# Patient Record
Sex: Male | Born: 1965 | Race: White | Hispanic: No | Marital: Married | Smoking: Former smoker
Health system: Southern US, Community
[De-identification: ages and names within clinical notes are randomized; demographics above are authoritative.]

## PROBLEM LIST (undated history)

## (undated) DIAGNOSIS — Z6841 Body Mass Index (BMI) 40.0 and over, adult: Secondary | ICD-10-CM

## (undated) DIAGNOSIS — L932 Other local lupus erythematosus: Secondary | ICD-10-CM

## (undated) DIAGNOSIS — I350 Nonrheumatic aortic (valve) stenosis: Secondary | ICD-10-CM

## (undated) DIAGNOSIS — I209 Angina pectoris, unspecified: Secondary | ICD-10-CM

## (undated) DIAGNOSIS — M25569 Pain in unspecified knee: Secondary | ICD-10-CM

## (undated) DIAGNOSIS — I10 Essential (primary) hypertension: Secondary | ICD-10-CM

## (undated) DIAGNOSIS — M7989 Other specified soft tissue disorders: Secondary | ICD-10-CM

## (undated) DIAGNOSIS — R011 Cardiac murmur, unspecified: Secondary | ICD-10-CM

## (undated) DIAGNOSIS — I2510 Atherosclerotic heart disease of native coronary artery without angina pectoris: Secondary | ICD-10-CM

## (undated) HISTORY — DX: Pain in unspecified knee: M25.569

## (undated) HISTORY — PX: WRIST SURGERY: SHX841

## (undated) HISTORY — DX: Other local lupus erythematosus: L93.2

## (undated) HISTORY — PX: FEMUR SURGERY: SHX943

## (undated) HISTORY — DX: Other specified soft tissue disorders: M79.89

## (undated) HISTORY — DX: Essential (primary) hypertension: I10

---

## 2018-03-15 DIAGNOSIS — M25569 Pain in unspecified knee: Secondary | ICD-10-CM | POA: Insufficient documentation

## 2018-03-15 DIAGNOSIS — I1A0 Resistant hypertension: Secondary | ICD-10-CM

## 2018-03-15 DIAGNOSIS — M7989 Other specified soft tissue disorders: Secondary | ICD-10-CM

## 2018-03-15 DIAGNOSIS — I10 Essential (primary) hypertension: Secondary | ICD-10-CM

## 2018-03-15 HISTORY — DX: Essential (primary) hypertension: I10

## 2018-03-15 HISTORY — DX: Other specified soft tissue disorders: M79.89

## 2018-03-15 HISTORY — DX: Pain in unspecified knee: M25.569

## 2018-03-15 HISTORY — DX: Resistant hypertension: I1A.0

## 2018-03-22 NOTE — Progress Notes (Signed)
Cardiology Office Note:    Date:  03/23/2018   ID:  Antonio Riley, DOB 05/27/1965, MRN [id number]  PCP:  Baldo Ash, FNP  Cardiologist:  Norman Herrlich, MD   Referring MD: Baldo Ash, FNP  ASSESSMENT:    1. Resistant hypertension   2. Nonrheumatic aortic valve stenosis   3. Swelling of lower leg   4. Screening cholesterol level    PLAN:    In order of problems listed above:  1. Poorly controlled switch to a more potent beta-blocker distal diuretic added routine labs checked.  Purchase a cuff to record blood pressure at home. 2. Physical examination clinical context suggest asymptomatic severe aortic stenosis likely congenital bicuspid with a history of murmur 3 years ago.  Echocardiogram ordered also at risk for hypertrophic cardiomyopathy initiate endocarditis prophylaxis  Next   3 weeks   Medication Adjustments/Labs and Tests Ordered: Current medicines are reviewed at length with the patient today.  Concerns regarding medicines are outlined above.  No orders of the defined types were placed in this encounter.  No orders of the defined types were placed in this encounter.    Chief Complaint  Patient presents with  . Heart Murmur    History of Present Illness:    Antonio Riley is a 52 y.o. male who is being seen today for the evaluation of hypertension at the request of Baldo Ash, FNP. 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  During the years he tells me his blood pressure is never been controlled and typically runs in the range of 170 /110.  Is told 3 years ago he had a heart murmur but he is never had evaluation fortunately is asymptomatic no exercise intolerance chest pain dyspnea or syncope he has no edema orthopnea.  He has a family history of valvular heart disease but it is his stepfather and he died after endocarditis and open heart surgery.  The goal examination suggest either  significant valvular aortic stenosis likely bicuspid or dynamic LV outflow outflow tract obstruction from hypertension.  Endocarditis prophylaxis echocardiogram ordered and I will alter his blood pressure medications as he is very poorly controlled he is resistant hypertension he has switch from week beta-blocker atenolol to potent carvedilol and I will add Spironolactone for aldosterone excess is had no lab work in over a year we will check baseline labs including a CBC renal function liver function lipid profile.  Past Medical History:  Diagnosis Date  . Hypertension 03/15/2018  . Knee pain 03/15/2018  . Swelling of lower leg 03/15/2018    Past Surgical History:  Procedure Laterality Date  . FEMUR SURGERY    . WRIST SURGERY      Current Medications: Current Meds  Medication Sig  . Calcium Carbonate-Vitamin D (CALCIUM 600/VITAMIN D PO) Take 1 tablet by mouth daily.  . hydrochlorothiazide (HYDRODIURIL) 25 MG tablet Take 25 mg by mouth every morning.  Marland Kitchen lisinopril (PRINIVIL,ZESTRIL) 20 MG tablet Take 20 mg by mouth daily.  Marland Kitchen omeprazole (PRILOSEC OTC) 20 MG tablet Take 20 mg by mouth daily.  Marland Kitchen OVER THE COUNTER MEDICATION Take 1 tablet by mouth daily. Allergy Medicine  . [DISCONTINUED] atenolol (TENORMIN) 100 MG tablet Take 100 mg by mouth daily.     Allergies:   Coricidin hbp cough-cold [chlorpheniramine-dm]   Social History   Socioeconomic History  . Marital status: Married    Spouse name: Not on file  . Number of  children: Not on file  . Years of education: Not on file  . Highest education level: Not on file  Occupational History  . Not on file  Social Needs  . Financial resource strain: Not on file  . Food insecurity:    Worry: Not on file    Inability: Not on file  . Transportation needs:    Medical: Not on file    Non-medical: Not on file  Tobacco Use  . Smoking status: Current Every Day Smoker    Packs/day: 1.00    Years: 28.00    Pack years: 28.00    Types:  Cigarettes  . Smokeless tobacco: Never Used  Substance and Sexual Activity  . Alcohol use: Yes    Comment: occ beer   . Drug use: Not Currently  . Sexual activity: Not on file  Lifestyle  . Physical activity:    Days per week: Not on file    Minutes per session: Not on file  . Stress: Not on file  Relationships  . Social connections:    Talks on phone: Not on file    Gets together: Not on file    Attends religious service: Not on file    Active member of club or organization: Not on file    Attends meetings of clubs or organizations: Not on file    Relationship status: Not on file  Other Topics Concern  . Not on file  Social History Narrative  . Not on file     Family History: The patient's family history includes CAD in his mother; Deep vein thrombosis in his mother; Diabetes in his cousin and mother; Heart murmur in his mother.  ROS:   Review of Systems  Constitution: Negative.  HENT: Negative.   Eyes: Negative.   Cardiovascular: Negative.   Respiratory: Negative.   Endocrine: Negative.   Hematologic/Lymphatic: Negative.   Skin: Negative.   Musculoskeletal: Negative.   Gastrointestinal: Negative.   Genitourinary: Negative.   Neurological: Negative.   Psychiatric/Behavioral: Negative.   Allergic/Immunologic: Negative.    Please see the history of present illness.     All other systems reviewed and are negative.  EKGs/Labs/Other Studies Reviewed:    The following studies were reviewed today:   EKG:  EKG is  ordered today.  The ekg ordered today demonstrates SRTH LVH and repolarization  Recent Labs: No results found for requested labs within last 8760 hours.  Recent Lipid Panel No results found for: CHOL, TRIG, HDL, CHOLHDL, VLDL, LDLCALC, LDLDIRECT  Physical Exam:    VS:  BP (!) 202/108 (BP Location: Left Arm, Patient Position: Sitting, Cuff Size: Large)   Pulse (!) 53   Ht 6\' 1"  (1.854 m)   Wt (!) 337 lb 6.4 oz (153 kg)   SpO2 98%   BMI 44.51 kg/m      Wt Readings from Last 3 Encounters:  03/23/18 (!) 337 lb 6.4 oz (153 kg)     GEN:  Well nourished, well developed in no acute distress HEENT: Normal NECK: No JVD; No carotid bruits LYMPHATICS: No lymphadenopathy CARDIAC: There is a grade 3/6 harsh grunting Solik murmur it runs from the apex through the aortic area into the right innominate and subclavian and up into the carotids S2 is single peaks very late all the hallmarks of severe aortic stenosis or left ventricular outflow tract obstruction dynamic.  RRR, no rubs, gallops RESPIRATORY:  Clear to auscultation without rales, wheezing or rhonchi  ABDOMEN: Soft, non-tender, non-distended MUSCULOSKELETAL:  No  edema; No deformity  SKIN: Warm and dry NEUROLOGIC:  Alert and oriented x 3 PSYCHIATRIC:  Normal affect     Signed, Norman Herrlich, MD  03/23/2018 10:42 AM    [facility]

## 2018-03-23 ENCOUNTER — Encounter: Payer: Self-pay | Admitting: Cardiology

## 2018-03-23 ENCOUNTER — Ambulatory Visit: Payer: 59 | Admitting: Cardiology

## 2018-03-23 VITALS — BP 202/108 | HR 53 | Ht 73.0 in | Wt 337.4 lb

## 2018-03-23 DIAGNOSIS — Z13220 Encounter for screening for lipoid disorders: Secondary | ICD-10-CM

## 2018-03-23 DIAGNOSIS — I350 Nonrheumatic aortic (valve) stenosis: Secondary | ICD-10-CM

## 2018-03-23 DIAGNOSIS — I10 Essential (primary) hypertension: Secondary | ICD-10-CM | POA: Diagnosis not present

## 2018-03-23 DIAGNOSIS — Z6841 Body Mass Index (BMI) 40.0 and over, adult: Secondary | ICD-10-CM | POA: Insufficient documentation

## 2018-03-23 MED ORDER — SPIRONOLACTONE 25 MG PO TABS: 25.0000 mg | ORAL_TABLET | Freq: Every day | ORAL | 3 refills | Status: DC

## 2018-03-23 MED ORDER — AMOXICILLIN 500 MG PO CAPS: ORAL_CAPSULE | ORAL | 0 refills | Status: DC

## 2018-03-23 MED ORDER — CARVEDILOL 12.5 MG PO TABS: 12.5000 mg | ORAL_TABLET | Freq: Two times a day (BID) | ORAL | 3 refills | Status: DC

## 2018-03-23 NOTE — Patient Instructions (Addendum)
Medication Instructions:  Your physician has recommended you make the following change in your medication:  STOP atenolol  START amoxicillin 500 mg: Take 4 capsules (2,000 mg) 45 minutes before dental procedures START carvedilol (coreg) 12.5 mg: Take 1 tablet twice daily START spironolactone (aldactone) 25 mg: Take 1 tablet daily   If you need a refill on your cardiac medications before your next appointment, please call your pharmacy.   Lab work: Your physician recommends that you return for lab work today: CMP, Lipid panel, CBC.   If you have labs (blood work) drawn today and your tests are completely normal, you will receive your results only by: Marland Kitchen MyChart Message (if you have MyChart) OR . A paper copy in the mail If you have any lab test that is abnormal or we need to change your treatment, we will call you to review the results.  Testing/Procedures: You had an EKG today.   Your physician has requested that you have an echocardiogram. Echocardiography is a painless test that uses sound waves to create images of your heart. It provides your doctor with information about the size and shape of your heart and how well your heart's chambers and valves are working. This procedure takes approximately one hour. There are no restrictions for this procedure.  Follow-Up: At [facility], you and your health needs are our priority.  As part of our continuing mission to provide you with exceptional heart care, we have created designated Provider Care Teams.  These Care Teams include your primary Cardiologist (physician) and Advanced Practice Providers (APPs -  Physician Assistants and Nurse Practitioners) who all work together to provide you with the care you need, when you need it. You will need a follow up appointment in 3 weeks.      DASH diet: Healthy eating to lower your blood pressure The DASH diet emphasizes portion size, eating a variety of foods and getting the right amount of  nutrients. Discover how DASH can improve your health and lower your blood pressure. By [facility] Staff  DASH stands for Dietary Approaches to Stop Hypertension. The DASH diet is a lifelong approach to healthy eating that's designed to help treat or prevent high blood pressure (hypertension). The DASH diet encourages you to reduce the sodium in your diet and eat a variety of foods rich in nutrients that help lower blood pressure, such as potassium, calcium and magnesium. By following the DASH diet, you may be able to reduce your blood pressure by a few points in just two weeks. Over time, your systolic blood pressure could drop by eight to 14 points, which can make a significant difference in your health risks. Because the DASH diet is a healthy way of eating, it offers health benefits besides just lowering blood pressure. The DASH diet is also in line with dietary recommendations to prevent osteoporosis, cancer, heart disease, stroke and diabetes. DASH diet: Sodium levels The DASH diet emphasizes vegetables, fruits and low-fat dairy foods - and moderate amounts of whole grains, fish, poultry and nuts. In addition to the standard DASH diet, there is also a lower sodium version of the diet. You can choose the version of the diet that meets your health needs: Standard DASH diet. You can consume up to 2,300 milligrams (mg) of sodium a day.  Lower sodium DASH diet. You can consume up to 1,500 mg of sodium a day. Both versions of the DASH diet aim to reduce the amount of sodium in your diet compared with  what you might get in a typical American diet, which can amount to a whopping 3,400 mg of sodium a day or more. The standard DASH diet meets the recommendation from the Dietary Guidelines for Americans to keep daily sodium intake to less than 2,300 mg a day. The American Heart Association recommends 1,500 mg a day of sodium as an upper limit for all adults. If you aren't sure what sodium level is right for  you, talk to your doctor. DASH diet: What to eat Both versions of the DASH diet include lots of whole grains, fruits, vegetables and low-fat dairy products. The DASH diet also includes some fish, poultry and legumes, and encourages a small amount of nuts and seeds a few times a week.  You can eat red meat, sweets and fats in small amounts. The DASH diet is low in saturated fat, cholesterol and total fat. Here's a look at the recommended servings from each food group for the 2,000-calorie-a-day DASH diet. Grains: 6 to 8 servings a day Grains include bread, cereal, rice and pasta. Examples of one serving of grains include 1 slice whole-wheat bread, 1 ounce dry cereal, or 1/2 cup cooked cereal, rice or pasta. Focus on whole grains because they have more fiber and nutrients than do refined grains. For instance, use brown rice instead of white rice, whole-wheat pasta instead of regular pasta and whole-grain bread instead of white bread. Look for products labeled "100 percent whole grain" or "100 percent whole wheat."  Grains are naturally low in fat. Keep them this way by avoiding butter, cream and cheese sauces. Vegetables: 4 to 5 servings a day Tomatoes, carrots, broccoli, sweet potatoes, greens and other vegetables are full of fiber, vitamins, and such minerals as potassium and magnesium. Examples of one serving include 1 cup raw leafy green vegetables or 1/2 cup cut-up raw or cooked vegetables. Don't think of vegetables only as side dishes - a hearty blend of vegetables served over brown rice or whole-wheat noodles can serve as the main dish for a meal.  Fresh and frozen vegetables are both good choices. When buying frozen and canned vegetables, choose those labeled as low sodium or without added salt.  To increase the number of servings you fit in daily, be creative. In a stir-fry, for instance, cut the amount of meat in half and double up on the vegetables. Fruits: 4 to 5 servings a day Many fruits  need little preparation to become a healthy part of a meal or snack. Like vegetables, they're packed with fiber, potassium and magnesium and are typically low in fat - coconuts are an exception. Examples of one serving include one medium fruit, 1/2 cup fresh, frozen or canned fruit, or 4 ounces of juice. Have a piece of fruit with meals and one as a snack, then round out your day with a dessert of fresh fruits topped with a dollop of low-fat yogurt.  Leave on edible peels whenever possible. The peels of apples, pears and most fruits with pits add interesting texture to recipes and contain healthy nutrients and fiber.  Remember that citrus fruits and juices, such as grapefruit, can interact with certain medications, so check with your doctor or pharmacist to see if they're OK for you.  If you choose canned fruit or juice, make sure no sugar is added. Dairy: 2 to 3 servings a day Milk, yogurt, cheese and other dairy products are major sources of calcium, vitamin D and protein. But the key is to make sure  that you choose dairy products that are low fat or fat-free because otherwise they can be a major source of fat - and most of it is saturated. Examples of one serving include 1 cup skim or 1 percent milk, 1 cup low fat yogurt, or 1 1/2 ounces part-skim cheese. Low-fat or fat-free frozen yogurt can help you boost the amount of dairy products you eat while offering a sweet treat. Add fruit for a healthy twist.  If you have trouble digesting dairy products, choose lactose-free products or consider taking an over-the-counter product that contains the enzyme lactase, which can reduce or prevent the symptoms of lactose intolerance.  Go easy on regular and even fat-free cheeses because they are typically high in sodium. Lean meat, poultry and fish: 6 servings or fewer a day Meat can be a rich source of protein, B vitamins, iron and zinc. Choose lean varieties and aim for no more than 6 ounces a day. Cutting back  on your meat portion will allow room for more vegetables. Trim away skin and fat from poultry and meat and then bake, broil, grill or roast instead of frying in fat.  Eat heart-healthy fish, such as salmon, herring and tuna. These types of fish are high in omega-3 fatty acids, which can help lower your total cholesterol. Nuts, seeds and legumes: 4 to 5 servings a week Almonds, sunflower seeds, kidney beans, peas, lentils and other foods in this family are good sources of magnesium, potassium and protein. They're also full of fiber and phytochemicals, which are plant compounds that may protect against some cancers and cardiovascular disease. Serving sizes are small and are intended to be consumed only a few times a week because these foods are high in calories. Examples of one serving include 1/3 cup nuts, 2 tablespoons seeds, or 1/2 cup cooked beans or peas.  Nuts sometimes get a bad rap because of their fat content, but they contain healthy types of fat - monounsaturated fat and omega-3 fatty acids. They're high in calories, however, so eat them in moderation. Try adding them to stir-fries, salads or cereals.  Soybean-based products, such as tofu and tempeh, can be a good alternative to meat because they contain all of the amino acids your body needs to make a complete protein, just like meat. Fats and oils: 2 to 3 servings a day Fat helps your body absorb essential vitamins and helps your body's immune system. But too much fat increases your risk of heart disease, diabetes and obesity. The DASH diet strives for a healthy balance by limiting total fat to less than 30 percent of daily calories from fat, with a focus on the healthier monounsaturated fats. Examples of one serving include 1 teaspoon soft margarine, 1 tablespoon mayonnaise or 2 tablespoons salad dressing. Saturated fat and trans fat are the main dietary culprits in increasing your risk of coronary artery disease. DASH helps keep your daily  saturated fat to less than 6 percent of your total calories by limiting use of meat, butter, cheese, whole milk, cream and eggs in your diet, along with foods made from lard, solid shortenings, and palm and coconut oils.  Avoid trans fat, commonly found in such processed foods as crackers, baked goods and fried items.  Read food labels on margarine and salad dressing so that you can choose those that are lowest in saturated fat and free of trans fat. Sweets: 5 servings or fewer a week You don't have to banish sweets entirely while following the DASH  diet - just go easy on them. Examples of one serving include 1 tablespoon sugar, jelly or jam, 1/2 cup sorbet, or 1 cup lemonade. When you eat sweets, choose those that are fat-free or low-fat, such as sorbets, fruit ices, jelly beans, hard candy, graham crackers or low-fat cookies.  Artificial sweeteners such as aspartame (NutraSweet, Equal) and sucralose (Splenda) may help satisfy your sweet tooth while sparing the sugar. But remember that you still must use them sensibly. It's OK to swap a diet cola for a regular cola, but not in place of a more nutritious beverage such as low-fat milk or even plain water.  Cut back on added sugar, which has no nutritional value but can pack on calories. DASH diet: Alcohol and caffeine Drinking too much alcohol can increase blood pressure. The Dietary Guidelines for Americans recommends that men limit alcohol to no more than two drinks a day and women to one or less. The DASH diet doesn't address caffeine consumption. The influence of caffeine on blood pressure remains unclear. But caffeine can cause your blood pressure to rise at least temporarily. If you already have high blood pressure or if you think caffeine is affecting your blood pressure, talk to your doctor about your caffeine consumption. DASH diet and weight loss While the DASH diet is not a weight-loss program, you may indeed lose unwanted pounds because it can  help guide you toward healthier food choices. The DASH diet generally includes about 2,000 calories a day. If you're trying to lose weight, you may need to eat fewer calories. You may also need to adjust your serving goals based on your individual circumstances - something your health care team can help you decide. Tips to cut back on sodium The foods at the core of the DASH diet are naturally low in sodium. So just by following the DASH diet, you're likely to reduce your sodium intake. You also reduce sodium further by: Using sodium-free spices or flavorings with your food instead of salt  Not adding salt when cooking rice, pasta or hot cereal  Rinsing canned foods to remove some of the sodium  Buying foods labeled "no salt added," "sodium-free," "low sodium" or "very low sodium" One teaspoon of table salt has 2,325 mg of sodium. When you read food labels, you may be surprised at just how much sodium some processed foods contain. Even low-fat soups, canned vegetables, ready-to-eat cereals and sliced [place] from the local deli - foods you may have considered healthy - often have lots of sodium. You may notice a difference in taste when you choose low-sodium food and beverages. If things seem too bland, gradually introduce low-sodium foods and cut back on table salt until you reach your sodium goal. That'll give your palate time to adjust. Using salt-free seasoning blends or herbs and spices may also ease the transition. It can take several weeks for your taste buds to get used to less salty foods. Putting the pieces of the DASH diet together Try these strategies to get started on the DASH diet:  Change gradually. If you now eat only one or two servings of fruits or vegetables a day, try to add a serving at lunch and one at dinner. Rather than switching to all whole grains, start by making one or two of your grain servings whole grains. Increasing fruits, vegetables and whole grains gradually can also  help prevent bloating or diarrhea that may occur if you aren't used to eating a diet with lots of fiber.  You can also try over-the-counter products to help reduce gas from beans and vegetables.  Reward successes and forgive slip-ups. Reward yourself with a nonfood treat for your accomplishments - rent a movie, purchase a book or get together with a friend. Everyone slips, especially when learning something new. Remember that changing your lifestyle is a long-term process. Find out what triggered your setback and then just pick up where you left off with the DASH diet.  Add physical activity. To boost your blood pressure lowering efforts even more, consider increasing your physical activity in addition to following the DASH diet. Combining both the DASH diet and physical activity makes it more likely that you'll reduce your blood pressure.  Get support if you need it. If you're having trouble sticking to your diet, talk to your doctor or dietitian about it. You might get some tips that will help you stick to the DASH diet. Remember, healthy eating isn't an all-or-nothing proposition. What's most important is that, on average, you eat healthier foods with plenty of variety - both to keep your diet nutritious and to avoid boredom or extremes. And with the DASH diet, you can have both.     Echocardiogram An echocardiogram, or echocardiography, uses sound waves (ultrasound) to produce an image of your heart. The echocardiogram is simple, painless, obtained within a short period of time, and offers valuable information to your health care provider. The images from an echocardiogram can provide information such as:  Evidence of coronary artery disease (CAD).  Heart size.  Heart muscle function.  Heart valve function.  Aneurysm detection.  Evidence of a past heart attack.  Fluid buildup around the heart.  Heart muscle thickening.  Assess heart valve function.  Tell a health care provider  about:  Any allergies you have.  All medicines you are taking, including vitamins, herbs, eye drops, creams, and over-the-counter medicines.  Any problems you or family members have had with anesthetic medicines.  Any blood disorders you have.  Any surgeries you have had.  Any medical conditions you have.  Whether you are pregnant or may be pregnant. What happens before the procedure? No special preparation is needed. Eat and drink normally. What happens during the procedure?  In order to produce an image of your heart, gel will be applied to your chest and a wand-like tool (transducer) will be moved over your chest. The gel will help transmit the sound waves from the transducer. The sound waves will harmlessly bounce off your heart to allow the heart images to be captured in real-time motion. These images will then be recorded.  You may need an IV to receive a medicine that improves the quality of the pictures. What happens after the procedure? You may return to your normal schedule including diet, activities, and medicines, unless your health care provider tells you otherwise. This information is not intended to replace advice given to you by your health care provider. Make sure you discuss any questions you have with your health care provider. Document Released: 04/30/2000 Document Revised: 12/20/2015 Document Reviewed: 01/08/2013 Elsevier Interactive Patient Education  2017 [employer].

## 2018-03-24 LAB — COMPREHENSIVE METABOLIC PANEL
ALT: 29 IU/L (ref 0–44)
AST: 26 IU/L (ref 0–40)
Albumin/Globulin Ratio: 1.5 (ref 1.2–2.2)
Albumin: 4.3 g/dL (ref 3.5–5.5)
Alkaline Phosphatase: 50 IU/L (ref 39–117)
BILIRUBIN TOTAL: 0.8 mg/dL (ref 0.0–1.2)
BUN/Creatinine Ratio: 13 (ref 9–20)
BUN: 15 mg/dL (ref 6–24)
CALCIUM: 9.4 mg/dL (ref 8.7–10.2)
CHLORIDE: 97 mmol/L (ref 96–106)
CO2: 23 mmol/L (ref 20–29)
Creatinine, Ser: 1.12 mg/dL (ref 0.76–1.27)
GFR, EST AFRICAN AMERICAN: 87 mL/min/{1.73_m2} (ref >59)
GFR, EST NON AFRICAN AMERICAN: 76 mL/min/{1.73_m2} (ref >59)
GLUCOSE: 105 mg/dL — AB (ref 65–99)
Globulin, Total: 2.9 g/dL (ref 1.5–4.5)
Potassium: 5 mmol/L (ref 3.5–5.2)
Sodium: 136 mmol/L (ref 134–144)
TOTAL PROTEIN: 7.2 g/dL (ref 6.0–8.5)

## 2018-03-24 LAB — CBC
Hematocrit: 46.2 % (ref 37.5–51.0)
Hemoglobin: 15.3 g/dL (ref 13.0–17.7)
MCH: 29.2 pg (ref 26.6–33.0)
MCHC: 33.1 g/dL (ref 31.5–35.7)
MCV: 88 fL (ref 79–97)
Platelets: 271 10*3/uL (ref 150–450)
RBC: 5.24 x10E6/uL (ref 4.14–5.80)
RDW: 12.4 % (ref 12.3–15.4)
WBC: 8 10*3/uL (ref 3.4–10.8)

## 2018-03-24 LAB — LIPID PANEL
Chol/HDL Ratio: 4.2 ratio (ref 0.0–5.0)
Cholesterol, Total: 159 mg/dL (ref 100–199)
HDL: 38 mg/dL — AB (ref >39)
LDL Calculated: 103 mg/dL — ABNORMAL HIGH (ref 0–99)
TRIGLYCERIDES: 90 mg/dL (ref 0–149)
VLDL CHOLESTEROL CAL: 18 mg/dL (ref 5–40)

## 2018-03-27 ENCOUNTER — Ambulatory Visit (HOSPITAL_BASED_OUTPATIENT_CLINIC_OR_DEPARTMENT_OTHER): Payer: 59

## 2018-03-30 ENCOUNTER — Other Ambulatory Visit: Payer: Self-pay | Admitting: Cardiology

## 2018-03-30 MED ORDER — HYDROCHLOROTHIAZIDE 25 MG PO TABS: 25.0000 mg | ORAL_TABLET | Freq: Every morning | ORAL | 1 refills | Status: DC

## 2018-03-30 MED ORDER — LISINOPRIL 20 MG PO TABS: 20.0000 mg | ORAL_TABLET | Freq: Every day | ORAL | 1 refills | Status: DC

## 2018-03-30 NOTE — Telephone Encounter (Signed)
° ° °  1. Which medications need to be refilled? (please list name of each medication and dose if known) lisinopril 20mg ,  Hydrochlorothiazide 25mg   2. Which pharmacy/location (including street and city if local pharmacy) is medication to be sent to? [facility] in [place]  3. Do they need a 30 day or 90 day supply? 90

## 2018-03-30 NOTE — Telephone Encounter (Signed)
Hydrochlorothiazide 25 mg daily and lisinopril 20 mg daily refilled

## 2018-04-24 NOTE — Progress Notes (Deleted)
Cardiology Office Note:    Date:  04/24/2018   ID:  Antonio Riley, DOB [birth year], MRN [id number]  PCP:  Baldo Ash, FNP  Cardiologist:  Norman Herrlich, MD    Referring MD: Baldo Ash, FNP    ASSESSMENT:    No diagnosis found. PLAN:    In order of problems listed above:  1. ***   Next appointment: ***   Medication Adjustments/Labs and Tests Ordered: Current medicines are reviewed at length with the patient today.  Concerns regarding medicines are outlined above.  No orders of the defined types were placed in this encounter.  No orders of the defined types were placed in this encounter.   No chief complaint on file.   History of Present Illness:   1 Antonio Riley is a 52 y.o. male with a hx of uncontrolled hypertension and aortic murmur last seen one month ago. Compliance with diet, lifestyle and medications: *** Past Medical History:  Diagnosis Date  . Hypertension 03/15/2018  . Knee pain 03/15/2018  . Swelling of lower leg 03/15/2018    Past Surgical History:  Procedure Laterality Date  . FEMUR SURGERY    . WRIST SURGERY      Current Medications: No outpatient medications have been marked as taking for the 04/25/18 encounter (Appointment) with Baldo Daub, MD.     Allergies:   Coricidin hbp cough-cold [chlorpheniramine-dm]   Social History   Socioeconomic History  . Marital status: Married    Spouse name: Not on file  . Number of children: Not on file  . Years of education: Not on file  . Highest education level: Not on file  Occupational History  . Not on file  Social Needs  . Financial resource strain: Not on file  . Food insecurity:    Worry: Not on file    Inability: Not on file  . Transportation needs:    Medical: Not on file    Non-medical: Not on file  Tobacco Use  . Smoking status: Current Every Day Smoker    Packs/day: 1.00    Years: 28.00    Pack years: 28.00    Types: Cigarettes  . Smokeless tobacco:  Never Used  Substance and Sexual Activity  . Alcohol use: Yes    Comment: occ beer   . Drug use: Not Currently  . Sexual activity: Not on file  Lifestyle  . Physical activity:    Days per week: Not on file    Minutes per session: Not on file  . Stress: Not on file  Relationships  . Social connections:    Talks on phone: Not on file    Gets together: Not on file    Attends religious service: Not on file    Active member of club or organization: Not on file    Attends meetings of clubs or organizations: Not on file    Relationship status: Not on file  Other Topics Concern  . Not on file  Social History Narrative  . Not on file     Family History: The patient's ***family history includes CAD in his mother; Deep vein thrombosis in his mother; Diabetes in his cousin and mother; Heart murmur in his mother. ROS:   Please see the history of present illness.    All other systems reviewed and are negative.  EKGs/Labs/Other Studies Reviewed:    The following studies were reviewed today:  EKG:  EKG ordered today.  The ekg ordered today demonstrates ***  Recent  Labs: 03/23/2018: ALT 29; BUN 15; Creatinine, Ser 1.12; Hemoglobin 15.3; Platelets 271; Potassium 5.0; Sodium 136  Recent Lipid Panel    Component Value Date/Time   CHOL 159 03/23/2018 1104   TRIG 90 03/23/2018 1104   HDL 38 (L) 03/23/2018 1104   CHOLHDL 4.2 03/23/2018 1104   LDLCALC 103 (H) 03/23/2018 1104    Physical Exam:    VS:  There were no vitals taken for this visit.    Wt Readings from Last 3 Encounters:  03/23/18 (!) 337 lb 6.4 oz (153 kg)     GEN: *** Well nourished, well developed in no acute distress HEENT: Normal NECK: No JVD; No carotid bruits LYMPHATICS: No lymphadenopathy CARDIAC: ***RRR, no murmurs, rubs, gallops RESPIRATORY:  Clear to auscultation without rales, wheezing or rhonchi  ABDOMEN: Soft, non-tender, non-distended MUSCULOSKELETAL:  No edema; No deformity  SKIN: Warm and  dry NEUROLOGIC:  Alert and oriented x 3 PSYCHIATRIC:  Normal affect    Signed, Norman HerrlichBrian Sharonann Malbrough, MD  04/24/2018 12:34 PM    [facility]

## 2018-04-25 ENCOUNTER — Ambulatory Visit: Payer: 59 | Admitting: Cardiology

## 2018-04-25 DIAGNOSIS — R0989 Other specified symptoms and signs involving the circulatory and respiratory systems: Secondary | ICD-10-CM

## 2018-05-25 ENCOUNTER — Ambulatory Visit: Payer: 59 | Admitting: Cardiology

## 2018-06-16 ENCOUNTER — Other Ambulatory Visit: Payer: Self-pay | Admitting: Cardiology

## 2018-06-17 ENCOUNTER — Other Ambulatory Visit: Payer: Self-pay | Admitting: Cardiology

## 2018-09-17 ENCOUNTER — Other Ambulatory Visit: Payer: Self-pay | Admitting: Cardiology

## 2018-09-18 ENCOUNTER — Other Ambulatory Visit: Payer: Self-pay | Admitting: Cardiology

## 2018-09-19 NOTE — Telephone Encounter (Signed)
Rx refill sent to pharmacy. Note to pharmacy: pt needs office visit before further refills.

## 2018-09-20 ENCOUNTER — Other Ambulatory Visit: Payer: Self-pay | Admitting: Cardiology

## 2018-10-11 ENCOUNTER — Telehealth: Payer: Self-pay

## 2018-10-11 ENCOUNTER — Other Ambulatory Visit: Payer: Self-pay | Admitting: Cardiology

## 2018-10-11 DIAGNOSIS — I10 Essential (primary) hypertension: Secondary | ICD-10-CM

## 2018-10-11 NOTE — Telephone Encounter (Signed)
Patient informed he must be seen in office for additional medication refills and that Dr. Dulce Sellar has ordered labs to be drawn.    Virtual visit scheduled 10/16/18 at 1050 am. Pt would like to get labs drawn at [facility] location and wonders if he could have an office visit instead of virtual visit on Monday.  Pt told will check with Dr. Dulce Sellar if office visit would be OK.  pls advise, tx

## 2018-10-13 NOTE — Progress Notes (Deleted)
{Choose 1 Note Type (Telehealth Visit or Telephone Visit):[phone]}   Date:  10/13/2018   ID:  Antonio Riley, DOB Sep 17, 1965, MRN [id number]  {Patient Location:[phone]::"Home"} {Provider Location:[phone]::"Home"}  PCP:  Baldo Ash, FNP  Cardiologist:  No primary care provider on file. *** Electrophysiologist:  None   Evaluation Performed:  {Choose Visit [phone]::"Follow-Up Visit"}  Chief Complaint:  ***  History of Present Illness:    Antonio Riley is a 53 y.o. male with resistant hypertension and a heart murmur last seen 03/24/19.  At the time of his last visit he was scheduled for an echocardiogram and his EF to be performed.  The patient {does/does not:200015} have symptoms concerning for COVID-19 infection (fever, chills, cough, or new shortness of breath).    Past Medical History:  Diagnosis Date  . Hypertension 03/15/2018  . Knee pain 03/15/2018  . Swelling of lower leg 03/15/2018   Past Surgical History:  Procedure Laterality Date  . FEMUR SURGERY    . WRIST SURGERY       No outpatient medications have been marked as taking for the 10/16/18 encounter (Appointment) with Baldo Daub, MD.     Allergies:   Coricidin hbp cough-cold [chlorpheniramine-dm]   Social History   Tobacco Use  . Smoking status: Current Every Day Smoker    Packs/day: 1.00    Years: 28.00    Pack years: 28.00    Types: Cigarettes  . Smokeless tobacco: Never Used  Substance Use Topics  . Alcohol use: Yes    Comment: occ beer   . Drug use: Not Currently     Family Hx: The patient's family history includes CAD in his mother; Deep vein thrombosis in his mother; Diabetes in his cousin and mother; Heart murmur in his mother.  ROS:   Please see the history of present illness.    *** All other systems reviewed and are negative.   Prior CV studies:   The following studies were reviewed today:  ***  Labs/Other Tests and Data Reviewed:    EKG:  An  ECG dated 04/02/2018 was personally reviewed today and demonstrated:  Sinus rhythm inferior T wave abnormality  Recent Labs: 03/23/2018: ALT 29; BUN 15; Creatinine, Ser 1.12; Hemoglobin 15.3; Platelets 271; Potassium 5.0; Sodium 136   Recent Lipid Panel Lab Results  Component Value Date/Time   CHOL 159 03/23/2018 11:04 AM   TRIG 90 03/23/2018 11:04 AM   HDL 38 (L) 03/23/2018 11:04 AM   CHOLHDL 4.2 03/23/2018 11:04 AM   LDLCALC 103 (H) 03/23/2018 11:04 AM    Wt Readings from Last 3 Encounters:  03/23/18 (!) 337 lb 6.4 oz (153 kg)     Objective:    Vital Signs:  There were no vitals taken for this visit.   {HeartCare Virtual Exam (Optional):[phone]::"VITAL SIGNS:  reviewed"}  ASSESSMENT & PLAN:    1. ***  COVID-19 Education: The signs and symptoms of COVID-19 were discussed with the patient and how to seek care for testing (follow up with PCP or arrange E-visit).  ***The importance of social distancing was discussed today.  Time:   Today, I have spent *** minutes with the patient with telehealth technology discussing the above problems.     Medication Adjustments/Labs and Tests Ordered: Current medicines are reviewed at length with the patient today.  Concerns regarding medicines are outlined above.   Tests Ordered: No orders of the defined types were placed in this encounter.   Medication Changes: No orders of the defined types  were placed in this encounter.   Disposition:  Follow up {follow up:15908}  Signed, Norman HerrlichBrian , MD  10/13/2018 4:23 PM    [facility]

## 2018-10-16 ENCOUNTER — Telehealth: Payer: 59 | Admitting: Cardiology

## 2018-10-16 ENCOUNTER — Other Ambulatory Visit: Payer: Self-pay

## 2018-10-18 ENCOUNTER — Other Ambulatory Visit: Payer: Self-pay

## 2018-10-18 ENCOUNTER — Telehealth: Payer: Self-pay

## 2018-10-18 ENCOUNTER — Other Ambulatory Visit: Payer: Self-pay | Admitting: Cardiology

## 2018-10-18 MED ORDER — LISINOPRIL 20 MG PO TABS: 20.0000 mg | ORAL_TABLET | Freq: Every day | ORAL | 0 refills | Status: DC

## 2018-11-10 ENCOUNTER — Other Ambulatory Visit: Payer: Self-pay | Admitting: Cardiology

## 2018-11-10 NOTE — Telephone Encounter (Signed)
Spironolactone refill denied due to non-compliance with follow-up visits.

## 2018-12-11 ENCOUNTER — Other Ambulatory Visit: Payer: Self-pay | Admitting: Cardiology

## 2018-12-11 NOTE — Telephone Encounter (Signed)
Rx refill sent to pharmacy. 

## 2018-12-20 ENCOUNTER — Other Ambulatory Visit: Payer: Self-pay | Admitting: Cardiology

## 2019-01-18 ENCOUNTER — Other Ambulatory Visit: Payer: Self-pay | Admitting: Cardiology

## 2019-01-27 ENCOUNTER — Other Ambulatory Visit: Payer: Self-pay | Admitting: Cardiology

## 2019-02-06 ENCOUNTER — Other Ambulatory Visit: Payer: Self-pay | Admitting: Cardiology

## 2019-03-09 ENCOUNTER — Other Ambulatory Visit: Payer: Self-pay | Admitting: Cardiology

## 2019-03-13 ENCOUNTER — Other Ambulatory Visit: Payer: Self-pay | Admitting: Cardiology

## 2019-04-12 ENCOUNTER — Other Ambulatory Visit: Payer: Self-pay | Admitting: Cardiology

## 2019-04-16 NOTE — Telephone Encounter (Signed)
Hctz and Spironolactone 2 week refills sent. Overdue for follow-up visit.

## 2019-04-17 ENCOUNTER — Other Ambulatory Visit: Payer: Self-pay | Admitting: Cardiology

## 2019-04-29 NOTE — Progress Notes (Signed)
Cardiology Office Note:    Date:  04/30/2019   ID:  Antonio Riley, DOB 12-Apr-1966, MRN [id number]  PCP:  Sarajane Jews, FNP  Cardiologist:  Shirlee More, MD    Referring MD: Sarajane Jews, FNP    ASSESSMENT:    1. Resistant hypertension   2. Heart murmur, systolic    PLAN:    In order of problems listed above:  1. Improved we will resume his beta-blocker should be at target then check renal function lipid profile. 2. He needs an echocardiogram exam is consistent with moderate aortic stenosis   Next appointment: 6 months   Medication Adjustments/Labs and Tests Ordered: Current medicines are reviewed at length with the patient today.  Concerns regarding medicines are outlined above.  No orders of the defined types were placed in this encounter.  No orders of the defined types were placed in this encounter.   Chief Complaint  Patient presents with  . Follow-up  . Hypertension    And medication refills    History of Present Illness:    Antonio Riley is a 53 y.o. male with a hx of resistant hypertension and murmur of aortic stenosis last seen 03/23/2018.  Seen [facility] ED 07/17/2018 with rectal bleeding.  He was discharged for outpatient care Compliance with diet, lifestyle and medications: Yes  Last year he did not do the echocardiogram due to cost.  [facility] and will try to schedule in the freestanding [place] office hopefully at a different price structure.  No chest pain edema shortness of breath palpitation or syncope.  He has been off his beta-blocker for a month blood pressure running in the range of 160/80.  Resume his beta-blocker check labs and echocardiogram has been ordered he has a murmur suggesting aortic stenosis but is asymptomatic. Past Medical History:  Diagnosis Date  . Hypertension 03/15/2018  . Knee pain 03/15/2018  . Swelling of lower leg 03/15/2018    Past Surgical History:  Procedure Laterality Date    . FEMUR SURGERY    . WRIST SURGERY      Current Medications: Current Meds  Medication Sig  . Calcium Carbonate-Vitamin D (CALCIUM 600/VITAMIN D PO) Take 1 tablet by mouth daily.  . hydrochlorothiazide (HYDRODIURIL) 25 MG tablet Take 1 tablet (25 mg total) by mouth daily. NO ADDITIONAL REFILLS UNTIL SEEN BY PROVIDER  . lisinopril (ZESTRIL) 20 MG tablet Take 1 tablet (20 mg total) by mouth daily.  Marland Kitchen omeprazole (PRILOSEC OTC) 20 MG tablet Take 20 mg by mouth daily.  Marland Kitchen OVER THE COUNTER MEDICATION Take 1 tablet by mouth daily. Allergy Medicine  . spironolactone (ALDACTONE) 25 MG tablet Take 1 tablet (25 mg total) by mouth daily. NO MORE REFILLS UNTIL SEEN BY PROVIDER     Allergies:   Coricidin hbp cough-cold [chlorpheniramine-dm]   Social History   Socioeconomic History  . Marital status: Married    Spouse name: Not on file  . Number of children: Not on file  . Years of education: Not on file  . Highest education level: Not on file  Occupational History  . Not on file  Tobacco Use  . Smoking status: Current Every Day Smoker    Packs/day: 1.00    Years: 28.00    Pack years: 28.00    Types: Cigarettes  . Smokeless tobacco: Never Used  Substance and Sexual Activity  . Alcohol use: Yes    Comment: occ beer   . Drug use: Not Currently  .  Sexual activity: Not on file  Other Topics Concern  . Not on file  Social History Narrative  . Not on file   Social Determinants of Health   Financial Resource Strain:   . Difficulty of Paying Living Expenses: Not on file  Food Insecurity:   . Worried About Charity fundraiser in the Last Year: Not on file  . Ran Out of Food in the Last Year: Not on file  Transportation Needs:   . Lack of Transportation (Medical): Not on file  . Lack of Transportation (Non-Medical): Not on file  Physical Activity:   . Days of Exercise per Week: Not on file  . Minutes of Exercise per Session: Not on file  Stress:   . Feeling of Stress : Not on file   Social Connections:   . Frequency of Communication with Friends and Family: Not on file  . Frequency of Social Gatherings with Friends and Family: Not on file  . Attends Religious Services: Not on file  . Active Member of Clubs or Organizations: Not on file  . Attends Archivist Meetings: Not on file  . Marital Status: Not on file     Family History: The patient's family history includes CAD in his mother; Deep vein thrombosis in his mother; Diabetes in his cousin and mother; Heart murmur in his mother. ROS:   Please see the history of present illness.    All other systems reviewed and are negative.  EKGs/Labs/Other Studies Reviewed:    The following studies were reviewed today:  EKG:  EKG ordered today and personally reviewed.  The ekg ordered today demonstrates sinus rhythm 2 APC otherwise normal  Recent Labs: 07/17/2018: Hemoglobin 16.1 count 242,000 potassium 4.0 creatinine 1.11 ESR 76 cc/min Recent Lipid Panel    Component Value Date/Time   CHOL 159 03/23/2018 1104   TRIG 90 03/23/2018 1104   HDL 38 (L) 03/23/2018 1104   CHOLHDL 4.2 03/23/2018 1104   LDLCALC 103 (H) 03/23/2018 1104    Physical Exam:    VS:  BP (!) 162/86   Pulse 85   Ht _0  (1.854 m)   Wt (!) 319 lb (144.7 kg)   BMI 42.09 kg/m     Wt Readings from Last 3 Encounters:  04/30/19 (!) 319 lb (144.7 kg)  03/23/18 (!) 337 lb 6.4 oz (153 kg)     GEN:  Well nourished, well developed in no acute distress HEENT: Normal NECK: No JVD; No carotid bruits LYMPHATICS: No lymphadenopathy CARDIAC: RRR, grade 2/6 to 3/6 systolic ejection murmur harsh seems to encompass S2 radiates to the base of the carotids no AR, rubs, gallops RESPIRATORY:  Clear to auscultation without rales, wheezing or rhonchi  ABDOMEN: Soft, non-tender, non-distended MUSCULOSKELETAL:  No edema; No deformity  SKIN: Warm and dry NEUROLOGIC:  Alert and oriented x 3 PSYCHIATRIC:  Normal affect    Signed, Shirlee More, MD   04/30/2019 11:51 AM    [place]

## 2019-04-30 ENCOUNTER — Encounter: Payer: Self-pay | Admitting: Cardiology

## 2019-04-30 ENCOUNTER — Ambulatory Visit (INDEPENDENT_AMBULATORY_CARE_PROVIDER_SITE_OTHER): Payer: 59 | Admitting: Cardiology

## 2019-04-30 ENCOUNTER — Other Ambulatory Visit: Payer: Self-pay

## 2019-04-30 VITALS — BP 162/86 | HR 85 | Ht 73.0 in | Wt 319.0 lb

## 2019-04-30 DIAGNOSIS — I10 Essential (primary) hypertension: Secondary | ICD-10-CM | POA: Diagnosis not present

## 2019-04-30 DIAGNOSIS — R011 Cardiac murmur, unspecified: Secondary | ICD-10-CM

## 2019-04-30 DIAGNOSIS — Z13220 Encounter for screening for lipoid disorders: Secondary | ICD-10-CM

## 2019-04-30 DIAGNOSIS — I350 Nonrheumatic aortic (valve) stenosis: Secondary | ICD-10-CM | POA: Diagnosis not present

## 2019-04-30 DIAGNOSIS — M7989 Other specified soft tissue disorders: Secondary | ICD-10-CM

## 2019-04-30 MED ORDER — LISINOPRIL 20 MG PO TABS: 20.0000 mg | ORAL_TABLET | Freq: Every day | ORAL | 1 refills | Status: DC

## 2019-04-30 MED ORDER — CARVEDILOL 25 MG PO TABS: 12.5000 mg | ORAL_TABLET | Freq: Two times a day (BID) | ORAL | 1 refills | Status: DC

## 2019-04-30 MED ORDER — SPIRONOLACTONE 25 MG PO TABS: 25.0000 mg | ORAL_TABLET | Freq: Every day | ORAL | 1 refills | Status: DC

## 2019-04-30 MED ORDER — HYDROCHLOROTHIAZIDE 25 MG PO TABS: 25.0000 mg | ORAL_TABLET | Freq: Every day | ORAL | 1 refills | Status: DC

## 2019-04-30 NOTE — Patient Instructions (Signed)
Medication Instructions:  Your physician has recommended you make the following change in your medication:   INCREASE carvedilol (coreg) 25 mg: Take 1 tablet twice daily  *If you need a refill on your cardiac medications before your next appointment, please call your pharmacy*  Lab Work: Your physician recommends that you return for lab work today: lipid panel, CMP, ProBNP.   If you have labs (blood work) drawn today and your tests are completely normal, you will receive your results only by: Marland Kitchen MyChart Message (if you have MyChart) OR . A paper copy in the mail If you have any lab test that is abnormal or we need to change your treatment, we will call you to review the results.  Testing/Procedures: You had an EKG today.   Your physician has requested that you have an echocardiogram. Echocardiography is a painless test that uses sound waves to create images of your heart. It provides your doctor with information about the size and shape of your heart and how well your heart's chambers and valves are working. This procedure takes approximately one hour. There are no restrictions for this procedure.  Follow-Up: At [facility], you and your health needs are our priority.  As part of our continuing mission to provide you with exceptional heart care, we have created designated Provider Care Teams.  These Care Teams include your primary Cardiologist (physician) and Advanced Practice Providers (APPs -  Physician Assistants and Nurse Practitioners) who all work together to provide you with the care you need, when you need it.  Your next appointment:   6 month(s)  The format for your next appointment:   In Person  Provider:   Shirlee More, MD    Echocardiogram An echocardiogram is a procedure that uses painless sound waves (ultrasound) to produce an image of the heart. Images from an echocardiogram can provide important information about:  Signs of coronary artery disease  (CAD).  Aneurysm detection. An aneurysm is a weak or damaged part of an artery wall that bulges out from the normal force of blood pumping through the body.  Heart size and shape. Changes in the size or shape of the heart can be associated with certain conditions, including heart failure, aneurysm, and CAD.  Heart muscle function.  Heart valve function.  Signs of a past heart attack.  Fluid buildup around the heart.  Thickening of the heart muscle.  A tumor or infectious growth around the heart valves. Tell a health care provider about:  Any allergies you have.  All medicines you are taking, including vitamins, herbs, eye drops, creams, and over-the-counter medicines.  Any blood disorders you have.  Any surgeries you have had.  Any medical conditions you have.  Whether you are pregnant or may be pregnant. What are the risks? Generally, this is a safe procedure. However, problems may occur, including:  Allergic reaction to dye (contrast) that may be used during the procedure. What happens before the procedure? No specific preparation is needed. You may eat and drink normally. What happens during the procedure?   An IV tube may be inserted into one of your veins.  You may receive contrast through this tube. A contrast is an injection that improves the quality of the pictures from your heart.  A gel will be applied to your chest.  A wand-like tool (transducer) will be moved over your chest. The gel will help to transmit the sound waves from the transducer.  The sound waves will harmlessly bounce off of  your heart to allow the heart images to be captured in real-time motion. The images will be recorded on a computer. The procedure may vary among health care providers and hospitals. What happens after the procedure?  You may return to your normal, everyday life, including diet, activities, and medicines, unless your health care provider tells you not to do  that. Summary  An echocardiogram is a procedure that uses painless sound waves (ultrasound) to produce an image of the heart.  Images from an echocardiogram can provide important information about the size and shape of your heart, heart muscle function, heart valve function, and fluid buildup around your heart.  You do not need to do anything to prepare before this procedure. You may eat and drink normally.  After the echocardiogram is completed, you may return to your normal, everyday life, unless your health care provider tells you not to do that. This information is not intended to replace advice given to you by your health care provider. Make sure you discuss any questions you have with your health care provider. Document Released: 04/30/2000 Document Revised: 08/24/2018 Document Reviewed: 06/05/2016 Elsevier Patient Education  2020 [employer].

## 2019-05-01 LAB — COMPREHENSIVE METABOLIC PANEL
ALT: 20 IU/L (ref 0–44)
AST: 21 IU/L (ref 0–40)
Albumin/Globulin Ratio: 1.5 (ref 1.2–2.2)
Albumin: 4.5 g/dL (ref 3.8–4.9)
Alkaline Phosphatase: 62 IU/L (ref 39–117)
BUN/Creatinine Ratio: 13 (ref 9–20)
BUN: 12 mg/dL (ref 6–24)
Bilirubin Total: 0.8 mg/dL (ref 0.0–1.2)
CO2: 18 mmol/L — ABNORMAL LOW (ref 20–29)
Calcium: 9.8 mg/dL (ref 8.7–10.2)
Chloride: 94 mmol/L — ABNORMAL LOW (ref 96–106)
Creatinine, Ser: 0.96 mg/dL (ref 0.76–1.27)
GFR calc Af Amer: 105 mL/min/{1.73_m2} (ref >59)
GFR calc non Af Amer: 91 mL/min/{1.73_m2} (ref >59)
Globulin, Total: 3.1 g/dL (ref 1.5–4.5)
Glucose: 97 mg/dL (ref 65–99)
Potassium: 4.2 mmol/L (ref 3.5–5.2)
Sodium: 131 mmol/L — ABNORMAL LOW (ref 134–144)
Total Protein: 7.6 g/dL (ref 6.0–8.5)

## 2019-05-01 LAB — LIPID PANEL
Chol/HDL Ratio: 3.8 ratio (ref 0.0–5.0)
Cholesterol, Total: 173 mg/dL (ref 100–199)
HDL: 45 mg/dL (ref >39)
LDL Chol Calc (NIH): 107 mg/dL — ABNORMAL HIGH (ref 0–99)
Triglycerides: 117 mg/dL (ref 0–149)
VLDL Cholesterol Cal: 21 mg/dL (ref 5–40)

## 2019-05-01 LAB — PRO B NATRIURETIC PEPTIDE: NT-Pro BNP: 86 pg/mL (ref 0–121)

## 2019-06-22 ENCOUNTER — Other Ambulatory Visit: Payer: Self-pay | Admitting: Cardiology

## 2019-06-22 MED ORDER — CARVEDILOL 25 MG PO TABS: 25.0000 mg | ORAL_TABLET | Freq: Two times a day (BID) | ORAL | 1 refills | Status: DC

## 2019-06-22 NOTE — Telephone Encounter (Signed)
Medication refilled

## 2019-06-22 NOTE — Telephone Encounter (Signed)
*  STAT* If patient is at the pharmacy, call can be transferred to refill team.   1. Which medications need to be refilled? (please list name of each medication and dose if known)  carvedilol (COREG) 25 MG tablet  Pt takes 25 mg 2x daily   2. Which pharmacy/location (including street and city if local pharmacy) is medication to be sent to?  [id number] - [place], [place] - [address]  3. Do they need a 30 day or 90 day supply? 90  Patient will [facility] dosage and instructions. He was instructed at his last office visit to double the dosage of his medication , but the rx stayed the same, so he has run out faster than anticipated.

## 2019-06-26 ENCOUNTER — Other Ambulatory Visit: Payer: Self-pay

## 2019-06-26 ENCOUNTER — Ambulatory Visit (INDEPENDENT_AMBULATORY_CARE_PROVIDER_SITE_OTHER): Payer: 59

## 2019-06-26 DIAGNOSIS — M7989 Other specified soft tissue disorders: Secondary | ICD-10-CM

## 2019-06-26 DIAGNOSIS — R011 Cardiac murmur, unspecified: Secondary | ICD-10-CM

## 2019-06-26 DIAGNOSIS — I10 Essential (primary) hypertension: Secondary | ICD-10-CM

## 2019-06-26 DIAGNOSIS — I350 Nonrheumatic aortic (valve) stenosis: Secondary | ICD-10-CM

## 2019-06-26 NOTE — Progress Notes (Signed)
Complete echocardiogram has been performed.  Avelardo Aldrich Lloyd RDCS, RVT 

## 2019-06-28 ENCOUNTER — Telehealth: Payer: Self-pay | Admitting: Cardiology

## 2019-06-28 NOTE — Telephone Encounter (Signed)
Pt verbalized understanding of his Echo results... will froward to a [facility] to schedule hm with Dr. Dulce Sellar in [place] in the next week or so.

## 2019-06-28 NOTE — Telephone Encounter (Signed)
New message   Patient is returning call for Echo results. Please call. 

## 2019-07-10 NOTE — Progress Notes (Signed)
Cardiology Office Note:    Date:  07/11/2019   ID:  Antonio Riley, DOB 04-25-1966, MRN [id number]  PCP:  Baldo Ash, FNP  Cardiologist:  Norman Herrlich, MD    Referring MD: Baldo Ash, FNP    ASSESSMENT:    No diagnosis found. PLAN:    In order of problems listed above:  1. Aortic stenosis severe clinically bicuspid valve I cannot identify an echocardiogram he is symptomatic stage D and with his young age I think he is best served with surgical AVR he is interested in exploring whether TAVR is an option and I think it is best for him to have an office appointment to meet Dr. Cornelius Moras and discuss the disease natural course and treatment choices.  The patient is agreeable and will schedule our office today.  Also check a LP(a) level with its association with aortic stenosis although his lipid profile is favorable 2. Resistant hypertension much improved at target and continue current treatment with addition of MRA and ACE inhibitor check renal function potassium today   Next appointment:  6 weeks   Medication Adjustments/Labs and Tests Ordered: Current medicines are reviewed at length with the patient today.  Concerns regarding medicines are outlined above.  No orders of the defined types were placed in this encounter.  No orders of the defined types were placed in this encounter.   Chief Complaint  Patient presents with  . Follow-up    History of Present Illness:    Antonio Riley is a 54 y.o. male with a hx of  resistant hypertension and murmur of aortic stenosis last seen 04/30/2019.  Had a [facility] ED visit 07/17/2018 with rectal bleeding discharged to outpatient care  Compliance with diet, lifestyle and medications: Yes  After his last visit he had an echocardiogram performed 06/26/2019-moderate concentric LVH normal ejection fraction 65 to 70% a thickened calcified and restricted aortic valve with mild to moderate regurgitation and severe  stenosis peak and mean gradients 80 and 43 mm mercury VTI ratio 0.27 and valve area 0.85 cm.  The visualized thoracic aorta was normal in size.  I reviewed the echocardiogram independently,  the valve morphology cannot be defined, but clinically he has bicuspid aortic stenosis with early in life heart murmur.  This was a good visit we have a chance to sit down and go over his echocardiogram ordered.  He understands he has severe aortic stenosis and we discussed the natural history and symptoms he has had some exertional chest tightness and what he is really notices a decrease in his functional ability he fatigues easily.  He has no family history of a bicuspid aortic valve and has a lifelong history of heart murmur.  He asked me about options for correction and with his age I think he is likely served best with surgical AVR but I told him the best thing for him to do is to make an appointment with cardiothoracic surgery and review his choices.  We will do lab work today for renal function also check a LP(a) level his lipid profile is favorable.  His hypertension is now well controlled. Past Medical History:  Diagnosis Date  . Hypertension 03/15/2018  . Knee pain 03/15/2018  . Swelling of lower leg 03/15/2018    Past Surgical History:  Procedure Laterality Date  . FEMUR SURGERY    . WRIST SURGERY      Current Medications: Current Meds  Medication Sig  . amoxicillin (AMOXIL) 500 MG capsule Take  4 capsules (2,000 mg) 45 minutes before dental procedures.  . [place]-Glucosamine-Vit D (OSTEO BI-FLEX ONE PER DAY PO) Take by mouth.  . Calcium Carbonate-Vitamin D (CALCIUM 600/VITAMIN D PO) Take 1 tablet by mouth daily.  . carvedilol (COREG) 25 MG tablet Take 1 tablet (25 mg total) by mouth 2 (two) times daily.  . Cholecalciferol 25 MCG (1000 UT) tablet Take by mouth.  . hydrochlorothiazide (HYDRODIURIL) 25 MG tablet Take 1 tablet (25 mg total) by mouth daily.  Marland Kitchen lisinopril (ZESTRIL) 20 MG tablet  Take 1 tablet (20 mg total) by mouth daily.  Marland Kitchen omeprazole (PRILOSEC OTC) 20 MG tablet Take 20 mg by mouth daily.  Marland Kitchen OVER THE COUNTER MEDICATION Take 1 tablet by mouth daily. Allergy Medicine  . spironolactone (ALDACTONE) 25 MG tablet Take 1 tablet (25 mg total) by mouth daily.     Allergies:   Coricidin hbp cough-cold [chlorpheniramine-dm]   Social History   Socioeconomic History  . Marital status: Married    Spouse name: Not on file  . Number of children: Not on file  . Years of education: Not on file  . Highest education level: Not on file  Occupational History  . Not on file  Tobacco Use  . Smoking status: Current Every Day Smoker    Packs/day: 1.00    Years: 28.00    Pack years: 28.00    Types: Cigarettes  . Smokeless tobacco: Never Used  Substance and Sexual Activity  . Alcohol use: Yes    Comment: occ beer   . Drug use: Not Currently  . Sexual activity: Not on file  Other Topics Concern  . Not on file  Social History Narrative  . Not on file   Social Determinants of Health   Financial Resource Strain:   . Difficulty of Paying Living Expenses: Not on file  Food Insecurity:   . Worried About Programme researcher, broadcasting/film/video in the Last Year: Not on file  . Ran Out of Food in the Last Year: Not on file  Transportation Needs:   . Lack of Transportation (Medical): Not on file  . Lack of Transportation (Non-Medical): Not on file  Physical Activity:   . Days of Exercise per Week: Not on file  . Minutes of Exercise per Session: Not on file  Stress:   . Feeling of Stress : Not on file  Social Connections:   . Frequency of Communication with Friends and Family: Not on file  . Frequency of Social Gatherings with Friends and Family: Not on file  . Attends Religious Services: Not on file  . Active Member of Clubs or Organizations: Not on file  . Attends Banker Meetings: Not on file  . Marital Status: Not on file     Family History: The patient's family history  includes CAD in his mother; Deep vein thrombosis in his mother; Diabetes in his cousin and mother; Heart murmur in his mother. ROS:   Please see the history of present illness.    All other systems reviewed and are negative.  EKGs/Labs/Other Studies Reviewed:    The following studies were reviewed today:  Recent Labs: 04/30/2019: ALT 20; BUN 12; Creatinine, Ser 0.96; NT-Pro BNP 86; Potassium 4.2; Sodium 131  Recent Lipid Panel    Component Value Date/Time   CHOL 173 04/30/2019 1215   TRIG 117 04/30/2019 1215   HDL 45 04/30/2019 1215   CHOLHDL 3.8 04/30/2019 1215   LDLCALC 107 (H) 04/30/2019 1215    Physical  Exam:    VS:  BP 128/88   Pulse 71   Ht 6\' 1"  (1.854 m)   Wt (!) 329 lb (149.2 kg)   SpO2 97%   BMI 43.41 kg/m     Wt Readings from Last 3 Encounters:  07/11/19 (!) 329 lb (149.2 kg)  04/30/19 (!) 319 lb (144.7 kg)  03/23/18 (!) 337 lb 6.4 oz (153 kg)     GEN:  Well nourished, well developed in no acute distress HEENT: Normal NECK: No JVD; No carotid bruits LYMPHATICS: No lymphadenopathy CARDIAC: He has a quite harsh holosystolic murmur encompasses S2 radiates to the carotids I cannot auscultate aortic regurgitation RRR, no murmurs, rubs, gallops RESPIRATORY:  Clear to auscultation without rales, wheezing or rhonchi  ABDOMEN: Soft, non-tender, non-distended MUSCULOSKELETAL:  No edema; No deformity  SKIN: Warm and dry NEUROLOGIC:  Alert and oriented x 3 PSYCHIATRIC:  Normal affect    Signed, Shirlee More, MD  07/11/2019 10:08 AM    [place]

## 2019-07-11 ENCOUNTER — Encounter: Payer: Self-pay | Admitting: *Deleted

## 2019-07-11 ENCOUNTER — Ambulatory Visit (INDEPENDENT_AMBULATORY_CARE_PROVIDER_SITE_OTHER): Payer: 59 | Admitting: Cardiology

## 2019-07-11 ENCOUNTER — Other Ambulatory Visit: Payer: Self-pay

## 2019-07-11 ENCOUNTER — Encounter: Payer: Self-pay | Admitting: Cardiology

## 2019-07-11 VITALS — BP 128/88 | HR 71 | Ht 73.0 in | Wt 329.0 lb

## 2019-07-11 DIAGNOSIS — I350 Nonrheumatic aortic (valve) stenosis: Secondary | ICD-10-CM | POA: Diagnosis not present

## 2019-07-11 NOTE — Patient Instructions (Addendum)
Medication Instructions:  Your physician recommends that you continue on your current medications as directed. Please refer to the Current Medication list given to you today.  *If you need a refill on your cardiac medications before your next appointment, please call your pharmacy*  Lab Work: Your physician recommends that you return for lab work in: TODAY BMP,Lipoprotein A  If you have labs (blood work) drawn today and your tests are completely normal, you will receive your results only by: Marland Kitchen MyChart Message (if you have MyChart) OR . A paper copy in the mail If you have any lab test that is abnormal or we need to change your treatment, we will call you to review the results.  Testing/Procedures: None  Follow-Up: At [facility], you and your health needs are our priority.  As part of our continuing mission to provide you with exceptional heart care, we have created designated Provider Care Teams.  These Care Teams include your primary Cardiologist (physician) and Advanced Practice Providers (APPs -  Physician Assistants and Nurse Practitioners) who all work together to provide you with the care you need, when you need it.  Your next appointment:   6 week(s)  The format for your next appointment:   In Person  Provider:   Norman Herrlich, MD  Other Instructions You are being referred to Dr Cornelius Moras for your aortic stenosis. They will contact you with an appointment date and time.

## 2019-07-12 ENCOUNTER — Telehealth: Payer: Self-pay | Admitting: *Deleted

## 2019-07-12 DIAGNOSIS — I10 Essential (primary) hypertension: Secondary | ICD-10-CM

## 2019-07-12 DIAGNOSIS — E7841 Elevated Lipoprotein(a): Secondary | ICD-10-CM

## 2019-07-12 LAB — BASIC METABOLIC PANEL
BUN/Creatinine Ratio: 12 (ref 9–20)
BUN: 13 mg/dL (ref 6–24)
CO2: 26 mmol/L (ref 20–29)
Calcium: 10 mg/dL (ref 8.7–10.2)
Chloride: 97 mmol/L (ref 96–106)
Creatinine, Ser: 1.1 mg/dL (ref 0.76–1.27)
GFR calc Af Amer: 89 mL/min/{1.73_m2} (ref >59)
GFR calc non Af Amer: 77 mL/min/{1.73_m2} (ref >59)
Glucose: 131 mg/dL — ABNORMAL HIGH (ref 65–99)
Potassium: 5.9 mmol/L — ABNORMAL HIGH (ref 3.5–5.2)
Sodium: 135 mmol/L (ref 134–144)

## 2019-07-12 LAB — LIPOPROTEIN A (LPA): Lipoprotein (a): 103.6 nmol/L — ABNORMAL HIGH (ref <75.0)

## 2019-07-12 MED ORDER — ROSUVASTATIN CALCIUM 5 MG PO TABS: 5.0000 mg | ORAL_TABLET | Freq: Every day | ORAL | 1 refills | Status: DC

## 2019-07-12 NOTE — Telephone Encounter (Signed)
Patient informed and verbalized understanding of plan. Copy sent to PCP 

## 2019-07-12 NOTE — Telephone Encounter (Signed)
-----   Message from Baldo Daub, MD sent at 07/12/2019  8:01 AM EST ----- Normal or stable result  His potassium is mildly elevated, unfortunately he needs to have a repeat BMP done and sometimes this can be seen from handling the specimen disruption of breath sounds but also can be seen at times with the medications he is taking for his blood pressure.  The lipoprotein a level is elevated this is associated with more rapid progression of a bicuspid aortic valve and despite his lipid profile being good he should start on a low-dose of statin rosuvastatin 5 mg daily dispense 3 refills and have a follow-up lipid profile in about 6 to 8 weeks

## 2019-07-19 ENCOUNTER — Encounter: Payer: 59 | Admitting: Thoracic Surgery (Cardiothoracic Vascular Surgery)

## 2019-07-20 NOTE — Progress Notes (Signed)
This encounter was created in error - please disregard.

## 2019-07-23 ENCOUNTER — Other Ambulatory Visit: Payer: Self-pay | Admitting: Thoracic Surgery (Cardiothoracic Vascular Surgery)

## 2019-07-23 ENCOUNTER — Other Ambulatory Visit: Payer: Self-pay

## 2019-07-23 ENCOUNTER — Encounter: Payer: Self-pay | Admitting: Thoracic Surgery (Cardiothoracic Vascular Surgery)

## 2019-07-23 ENCOUNTER — Institutional Professional Consult (permissible substitution): Payer: 59 | Admitting: Thoracic Surgery (Cardiothoracic Vascular Surgery)

## 2019-07-23 ENCOUNTER — Encounter: Payer: 59 | Admitting: Thoracic Surgery (Cardiothoracic Vascular Surgery)

## 2019-07-23 VITALS — BP 128/82 | HR 73 | Temp 98.1°F | Resp 20 | Ht 73.0 in | Wt 325.0 lb

## 2019-07-23 DIAGNOSIS — I350 Nonrheumatic aortic (valve) stenosis: Secondary | ICD-10-CM

## 2019-07-23 NOTE — Progress Notes (Signed)
[facility]  MULTIDISCIPLINARY HEART VALVE CLINIC  CARDIOTHORACIC SURGERY CONSULTATION REPORT  Referring Provider is Munley, Brian J, MD Primary Cardiologist is Brian Munley, MD PCP is Martin, Michael, FNP  Chief Complaint  Patient presents with  . Aortic Stenosis    Surgical eval, ECHO 07/06/19    HPI:  Patient is 54-year-old morbidly obese white male with history of hypertension who has been referred for surgical consultation to discuss treatment options for management of severe symptomatic aortic stenosis.  Patient states that he was first noted to have a heart murmur several years ago by his primary care physician.  He was referred for cardiology consultation and evaluated by Dr. Munley November 2019.  Transthoracic echocardiogram was ordered but never performed.  Patient states that he did not go through with echocardiogram because of the high associated out-of-pocket expense.  The patient was recently seen in follow-up by Dr. Munley and transthoracic echocardiogram eventually performed June 26, 2019.  Echocardiogram revealed normal left ventricular systolic function with likely bicuspid aortic valve and severe aortic stenosis.  Cardiothoracic surgical consultation was requested.  Patient is married and lives locally in [place] with his wife and one of his adult children.  He works full-time as a machinist.  He lives a somewhat sedentary lifestyle but reports no significant physical limitation other than that of exertional shortness of breath.  He states that he has had symptoms of exertional shortness of breath and chest discomfort for at least 3 or 4 years.  Symptoms have continued to gradually progress.  He now gets short of breath with moderate level activity.  Symptoms are sometimes associated with tightness across the chest.  Symptoms are always relieved by rest.  He denies nocturnal chest pain or shortness of breath.  He reports occasional mild dizzy spells with no  history of syncope.  He has had some mild lower extremity edema.  He denies history of PND, orthopnea, or lower extremity edema.  Past Medical History:  Diagnosis Date  . Hypertension 03/15/2018  . Knee pain 03/15/2018  . Swelling of lower leg 03/15/2018    Past Surgical History:  Procedure Laterality Date  . FEMUR SURGERY    . WRIST SURGERY      Family History  Problem Relation Age of Onset  . CAD Mother   . Deep vein thrombosis Mother   . Heart murmur Mother   . Diabetes Mother   . Diabetes Cousin     Social History   Socioeconomic History  . Marital status: Married    Spouse name: Not on file  . Number of children: Not on file  . Years of education: Not on file  . Highest education level: Not on file  Occupational History  . Not on file  Tobacco Use  . Smoking status: Current Every Day Smoker    Packs/day: 1.00    Years: 28.00    Pack years: 28.00    Types: Cigarettes  . Smokeless tobacco: Never Used  Substance and Sexual Activity  . Alcohol use: Yes    Comment: occ beer   . Drug use: Not Currently  . Sexual activity: Not on file  Other Topics Concern  . Not on file  Social History Narrative  . Not on file   Social Determinants of Health   Financial Resource Strain:   . Difficulty of Paying Living Expenses: Not on file  Food Insecurity:   . Worried About Running Out of Food in the Last Year: Not on file  .   Ran Out of Food in the Last Year: Not on file  Transportation Needs:   . Lack of Transportation (Medical): Not on file  . Lack of Transportation (Non-Medical): Not on file  Physical Activity:   . Days of Exercise per Week: Not on file  . Minutes of Exercise per Session: Not on file  Stress:   . Feeling of Stress : Not on file  Social Connections:   . Frequency of Communication with Friends and Family: Not on file  . Frequency of Social Gatherings with Friends and Family: Not on file  . Attends Religious Services: Not on file  . Active Member  of Clubs or Organizations: Not on file  . Attends Banker Meetings: Not on file  . Marital Status: Not on file  Intimate Partner Violence:   . Fear of Current or Ex-Partner: Not on file  . Emotionally Abused: Not on file  . Physically Abused: Not on file  . Sexually Abused: Not on file    Current Outpatient Medications  Medication Sig Dispense Refill  . amoxicillin (AMOXIL) 500 MG capsule Take 4 capsules (2,000 mg) 45 minutes before dental procedures. 20 capsule 0  . [place]wellia-Glucosamine-Vit D (OSTEO BI-FLEX ONE PER DAY PO) Take by mouth.    . Calcium Carbonate-Vitamin D (CALCIUM 600/VITAMIN D PO) Take 1 tablet by mouth daily.    . carvedilol (COREG) 25 MG tablet Take 1 tablet (25 mg total) by mouth 2 (two) times daily. 180 tablet 1  . Cholecalciferol 25 MCG (1000 UT) tablet Take by mouth.    . hydrochlorothiazide (HYDRODIURIL) 25 MG tablet Take 1 tablet (25 mg total) by mouth daily. 90 tablet 1  . lisinopril (ZESTRIL) 20 MG tablet Take 1 tablet (20 mg total) by mouth daily. 90 tablet 1  . omeprazole (PRILOSEC OTC) 20 MG tablet Take 20 mg by mouth daily.    Marland Kitchen OVER THE COUNTER MEDICATION Take 1 tablet by mouth daily. Allergy Medicine    . spironolactone (ALDACTONE) 25 MG tablet Take 1 tablet (25 mg total) by mouth daily. 90 tablet 1  . rosuvastatin (CRESTOR) 5 MG tablet Take 1 tablet (5 mg total) by mouth daily. (Patient not taking: Reported on 07/23/2019) 90 tablet 1   No current facility-administered medications for this visit.    Allergies  Allergen Reactions  . Coricidin Hbp Cough-Cold [Chlorpheniramine-Dm] Hives      Review of Systems:   General:  normal appetite, decreased energy, no weight gain, no weight loss, no fever  Cardiac:  + chest pain with exertion, no chest pain at rest, + SOB with exertion, no resting SOB, no PND, no orthopnea, no palpitations, no arrhythmia, no atrial fibrillation, + LE edema, mild dizzy spells, no syncope  Respiratory:  + shortness  of breath, no home oxygen, no productive cough, + dry cough, no bronchitis, no wheezing, no hemoptysis, no asthma, no pain with inspiration or cough, no sleep apnea, no CPAP at night  GI:   no difficulty swallowing, + reflux, no frequent heartburn, no hiatal hernia, no abdominal pain, occasional constipation, no diarrhea, no hematochezia, no hematemesis, no melena  GU:   no dysuria,  no frequency, no urinary tract infection, no hematuria, no enlarged prostate, no kidney stones, no kidney disease  Vascular:  no pain suggestive of claudication, no pain in feet, + leg cramps, no varicose veins, no DVT, no non-healing foot ulcer  Neuro:   no stroke, no TIA's, no seizures, + headaches, no temporary blindness one eye,  no slurred speech, no peripheral neuropathy, no chronic pain, no instability of gait, no memory/cognitive dysfunction  Musculoskeletal: no arthritis, no joint swelling, no myalgias, no difficulty walking, normal mobility   Skin:   no rash, no itching, no skin infections, no pressure sores or ulcerations  Psych:   no anxiety, no depression, no nervousness, no unusual recent stress  Eyes:   no blurry vision, + floaters, no recent vision changes, + wears glasses for reading only  ENT:   no hearing loss, no loose or painful teeth, no dentures, last saw dentist within 6 months  Hematologic:  no easy bruising, no abnormal bleeding, no clotting disorder, no frequent epistaxis  Endocrine:  no diabetes, does not check CBG's at home           Physical Exam:   BP 128/82   Pulse 73   Temp 98.1 F (36.7 C) (Skin)   Resp 20   Ht 6\' 1"  (1.854 m)   Wt (!) 325 lb (147.4 kg)   SpO2 96% Comment: RA  BMI 42.88 kg/m   General:  Obese,  well-appearing  HEENT:  Unremarkable   Neck:   no JVD, no bruits, no adenopathy   Chest:   clear to auscultation, symmetrical breath sounds, no wheezes, no rhonchi   CV:   RRR, grade III/VI crescendo/decrescendo murmur heard best at RSB,  no diastolic  murmur  Abdomen:  soft, non-tender, no masses   Extremities:  warm, well-perfused, pulses diminished but, trace LE edema  Rectal/GU  Deferred  Neuro:   Grossly non-focal and symmetrical throughout  Skin:   Clean and dry, no rashes, no breakdown   Diagnostic Tests:   ECHOCARDIOGRAM REPORT       Patient Name:  Antonio Riley Full Date of Exam: 06/26/2019  Medical Rec #: 08/24/2019      Height:    73.0 in  Accession #:  [id number]     Weight:    319.0 lb  Date of Birth: [birth year]      BSA:     2.62 m  Patient Age:  53 years      BP:      162/86 mmHg  Patient Gender: M          HR:      65 bpm.  Exam Location:    Procedure: 2D Echo   Indications:  Nonrheumatic aortic valve stenosis [I35.0 (ICD-10-CM)];  Swelling         of lower leg [M79.89 (ICD-10-CM)]    History:    Patient has no prior history of Echocardiogram  examinations.         Signs/Symptoms:Murmur; Risk Factors:Hypertension.    Sonographer:  08-01-1986  Referring Phys: [phone] BRIAN J MUNLEY   IMPRESSIONS    1. Left ventricular ejection fraction, by estimation, is 65 to 70%. The  left ventricle has normal function. The left ventrical has no regional  wall motion abnormalities. There is moderately increased left ventricular  hypertrophy. Left ventricular  diastolic parameters are consistent with Grade II diastolic dysfunction  (pseudonormalization).  2. Left atrial size was mildly dilated.  3. Trivial mitral valve regurgitation.  4. The aortic valve is hickened, moderate capcification noted, restricted  mobility. Can not r/o bicuspid valve. Aortic valve regurgitation is mild  to moderate. Severe aortic valve stenosis. Peak/mean gradient : 80/43  mmHg, AVA 0.85 cm2, DI 0.27.  5. The inferior vena cava is normal in size with greater than 50%  respiratory variability, suggesting right atrial  pressure of 3 mmHg.    FINDINGS  Left Ventricle: Left ventricular ejection fraction, by estimation, is 65  to 70%. The left ventricle has normal function. The left ventricle has no  regional wall motion abnormalities. The left ventricular internal cavity  size was normal in size. There is  moderately increased left ventricular hypertrophy. Left ventricular  diastolic parameters are consistent with Grade II diastolic dysfunction  (pseudonormalization).   Right Ventricle: The right ventricular size is normal. No increase in  right ventricular wall thickness. Right ventricular systolic function is  normal. There is normal pulmonary artery systolic pressure. The tricuspid  regurgitant velocity is 2.42 m/s, and  with an assumed right atrial pressure of 3 mmHg, the estimated right  ventricular systolic pressure is 94.7 mmHg.   Left Atrium: Left atrial size was mildly dilated.   Right Atrium: Right atrial size was normal in size.   Pericardium: There is no evidence of pericardial effusion.   Mitral Valve: The mitral valve is normal in structure and function. Normal  mobility of the mitral valve leaflets. Trivial mitral valve regurgitation.  No evidence of mitral valve stenosis. MV peak gradient, 5.3 mmHg. The mean  mitral valve gradient is 3.0  mmHg.   Tricuspid Valve: The tricuspid valve is normal in structure. Tricuspid  valve regurgitation is not demonstrated. No evidence of tricuspid  stenosis.   Aortic Valve: The aortic valve is normal in structure and function.. There  is moderate thickening and moderate calcification of the aortic valve.  Aortic valve regurgitation is mild to moderate. Aortic regurgitation PHT  measures 671 msec. Severe aortic  stenosis is present. There is moderate thickening of the aortic valve.  There is moderate calcification of the aortic valve. Aortic valve mean  gradient measures 43.0 mmHg. Aortic valve peak gradient measures 80.3  mmHg. Aortic valve area, by VTI  measures  0.85 cm.   Pulmonic Valve: The pulmonic valve was normal in structure. Pulmonic valve  regurgitation is not visualized. No evidence of pulmonic stenosis.   Aorta: The aortic root is normal in size and structure.   Venous: The inferior vena cava is normal in size with greater than 50%  respiratory variability, suggesting right atrial pressure of 3 mmHg. The  inferior vena cava and the hepatic vein show a normal flow pattern.   IAS/Shunts: No atrial level shunt detected by color flow Doppler.     LEFT VENTRICLE  PLAX 2D  LVIDd:     5.10 cm Diastology  LVIDs:     3.30 cm LV e' lateral:  6.42 cm/s  LV PW:     1.80 cm LV E/e' lateral: 20.1  LV IVS:    1.70 cm LV e' medial:  5.22 cm/s  LVOT diam:   2.00 cm LV E/e' medial: 24.7  LV SV:     98.02 ml  LV SV Index:  28.53  LVOT Area:   3.14 cm     RIGHT VENTRICLE     IVC  TAPSE (M-mode): 3.1 cm IVC diam: 1.70 cm   LEFT ATRIUM       Index    RIGHT ATRIUM      Index  LA diam:    4.90 cm 1.87 cm/m RA Area:   15.30 cm  LA Vol (A2C):  107.0 ml 40.78 ml/m RA Volume:  38.80 ml 14.79 ml/m  LA Vol (A4C):  90.8 ml 34.61 ml/m  LA Biplane Vol: 100.0 ml 38.11 ml/m  AORTIC VALVE  AV Area (Vmax):  0.83 cm  AV Area (Vmean):  0.80 cm  AV Area (VTI):   0.85 cm  AV Vmax:      448.00 cm/s  AV Vmean:     308.000 cm/s  AV VTI:      1.150 m  AV Peak Grad:   80.3 mmHg  AV Mean Grad:   43.0 mmHg  LVOT Vmax:     119.00 cm/s  LVOT Vmean:    78.100 cm/s  LVOT VTI:     0.312 m  LVOT/AV VTI ratio: 0.27  AI PHT:      671 msec    AORTA  Ao Root diam: 3.50 cm   MITRAL VALVE             TRICUSPID VALVE  MV Area (PHT): 2.51 cm       TR Peak grad:  23.4 mmHg  MV Peak grad: 5.3 mmHg       TR Vmax:    242.00 cm/s  MV Mean grad: 3.0 mmHg  MV Vmax:    1.15 m/s       SHUNTS  MV  Vmean:   75.3 cm/s       Systemic VTI: 0.31 m  MV Decel Time: 303 msec       Systemic Diam: 2.00 cm  MV E velocity: 129.00 cm/s 103 cm/s  MV A velocity: 80.80 cm/s 70.3 cm/s  MV E/A ratio: 1.60    1.5   Gypsy Balsamobert Krasowski MD  Electronically signed by Gypsy Balsamobert Krasowski MD  Signature Date/Time: 06/26/2019/12:26:31 PM         Impression:  Patient has likely bicuspid aortic valve disease with stage D severe symptomatic aortic stenosis.  He describes a several year history of progressive symptoms of exertional shortness of breath and chest tightness consistent with chronic diastolic congestive heart failure, [place] Heart Association functional class II.  He may also be having stable angina pectoris.  I have personally reviewed the patient's recent transthoracic echocardiogram which demonstrates the presence of severe aortic stenosis and normal left ventricular systolic function.  Images are suboptimal but the patient appears to have a type I bicuspid aortic valve.  There is severe calcification with restricted leaflet mobility and severe aortic stenosis.  Peak velocity across aortic valve measured close to 4.5 m/s corresponding to mean transvalvular gradient well above 40 mmHg.  The patient also has mild to moderate aortic insufficiency.  Left ventricular systolic function appears normal.  There is left ventricular hypertrophy with some degree of diastolic dysfunction.  I agree the patient needs aortic valve replacement.   Plan:  The patient was counseled at length regarding treatment alternatives for management of severe aortic stenosis including continued medical therapy versus proceeding with aortic valve replacement in the near future.  The natural history of aortic stenosis was reviewed, as was long term prognosis with medical therapy alone.  Surgical options were discussed at length including conventional surgical aortic valve replacement through either a full median  sternotomy or using minimally invasive techniques.  Other alternatives including rapid-deployment bioprosthetic tissue valve replacement, transcatheter aortic valve replacement, patch enlargement of the aortic root, stentless porcine aortic root replacement, valve repair, the Ross autograft procedure, and homograft aortic root replacement were discussed.  Discussion was held comparing the relative risks of mechanical valve replacement with need for lifelong anticoagulation versus use of a bioprosthetic tissue valve and the associated potential for late structural valve deterioration and failure.    Patient is interested in proceeding with further diagnostic  testing and surgical intervention.  As a neck step the patient will undergo diagnostic cardiac catheterization to rule out the presence of significant coronary artery disease and evaluate right heart pressures.  Patient will also undergo cardiac gated CT angiogram of the heart and CT angiography of the aorta and iliac vessels to further evaluate treatment options including transcatheter aortic valve replacement and/or conventional surgery via minimally invasive techniques.  Finally, the patient will undergo pulmonary function testing.  The patient has been strongly advised to quit smoking.    I spent in excess of 90 minutes during the conduct of this office consultation and >50% of this time involved direct face-to-face encounter with the patient for counseling and/or coordination of their care.    Salvatore Decent. Cornelius Moras, MD 07/23/2019 2:53 PM

## 2019-07-23 NOTE — H&P (View-Only) (Signed)
[facility]  MULTIDISCIPLINARY HEART VALVE CLINIC  CARDIOTHORACIC SURGERY CONSULTATION REPORT  Referring Provider is Baldo Daub, MD Primary Cardiologist is Norman Herrlich, MD PCP is Baldo Ash, FNP  Chief Complaint  Patient presents with  . Aortic Stenosis    Surgical eval, ECHO 07/06/19    HPI:  Patient is 54 year old morbidly obese white male with history of hypertension who has been referred for surgical consultation to discuss treatment options for management of severe symptomatic aortic stenosis.  Patient states that he was first noted to have a heart murmur several years ago by his primary care physician.  He was referred for cardiology consultation and evaluated by Dr. Dulce Sellar November 2019.  Transthoracic echocardiogram was ordered but never performed.  Patient states that he did not go through with echocardiogram because of the high associated out-of-pocket expense.  The patient was recently seen in follow-up by Dr. Dulce Sellar and transthoracic echocardiogram eventually performed June 26, 2019.  Echocardiogram revealed normal left ventricular systolic function with likely bicuspid aortic valve and severe aortic stenosis.  Cardiothoracic surgical consultation was requested.  Patient is married and lives locally in [place] with his wife and one of his adult children.  He works full-time as a Chartered certified accountant.  He lives a somewhat sedentary lifestyle but reports no significant physical limitation other than that of exertional shortness of breath.  He states that he has had symptoms of exertional shortness of breath and chest discomfort for at least 3 or 4 years.  Symptoms have continued to gradually progress.  He now gets short of breath with moderate level activity.  Symptoms are sometimes associated with tightness across the chest.  Symptoms are always relieved by rest.  He denies nocturnal chest pain or shortness of breath.  He reports occasional mild dizzy spells with no  history of syncope.  He has had some mild lower extremity edema.  He denies history of PND, orthopnea, or lower extremity edema.  Past Medical History:  Diagnosis Date  . Hypertension 03/15/2018  . Knee pain 03/15/2018  . Swelling of lower leg 03/15/2018    Past Surgical History:  Procedure Laterality Date  . FEMUR SURGERY    . WRIST SURGERY      Family History  Problem Relation Age of Onset  . CAD Mother   . Deep vein thrombosis Mother   . Heart murmur Mother   . Diabetes Mother   . Diabetes Cousin     Social History   Socioeconomic History  . Marital status: Married    Spouse name: Not on file  . Number of children: Not on file  . Years of education: Not on file  . Highest education level: Not on file  Occupational History  . Not on file  Tobacco Use  . Smoking status: Current Every Day Smoker    Packs/day: 1.00    Years: 28.00    Pack years: 28.00    Types: Cigarettes  . Smokeless tobacco: Never Used  Substance and Sexual Activity  . Alcohol use: Yes    Comment: occ beer   . Drug use: Not Currently  . Sexual activity: Not on file  Other Topics Concern  . Not on file  Social History Narrative  . Not on file   Social Determinants of Health   Financial Resource Strain:   . Difficulty of Paying Living Expenses: Not on file  Food Insecurity:   . Worried About Programme researcher, broadcasting/film/video in the Last Year: Not on file  .  Ran Out of Food in the Last Year: Not on file  Transportation Needs:   . Lack of Transportation (Medical): Not on file  . Lack of Transportation (Non-Medical): Not on file  Physical Activity:   . Days of Exercise per Week: Not on file  . Minutes of Exercise per Session: Not on file  Stress:   . Feeling of Stress : Not on file  Social Connections:   . Frequency of Communication with Friends and Family: Not on file  . Frequency of Social Gatherings with Friends and Family: Not on file  . Attends Religious Services: Not on file  . Active Member  of Clubs or Organizations: Not on file  . Attends Banker Meetings: Not on file  . Marital Status: Not on file  Intimate Partner Violence:   . Fear of Current or Ex-Partner: Not on file  . Emotionally Abused: Not on file  . Physically Abused: Not on file  . Sexually Abused: Not on file    Current Outpatient Medications  Medication Sig Dispense Refill  . amoxicillin (AMOXIL) 500 MG capsule Take 4 capsules (2,000 mg) 45 minutes before dental procedures. 20 capsule 0  . [place]well[place]-Glucosamine-Vit D (OSTEO BI-FLEX ONE PER DAY PO) Take by mouth.    . Calcium Carbonate-Vitamin D (CALCIUM 600/VITAMIN D PO) Take 1 tablet by mouth daily.    . carvedilol (COREG) 25 MG tablet Take 1 tablet (25 mg total) by mouth 2 (two) times daily. 180 tablet 1  . Cholecalciferol 25 MCG (1000 UT) tablet Take by mouth.    . hydrochlorothiazide (HYDRODIURIL) 25 MG tablet Take 1 tablet (25 mg total) by mouth daily. 90 tablet 1  . lisinopril (ZESTRIL) 20 MG tablet Take 1 tablet (20 mg total) by mouth daily. 90 tablet 1  . omeprazole (PRILOSEC OTC) 20 MG tablet Take 20 mg by mouth daily.    Marland Kitchen OVER THE COUNTER MEDICATION Take 1 tablet by mouth daily. Allergy Medicine    . spironolactone (ALDACTONE) 25 MG tablet Take 1 tablet (25 mg total) by mouth daily. 90 tablet 1  . rosuvastatin (CRESTOR) 5 MG tablet Take 1 tablet (5 mg total) by mouth daily. (Patient not taking: Reported on 07/23/2019) 90 tablet 1   No current facility-administered medications for this visit.    Allergies  Allergen Reactions  . Coricidin Hbp Cough-Cold [Chlorpheniramine-Dm] Hives      Review of Systems:   General:  normal appetite, decreased energy, no weight gain, no weight loss, no fever  Cardiac:  + chest pain with exertion, no chest pain at rest, + SOB with exertion, no resting SOB, no PND, no orthopnea, no palpitations, no arrhythmia, no atrial fibrillation, + LE edema, mild dizzy spells, no syncope  Respiratory:  + shortness  of breath, no home oxygen, no productive cough, + dry cough, no bronchitis, no wheezing, no hemoptysis, no asthma, no pain with inspiration or cough, no sleep apnea, no CPAP at night  GI:   no difficulty swallowing, + reflux, no frequent heartburn, no hiatal hernia, no abdominal pain, occasional constipation, no diarrhea, no hematochezia, no hematemesis, no melena  GU:   no dysuria,  no frequency, no urinary tract infection, no hematuria, no enlarged prostate, no kidney stones, no kidney disease  Vascular:  no pain suggestive of claudication, no pain in feet, + leg cramps, no varicose veins, no DVT, no non-healing foot ulcer  Neuro:   no stroke, no TIA's, no seizures, + headaches, no temporary blindness one eye,  no slurred speech, no peripheral neuropathy, no chronic pain, no instability of gait, no memory/cognitive dysfunction  Musculoskeletal: no arthritis, no joint swelling, no myalgias, no difficulty walking, normal mobility   Skin:   no rash, no itching, no skin infections, no pressure sores or ulcerations  Psych:   no anxiety, no depression, no nervousness, no unusual recent stress  Eyes:   no blurry vision, + floaters, no recent vision changes, + wears glasses for reading only  ENT:   no hearing loss, no loose or painful teeth, no dentures, last saw dentist within 6 months  Hematologic:  no easy bruising, no abnormal bleeding, no clotting disorder, no frequent epistaxis  Endocrine:  no diabetes, does not check CBG's at home           Physical Exam:   BP 128/82   Pulse 73   Temp 98.1 F (36.7 C) (Skin)   Resp 20   Ht 6\' 1"  (1.854 m)   Wt (!) 325 lb (147.4 kg)   SpO2 96% Comment: RA  BMI 42.88 kg/m   General:  Obese,  well-appearing  HEENT:  Unremarkable   Neck:   no JVD, no bruits, no adenopathy   Chest:   clear to auscultation, symmetrical breath sounds, no wheezes, no rhonchi   CV:   RRR, grade III/VI crescendo/decrescendo murmur heard best at RSB,  no diastolic  murmur  Abdomen:  soft, non-tender, no masses   Extremities:  warm, well-perfused, pulses diminished but, trace LE edema  Rectal/GU  Deferred  Neuro:   Grossly non-focal and symmetrical throughout  Skin:   Clean and dry, no rashes, no breakdown   Diagnostic Tests:   ECHOCARDIOGRAM REPORT       Patient Name:  ONESIMO LINGARD Full Date of Exam: 06/26/2019  Medical Rec #: 08/24/2019      Height:    73.0 in  Accession #:  [id number]     Weight:    319.0 lb  Date of Birth: [birth year]      BSA:     2.62 m  Patient Age:  53 years      BP:      162/86 mmHg  Patient Gender: M          HR:      65 bpm.  Exam Location:    Procedure: 2D Echo   Indications:  Nonrheumatic aortic valve stenosis [I35.0 (ICD-10-CM)];  Swelling         of lower leg [M79.89 (ICD-10-CM)]    History:    Patient has no prior history of Echocardiogram  examinations.         Signs/Symptoms:Murmur; Risk Factors:Hypertension.    Sonographer:  08-01-1986  Referring Phys: [phone] BRIAN J MUNLEY   IMPRESSIONS    1. Left ventricular ejection fraction, by estimation, is 65 to 70%. The  left ventricle has normal function. The left ventrical has no regional  wall motion abnormalities. There is moderately increased left ventricular  hypertrophy. Left ventricular  diastolic parameters are consistent with Grade II diastolic dysfunction  (pseudonormalization).  2. Left atrial size was mildly dilated.  3. Trivial mitral valve regurgitation.  4. The aortic valve is hickened, moderate capcification noted, restricted  mobility. Can not r/o bicuspid valve. Aortic valve regurgitation is mild  to moderate. Severe aortic valve stenosis. Peak/mean gradient : 80/43  mmHg, AVA 0.85 cm2, DI 0.27.  5. The inferior vena cava is normal in size with greater than 50%  respiratory variability, suggesting right atrial  pressure of 3 mmHg.    FINDINGS  Left Ventricle: Left ventricular ejection fraction, by estimation, is 65  to 70%. The left ventricle has normal function. The left ventricle has no  regional wall motion abnormalities. The left ventricular internal cavity  size was normal in size. There is  moderately increased left ventricular hypertrophy. Left ventricular  diastolic parameters are consistent with Grade II diastolic dysfunction  (pseudonormalization).   Right Ventricle: The right ventricular size is normal. No increase in  right ventricular wall thickness. Right ventricular systolic function is  normal. There is normal pulmonary artery systolic pressure. The tricuspid  regurgitant velocity is 2.42 m/s, and  with an assumed right atrial pressure of 3 mmHg, the estimated right  ventricular systolic pressure is 94.7 mmHg.   Left Atrium: Left atrial size was mildly dilated.   Right Atrium: Right atrial size was normal in size.   Pericardium: There is no evidence of pericardial effusion.   Mitral Valve: The mitral valve is normal in structure and function. Normal  mobility of the mitral valve leaflets. Trivial mitral valve regurgitation.  No evidence of mitral valve stenosis. MV peak gradient, 5.3 mmHg. The mean  mitral valve gradient is 3.0  mmHg.   Tricuspid Valve: The tricuspid valve is normal in structure. Tricuspid  valve regurgitation is not demonstrated. No evidence of tricuspid  stenosis.   Aortic Valve: The aortic valve is normal in structure and function.. There  is moderate thickening and moderate calcification of the aortic valve.  Aortic valve regurgitation is mild to moderate. Aortic regurgitation PHT  measures 671 msec. Severe aortic  stenosis is present. There is moderate thickening of the aortic valve.  There is moderate calcification of the aortic valve. Aortic valve mean  gradient measures 43.0 mmHg. Aortic valve peak gradient measures 80.3  mmHg. Aortic valve area, by VTI  measures  0.85 cm.   Pulmonic Valve: The pulmonic valve was normal in structure. Pulmonic valve  regurgitation is not visualized. No evidence of pulmonic stenosis.   Aorta: The aortic root is normal in size and structure.   Venous: The inferior vena cava is normal in size with greater than 50%  respiratory variability, suggesting right atrial pressure of 3 mmHg. The  inferior vena cava and the hepatic vein show a normal flow pattern.   IAS/Shunts: No atrial level shunt detected by color flow Doppler.     LEFT VENTRICLE  PLAX 2D  LVIDd:     5.10 cm Diastology  LVIDs:     3.30 cm LV e' lateral:  6.42 cm/s  LV PW:     1.80 cm LV E/e' lateral: 20.1  LV IVS:    1.70 cm LV e' medial:  5.22 cm/s  LVOT diam:   2.00 cm LV E/e' medial: 24.7  LV SV:     98.02 ml  LV SV Index:  28.53  LVOT Area:   3.14 cm     RIGHT VENTRICLE     IVC  TAPSE (M-mode): 3.1 cm IVC diam: 1.70 cm   LEFT ATRIUM       Index    RIGHT ATRIUM      Index  LA diam:    4.90 cm 1.87 cm/m RA Area:   15.30 cm  LA Vol (A2C):  107.0 ml 40.78 ml/m RA Volume:  38.80 ml 14.79 ml/m  LA Vol (A4C):  90.8 ml 34.61 ml/m  LA Biplane Vol: 100.0 ml 38.11 ml/m  AORTIC VALVE  AV Area (Vmax):  0.83 cm  AV Area (Vmean):  0.80 cm  AV Area (VTI):   0.85 cm  AV Vmax:      448.00 cm/s  AV Vmean:     308.000 cm/s  AV VTI:      1.150 m  AV Peak Grad:   80.3 mmHg  AV Mean Grad:   43.0 mmHg  LVOT Vmax:     119.00 cm/s  LVOT Vmean:    78.100 cm/s  LVOT VTI:     0.312 m  LVOT/AV VTI ratio: 0.27  AI PHT:      671 msec    AORTA  Ao Root diam: 3.50 cm   MITRAL VALVE             TRICUSPID VALVE  MV Area (PHT): 2.51 cm       TR Peak grad:  23.4 mmHg  MV Peak grad: 5.3 mmHg       TR Vmax:    242.00 cm/s  MV Mean grad: 3.0 mmHg  MV Vmax:    1.15 m/s       SHUNTS  MV  Vmean:   75.3 cm/s       Systemic VTI: 0.31 m  MV Decel Time: 303 msec       Systemic Diam: 2.00 cm  MV E velocity: 129.00 cm/s 103 cm/s  MV A velocity: 80.80 cm/s 70.3 cm/s  MV E/A ratio: 1.60    1.5   Gypsy Balsamobert Krasowski MD  Electronically signed by Gypsy Balsamobert Krasowski MD  Signature Date/Time: 06/26/2019/12:26:31 PM         Impression:  Patient has likely bicuspid aortic valve disease with stage D severe symptomatic aortic stenosis.  He describes a several year history of progressive symptoms of exertional shortness of breath and chest tightness consistent with chronic diastolic congestive heart failure, New York Heart Association functional class II.  He may also be having stable angina pectoris.  I have personally reviewed the patient's recent transthoracic echocardiogram which demonstrates the presence of severe aortic stenosis and normal left ventricular systolic function.  Images are suboptimal but the patient appears to have a type I bicuspid aortic valve.  There is severe calcification with restricted leaflet mobility and severe aortic stenosis.  Peak velocity across aortic valve measured close to 4.5 m/s corresponding to mean transvalvular gradient well above 40 mmHg.  The patient also has mild to moderate aortic insufficiency.  Left ventricular systolic function appears normal.  There is left ventricular hypertrophy with some degree of diastolic dysfunction.  I agree the patient needs aortic valve replacement.   Plan:  The patient was counseled at length regarding treatment alternatives for management of severe aortic stenosis including continued medical therapy versus proceeding with aortic valve replacement in the near future.  The natural history of aortic stenosis was reviewed, as was long term prognosis with medical therapy alone.  Surgical options were discussed at length including conventional surgical aortic valve replacement through either a full median  sternotomy or using minimally invasive techniques.  Other alternatives including rapid-deployment bioprosthetic tissue valve replacement, transcatheter aortic valve replacement, patch enlargement of the aortic root, stentless porcine aortic root replacement, valve repair, the Ross autograft procedure, and homograft aortic root replacement were discussed.  Discussion was held comparing the relative risks of mechanical valve replacement with need for lifelong anticoagulation versus use of a bioprosthetic tissue valve and the associated potential for late structural valve deterioration and failure.    Patient is interested in proceeding with further diagnostic  testing and surgical intervention.  As a neck step the patient will undergo diagnostic cardiac catheterization to rule out the presence of significant coronary artery disease and evaluate right heart pressures.  Patient will also undergo cardiac gated CT angiogram of the heart and CT angiography of the aorta and iliac vessels to further evaluate treatment options including transcatheter aortic valve replacement and/or conventional surgery via minimally invasive techniques.  Finally, the patient will undergo pulmonary function testing.  The patient has been strongly advised to quit smoking.    I spent in excess of 90 minutes during the conduct of this office consultation and >50% of this time involved direct face-to-face encounter with the patient for counseling and/or coordination of their care.    Salvatore Decent. Cornelius Moras, MD 07/23/2019 2:53 PM

## 2019-07-23 NOTE — Patient Instructions (Addendum)
Continue all previous medications without any changes at this time  Stop smoking immediately and permanently.  

## 2019-07-25 ENCOUNTER — Telehealth: Payer: Self-pay

## 2019-07-25 DIAGNOSIS — I350 Nonrheumatic aortic (valve) stenosis: Secondary | ICD-10-CM

## 2019-07-25 NOTE — Telephone Encounter (Signed)
Called patient to arrange [facility] for AS/CAD risk factors.   Left message to call back.

## 2019-07-25 NOTE — Telephone Encounter (Signed)
-----   Message from Tonny Bollman, MD sent at 07/24/2019  9:51 AM EST ----- Sure. Orpha Bur can you set him up with me? thx ----- Message ----- From: Purcell Nails, MD Sent: 07/23/2019   3:57 PM EST To: Iona Coach, RN, Tonny Bollman, MD  Kathlene November - can you get this guy in for L+R cath - he's got bad AS and risk factors for CAD - thx

## 2019-07-26 NOTE — Telephone Encounter (Signed)
Scheduled patient for [facility] 3/16 with Dr. Excell Seltzer. Labs and COVID screen will be completed 3/15. Instructions reviewed in detail. He will sign up for MyChart so his instructions are viewable. He was grateful for assistance and agrees with treatment plan.

## 2019-07-26 NOTE — Telephone Encounter (Signed)
Left message to call back  

## 2019-07-30 ENCOUNTER — Other Ambulatory Visit: Payer: Self-pay

## 2019-07-30 ENCOUNTER — Telehealth: Payer: Self-pay | Admitting: *Deleted

## 2019-07-30 ENCOUNTER — Other Ambulatory Visit: Payer: 59 | Admitting: *Deleted

## 2019-07-30 ENCOUNTER — Other Ambulatory Visit (HOSPITAL_COMMUNITY)
Admission: RE | Admit: 2019-07-30 | Discharge: 2019-07-30 | Disposition: A | Discharge status: Home / Self Care | Payer: 59 | Source: Ambulatory Visit | Attending: Cardiovascular Disease | Admitting: Cardiovascular Disease

## 2019-07-30 DIAGNOSIS — Z01812 Encounter for preprocedural laboratory examination: Secondary | ICD-10-CM | POA: Diagnosis not present

## 2019-07-30 DIAGNOSIS — I350 Nonrheumatic aortic (valve) stenosis: Secondary | ICD-10-CM

## 2019-07-30 DIAGNOSIS — Z20822 Contact with and (suspected) exposure to covid-19: Secondary | ICD-10-CM | POA: Diagnosis not present

## 2019-07-30 LAB — BASIC METABOLIC PANEL
BUN/Creatinine Ratio: 14 (ref 9–20)
BUN: 15 mg/dL (ref 6–24)
CO2: 28 mmol/L (ref 20–29)
Calcium: 9.1 mg/dL (ref 8.7–10.2)
Chloride: 99 mmol/L (ref 96–106)
Creatinine, Ser: 1.08 mg/dL (ref 0.76–1.27)
GFR calc Af Amer: 91 mL/min/{1.73_m2} (ref >59)
GFR calc non Af Amer: 78 mL/min/{1.73_m2} (ref >59)
Glucose: 155 mg/dL — ABNORMAL HIGH (ref 65–99)
Potassium: 4.2 mmol/L (ref 3.5–5.2)
Sodium: 136 mmol/L (ref 134–144)

## 2019-07-30 LAB — CBC WITH DIFFERENTIAL/PLATELET
Basophils Absolute: 0 10*3/uL (ref 0.0–0.2)
Basos: 1 %
EOS (ABSOLUTE): 0.2 10*3/uL (ref 0.0–0.4)
Eos: 2 %
Hematocrit: 45.8 % (ref 37.5–51.0)
Hemoglobin: 15.5 g/dL (ref 13.0–17.7)
Lymphocytes Absolute: 1.7 10*3/uL (ref 0.7–3.1)
Lymphs: 20 %
MCH: 30.3 pg (ref 26.6–33.0)
MCHC: 33.8 g/dL (ref 31.5–35.7)
MCV: 90 fL (ref 79–97)
Monocytes Absolute: 0.9 10*3/uL (ref 0.1–0.9)
Monocytes: 10 %
Neutrophils Absolute: 6 10*3/uL (ref 1.4–7.0)
Neutrophils: 67 %
Platelets: 225 10*3/uL (ref 150–450)
RBC: 5.11 x10E6/uL (ref 4.14–5.80)
RDW: 13.4 % (ref 11.6–15.4)
WBC: 8.8 10*3/uL (ref 3.4–10.8)

## 2019-07-30 LAB — SARS CORONAVIRUS 2 (TAT 6-24 HRS): SARS Coronavirus 2: NEGATIVE

## 2019-07-30 NOTE — Telephone Encounter (Signed)
Pt contacted pre-catheterization scheduled at [facility] for: Tuesday July 31, 2019 7:30 AM Verified arrival time and place: [facility] Main Entrance A [facility]) at: 5:30 AM   No solid food after midnight prior to cath, clear liquids until 5 AM day of procedure. Contrast allergy: no  Hold: HCTZ-AM of procedure Spironolactone-AM of procedure  Except hold medications AM meds can be  taken pre-cath with sip of water including: ASA 81 mg   Confirmed patient has responsible adult to drive home post procedure and observe 24 hours after arriving home: yes  Currently, due to Covid-19 pandemic, only one person will be allowed with patient. Must be the same person for patient's entire stay and will be required to wear a mask. They will be asked to wait in the waiting room for the duration of the patient's stay.  Patients are required to wear a mask when they enter the hospital.      COVID-19 Pre-Screening Questions:  . In the past 7 to 10 days have you had a cough,  shortness of breath, headache, congestion, fever (100 or greater) body aches, chills, sore throat, or sudden loss of taste or sense of smell? no . Have you been around anyone with known Covid 19 in the past 7-10 days? no . Have you been around anyone who is awaiting Covid 19 test results in the past 7 to 10 days? no . Have you been around anyone who has been exposed to Covid 19, or has mentioned symptoms of Covid 19 within the past 7 to 10 days? no  I reviewed procedure/mask/visitor instructions, COVID-19 screening questions with patient, he verbalized understanding, thanked me for call.

## 2019-07-31 ENCOUNTER — Other Ambulatory Visit: Payer: Self-pay

## 2019-07-31 ENCOUNTER — Ambulatory Visit (HOSPITAL_COMMUNITY)
Admission: RE | Admit: 2019-07-31 | Discharge: 2019-07-31 | Disposition: A | Discharge status: Home / Self Care | Payer: 59 | Attending: Cardiovascular Disease | Admitting: Cardiovascular Disease

## 2019-07-31 ENCOUNTER — Encounter (HOSPITAL_COMMUNITY)
Admission: RE | Disposition: A | Discharge status: Home / Self Care | Payer: Self-pay | Source: Home / Self Care | Attending: Cardiovascular Disease

## 2019-07-31 DIAGNOSIS — Z6841 Body Mass Index (BMI) 40.0 and over, adult: Secondary | ICD-10-CM | POA: Insufficient documentation

## 2019-07-31 DIAGNOSIS — Z79899 Other long term (current) drug therapy: Secondary | ICD-10-CM | POA: Diagnosis not present

## 2019-07-31 DIAGNOSIS — I350 Nonrheumatic aortic (valve) stenosis: Secondary | ICD-10-CM | POA: Diagnosis not present

## 2019-07-31 DIAGNOSIS — I10 Essential (primary) hypertension: Secondary | ICD-10-CM | POA: Insufficient documentation

## 2019-07-31 DIAGNOSIS — I2582 Chronic total occlusion of coronary artery: Secondary | ICD-10-CM | POA: Diagnosis not present

## 2019-07-31 DIAGNOSIS — F17210 Nicotine dependence, cigarettes, uncomplicated: Secondary | ICD-10-CM | POA: Diagnosis not present

## 2019-07-31 DIAGNOSIS — I352 Nonrheumatic aortic (valve) stenosis with insufficiency: Secondary | ICD-10-CM | POA: Diagnosis not present

## 2019-07-31 DIAGNOSIS — I2510 Atherosclerotic heart disease of native coronary artery without angina pectoris: Secondary | ICD-10-CM | POA: Insufficient documentation

## 2019-07-31 HISTORY — PX: RIGHT/LEFT HEART CATH AND CORONARY ANGIOGRAPHY: CATH118266

## 2019-07-31 SURGERY — RIGHT/LEFT HEART CATH AND CORONARY ANGIOGRAPHY
Anesthesia: LOCAL

## 2019-07-31 MED ORDER — SODIUM CHLORIDE 0.9 % IV SOLN: 250.0000 mL | INTRAVENOUS | Status: DC | PRN

## 2019-07-31 MED ORDER — VERAPAMIL HCL 2.5 MG/ML IV SOLN
INTRAVENOUS | Status: AC
  Filled 2019-07-31: qty 2

## 2019-07-31 MED ORDER — ASPIRIN 81 MG PO CHEW: 81.0000 mg | CHEWABLE_TABLET | ORAL | Status: DC

## 2019-07-31 MED ORDER — SODIUM CHLORIDE 0.9 % WEIGHT BASED INFUSION
3.0000 mL/kg/h | INTRAVENOUS | Status: AC
  Administered 2019-07-31: 3 mL/kg/h via INTRAVENOUS

## 2019-07-31 MED ORDER — MIDAZOLAM HCL 2 MG/2ML IJ SOLN
INTRAMUSCULAR | Status: AC
  Filled 2019-07-31: qty 2

## 2019-07-31 MED ORDER — SODIUM CHLORIDE 0.9% FLUSH: 3.0000 mL | Freq: Two times a day (BID) | INTRAVENOUS | Status: DC

## 2019-07-31 MED ORDER — FENTANYL CITRATE (PF) 100 MCG/2ML IJ SOLN
INTRAMUSCULAR | Status: AC
  Filled 2019-07-31: qty 2

## 2019-07-31 MED ORDER — HEPARIN (PORCINE) IN NACL 1000-0.9 UT/500ML-% IV SOLN
INTRAVENOUS | Status: AC
  Filled 2019-07-31: qty 1000

## 2019-07-31 MED ORDER — SODIUM CHLORIDE 0.9 % IV SOLN: INTRAVENOUS | Status: DC

## 2019-07-31 MED ORDER — SODIUM CHLORIDE 0.9% FLUSH: 3.0000 mL | INTRAVENOUS | Status: DC | PRN

## 2019-07-31 MED ORDER — FENTANYL CITRATE (PF) 100 MCG/2ML IJ SOLN
INTRAMUSCULAR | Status: DC | PRN
  Administered 2019-07-31: 25 ug via INTRAVENOUS

## 2019-07-31 MED ORDER — LIDOCAINE HCL (PF) 1 % IJ SOLN
INTRAMUSCULAR | Status: AC
  Filled 2019-07-31: qty 30

## 2019-07-31 MED ORDER — SODIUM CHLORIDE 0.9 % WEIGHT BASED INFUSION: 1.0000 mL/kg/h | INTRAVENOUS | Status: DC

## 2019-07-31 MED ORDER — HEPARIN (PORCINE) IN NACL 1000-0.9 UT/500ML-% IV SOLN
INTRAVENOUS | Status: DC | PRN
  Administered 2019-07-31 (×2): 500 mL

## 2019-07-31 MED ORDER — MIDAZOLAM HCL 2 MG/2ML IJ SOLN
INTRAMUSCULAR | Status: DC | PRN
  Administered 2019-07-31 (×2): 1 mg via INTRAVENOUS

## 2019-07-31 MED ORDER — HEPARIN SODIUM (PORCINE) 1000 UNIT/ML IJ SOLN
INTRAMUSCULAR | Status: AC
  Filled 2019-07-31: qty 1

## 2019-07-31 MED ORDER — IOHEXOL 350 MG/ML SOLN
INTRAVENOUS | Status: DC | PRN
  Administered 2019-07-31: 65 mL

## 2019-07-31 MED ORDER — VERAPAMIL HCL 2.5 MG/ML IV SOLN
INTRAVENOUS | Status: DC | PRN
  Administered 2019-07-31: 10 mL via INTRA_ARTERIAL

## 2019-07-31 MED ORDER — LABETALOL HCL 5 MG/ML IV SOLN: 10.0000 mg | INTRAVENOUS | Status: DC | PRN

## 2019-07-31 MED ORDER — HEPARIN SODIUM (PORCINE) 1000 UNIT/ML IJ SOLN
INTRAMUSCULAR | Status: DC | PRN
  Administered 2019-07-31: 6000 [IU] via INTRAVENOUS

## 2019-07-31 MED ORDER — ONDANSETRON HCL 4 MG/2ML IJ SOLN: 4.0000 mg | Freq: Four times a day (QID) | INTRAMUSCULAR | Status: DC | PRN

## 2019-07-31 MED ORDER — HYDRALAZINE HCL 20 MG/ML IJ SOLN: 10.0000 mg | INTRAMUSCULAR | Status: DC | PRN

## 2019-07-31 MED ORDER — ACETAMINOPHEN 325 MG PO TABS: 650.0000 mg | ORAL_TABLET | ORAL | Status: DC | PRN

## 2019-07-31 MED ORDER — LIDOCAINE HCL (PF) 1 % IJ SOLN
INTRAMUSCULAR | Status: DC | PRN
  Administered 2019-07-31: 2 mL via INTRADERMAL
  Administered 2019-07-31: 3 mL via INTRADERMAL

## 2019-07-31 SURGICAL SUPPLY — 13 items
CATH 5FR JL3.5 JR4 ANG PIG MP (CATHETERS) ×1 IMPLANT
CATH BALLN WEDGE 5F 110CM (CATHETERS) ×1 IMPLANT
CATH INFINITI 5FR AL1 (CATHETERS) ×1 IMPLANT
DEVICE RAD COMP TR BAND LRG (VASCULAR PRODUCTS) ×1 IMPLANT
GLIDESHEATH SLEND SS 6F .021 (SHEATH) ×1 IMPLANT
GUIDEWIRE INQWIRE 1.5J.035X260 (WIRE) IMPLANT
INQWIRE 1.5J .035X260CM (WIRE) ×2
KIT HEART LEFT (KITS) ×2 IMPLANT
PACK CARDIAC CATHETERIZATION (CUSTOM PROCEDURE TRAY) ×2 IMPLANT
SHEATH GLIDE SLENDER 4/5FR (SHEATH) ×1 IMPLANT
TRANSDUCER W/STOPCOCK (MISCELLANEOUS) ×2 IMPLANT
TUBING CIL FLEX 10 FLL-RA (TUBING) ×2 IMPLANT
WIRE EMERALD ST .035X150CM (WIRE) ×1 IMPLANT

## 2019-07-31 NOTE — Interval H&P Note (Signed)
History and Physical Interval Note:  07/31/2019 5:49 AM  Antonio Riley  has presented today for surgery, with the diagnosis of arortic stenosis.  The various methods of treatment have been discussed with the patient and family. After consideration of risks, benefits and other options for treatment, the patient has consented to  Procedure(s): RIGHT/LEFT HEART CATH AND CORONARY ANGIOGRAPHY (N/A) as a surgical intervention.  The patient's history has been reviewed, patient examined, no change in status, stable for surgery.  I have reviewed the patient's chart and labs.  Questions were answered to the patient's satisfaction.     Tonny Bollman

## 2019-07-31 NOTE — Discharge Instructions (Signed)
Radial Site Care  This sheet gives you information about how to care for yourself after your procedure. Your health care provider may also give you more specific instructions. If you have problems or questions, contact your health care provider. What can I expect after the procedure? After the procedure, it is common to have:  Bruising and tenderness at the catheter insertion area. Follow these instructions at home: Medicines  Take over-the-counter and prescription medicines only as told by your health care provider. Insertion site care  Follow instructions from your health care provider about how to take care of your insertion site. Make sure you: ? Wash your hands with soap and water before you change your bandage (dressing). If soap and water are not available, use hand sanitizer. ? Change your dressing as told by your health care provider.  Check your insertion site every day for signs of infection. Check for: ? Redness, swelling, or pain. ? Fluid or blood. ? Pus or a bad smell. ? Warmth.  Do not take baths, swim, or use a hot tub until your health care provider approves.  You may shower 24-48 hours after the procedure, or as directed by your health care provider. ? Remove the dressing and gently wash the site with plain soap and water. ? Pat the area dry with a clean towel. ? Do not rub the site. That could cause bleeding.  Do not apply powder or lotion to the site. Activity   For 24 hours after the procedure, or as directed by your health care provider: ? Do not flex or bend the affected arm. ? Do not push or pull heavy objects with the affected arm. ? Do not drive yourself home from the hospital or clinic. You may drive 24 hours after the procedure unless your health care provider tells you not to. ? Do not operate machinery or power tools.  Do not lift anything that is heavier than 10 lb (4.5 kg), or the limit that you are told, until your health care provider says  that it is safe.  Ask your health care provider when it is okay to: ? Return to work or school. ? Resume usual physical activities or sports. ? Resume sexual activity. General instructions  If the catheter site starts to bleed, raise your arm and put firm pressure on the site. If the bleeding does not stop, get help right away. This is a medical emergency.  If you went home on the same day as your procedure, a responsible adult should be with you for the first 24 hours after you arrive home.  Keep all follow-up visits as told by your health care provider. This is important. Contact a health care provider if:  You have a fever.  You have redness, swelling, or yellow drainage around your insertion site. Get help right away if:  You have unusual pain at the radial site.  The catheter insertion area swells very fast.  The insertion area is bleeding, and the bleeding does not stop when you hold steady pressure on the area.  Your arm or hand becomes pale, cool, tingly, or numb. These symptoms may represent a serious problem that is an emergency. Do not wait to see if the symptoms will go away. Get medical help right away. Call your local emergency services (911 in the [place] Do not drive yourself to the hospital. Summary  After the procedure, it is common to have bruising and tenderness at the site.  Follow instructions from your  health care provider about how to take care of your radial site wound. Check the wound every day for signs of infection.  Do not lift anything that is heavier than 10 lb (4.5 kg), or the limit that you are told, until your health care provider says that it is safe. This information is not intended to replace advice given to you by your health care provider. Make sure you discuss any questions you have with your health care provider. Document Revised: 06/08/2017 Document Reviewed: 06/08/2017 Elsevier Patient Education  2020 [employer].

## 2019-08-01 LAB — POCT I-STAT EG7
Acid-Base Excess: 1 mmol/L (ref 0.0–2.0)
Acid-Base Excess: 2 mmol/L (ref 0.0–2.0)
Bicarbonate: 26.8 mmol/L (ref 20.0–28.0)
Bicarbonate: 27.7 mmol/L (ref 20.0–28.0)
Calcium, Ion: 1.13 mmol/L — ABNORMAL LOW (ref 1.15–1.40)
Calcium, Ion: 1.23 mmol/L (ref 1.15–1.40)
HCT: 41 % (ref 39.0–52.0)
HCT: 42 % (ref 39.0–52.0)
Hemoglobin: 13.9 g/dL (ref 13.0–17.0)
Hemoglobin: 14.3 g/dL (ref 13.0–17.0)
O2 Saturation: 73 %
O2 Saturation: 75 %
Potassium: 4.2 mmol/L (ref 3.5–5.1)
Potassium: 4.5 mmol/L (ref 3.5–5.1)
Sodium: 136 mmol/L (ref 135–145)
Sodium: 138 mmol/L (ref 135–145)
TCO2: 28 mmol/L (ref 22–32)
TCO2: 29 mmol/L (ref 22–32)
pCO2, Ven: 47 mmHg (ref 44.0–60.0)
pCO2, Ven: 47.3 mmHg (ref 44.0–60.0)
pH, Ven: 7.364 (ref 7.250–7.430)
pH, Ven: 7.376 (ref 7.250–7.430)
pO2, Ven: 40 mmHg (ref 32.0–45.0)
pO2, Ven: 42 mmHg (ref 32.0–45.0)

## 2019-08-06 ENCOUNTER — Other Ambulatory Visit (HOSPITAL_COMMUNITY)
Admission: RE | Admit: 2019-08-06 | Discharge: 2019-08-06 | Disposition: A | Discharge status: Home / Self Care | Payer: 59 | Source: Ambulatory Visit | Attending: Thoracic Surgery (Cardiothoracic Vascular Surgery) | Admitting: Thoracic Surgery (Cardiothoracic Vascular Surgery)

## 2019-08-06 DIAGNOSIS — Z20822 Contact with and (suspected) exposure to covid-19: Secondary | ICD-10-CM | POA: Insufficient documentation

## 2019-08-06 DIAGNOSIS — Z01812 Encounter for preprocedural laboratory examination: Secondary | ICD-10-CM | POA: Diagnosis present

## 2019-08-07 ENCOUNTER — Telehealth (HOSPITAL_COMMUNITY): Payer: Self-pay | Admitting: Emergency Medicine

## 2019-08-07 ENCOUNTER — Encounter (HOSPITAL_COMMUNITY): Payer: Self-pay

## 2019-08-07 LAB — SARS CORONAVIRUS 2 (TAT 6-24 HRS): SARS Coronavirus 2: NEGATIVE

## 2019-08-07 NOTE — Telephone Encounter (Signed)
Attempted to call patient regarding upcoming cardiac CT appointment. °Left message on voicemail with name and callback number °Maliaka Brasington RN Navigator Cardiac Imaging °[facility] °[phone] Office °[phone] Cell ° °

## 2019-08-08 ENCOUNTER — Ambulatory Visit (HOSPITAL_COMMUNITY)
Admission: RE | Admit: 2019-08-08 | Discharge: 2019-08-08 | Disposition: A | Discharge status: Home / Self Care | Payer: 59 | Source: Ambulatory Visit | Attending: Thoracic Surgery (Cardiothoracic Vascular Surgery) | Admitting: Thoracic Surgery (Cardiothoracic Vascular Surgery)

## 2019-08-08 ENCOUNTER — Other Ambulatory Visit: Payer: Self-pay

## 2019-08-08 DIAGNOSIS — I350 Nonrheumatic aortic (valve) stenosis: Secondary | ICD-10-CM | POA: Insufficient documentation

## 2019-08-08 LAB — PULMONARY FUNCTION TEST
DL/VA % pred: 99 %
DL/VA: 4.34 ml/min/mmHg/L
DLCO cor % pred: 94 %
DLCO cor: 30.05 ml/min/mmHg
DLCO unc % pred: 96 %
DLCO unc: 30.79 ml/min/mmHg
FEF 25-75 Post: 2.69 L/sec
FEF 25-75 Pre: 2.21 L/sec
FEF2575-%Change-Post: 21 %
FEF2575-%Pred-Post: 73 %
FEF2575-%Pred-Pre: 60 %
FEV1-%Change-Post: 6 %
FEV1-%Pred-Post: 82 %
FEV1-%Pred-Pre: 77 %
FEV1-Post: 3.52 L
FEV1-Pre: 3.31 L
FEV1FVC-%Change-Post: 2 %
FEV1FVC-%Pred-Pre: 90 %
FEV6-%Change-Post: 3 %
FEV6-%Pred-Post: 90 %
FEV6-%Pred-Pre: 87 %
FEV6-Post: 4.83 L
FEV6-Pre: 4.66 L
FEV6FVC-%Change-Post: 0 %
FEV6FVC-%Pred-Post: 101 %
FEV6FVC-%Pred-Pre: 102 %
FVC-%Change-Post: 4 %
FVC-%Pred-Post: 89 %
FVC-%Pred-Pre: 85 %
FVC-Post: 4.95 L
FVC-Pre: 4.74 L
Post FEV1/FVC ratio: 71 %
Post FEV6/FVC ratio: 98 %
Pre FEV1/FVC ratio: 70 %
Pre FEV6/FVC Ratio: 98 %
RV % pred: 136 %
RV: 3.05 L
TLC % pred: 108 %
TLC: 8.23 L

## 2019-08-08 MED ORDER — ALBUTEROL SULFATE (2.5 MG/3ML) 0.083% IN NEBU
2.5000 mg | INHALATION_SOLUTION | Freq: Once | RESPIRATORY_TRACT | Status: AC
  Administered 2019-08-08: 12:00:00 2.5 mg via RESPIRATORY_TRACT

## 2019-08-08 MED ORDER — IOHEXOL 350 MG/ML SOLN
100.0000 mL | Freq: Once | INTRAVENOUS | Status: AC | PRN
  Administered 2019-08-08: 15:00:00 100 mL via INTRAVENOUS

## 2019-08-13 ENCOUNTER — Encounter: Payer: Self-pay | Admitting: *Deleted

## 2019-08-13 ENCOUNTER — Encounter: Payer: Self-pay | Admitting: Thoracic Surgery (Cardiothoracic Vascular Surgery)

## 2019-08-13 ENCOUNTER — Ambulatory Visit: Payer: 59 | Admitting: Thoracic Surgery (Cardiothoracic Vascular Surgery)

## 2019-08-13 ENCOUNTER — Other Ambulatory Visit: Payer: Self-pay

## 2019-08-13 ENCOUNTER — Other Ambulatory Visit: Payer: Self-pay | Admitting: *Deleted

## 2019-08-13 VITALS — BP 130/86 | HR 60 | Temp 97.7°F | Resp 20 | Ht 73.0 in | Wt 325.0 lb

## 2019-08-13 DIAGNOSIS — I25709 Atherosclerosis of coronary artery bypass graft(s), unspecified, with unspecified angina pectoris: Secondary | ICD-10-CM

## 2019-08-13 DIAGNOSIS — I2510 Atherosclerotic heart disease of native coronary artery without angina pectoris: Secondary | ICD-10-CM | POA: Insufficient documentation

## 2019-08-13 DIAGNOSIS — I350 Nonrheumatic aortic (valve) stenosis: Secondary | ICD-10-CM

## 2019-08-13 NOTE — Progress Notes (Signed)
[address]       [name] [address]             [phone]     CARDIOTHORACIC SURGERY OFFICE NOTE  Primary Cardiologist is Norman Herrlich, MD PCP is Baldo Ash, FNP   HPI:  Patient is a 54 year old male who returns the office today for follow-up of severe symptomatic aortic stenosis.  He was originally seen in consultation on July 23, 2019.  Since then he underwent diagnostic cardiac catheterization by Dr. Dulce Sellar on July 31, 2019.  Catheterization confirmed the presence of at least moderate aortic stenosis and also revealed severe two-vessel coronary artery disease with 100% chronic occlusion of the right coronary artery and a high-grade stenosis of proximal left circumflex coronary artery.  Right heart pressures were normal.  CT angiography was performed on the patient returns to our office for follow-up today.  He reports no new problems or complaints although he continues to experience symptoms of exertional chest pain and shortness of breath consistent with both chronic diastolic congestive heart failure and likely stable angina pectoris.   Current Outpatient Medications  Medication Sig Dispense Refill  . Boswellia-Glucosamine-Vit D (OSTEO BI-FLEX ONE PER DAY PO) Take 1 tablet by mouth daily.     . Calcium Carbonate-Vitamin D (CALCIUM 600/VITAMIN D PO) Take 1 tablet by mouth daily.    . carvedilol (COREG) 25 MG tablet Take 1 tablet (25 mg total) by mouth 2 (two) times daily. 180 tablet 1  . hydrochlorothiazide (HYDRODIURIL) 25 MG tablet Take 1 tablet (25 mg total) by mouth daily. 90 tablet 1  . lisinopril (ZESTRIL) 20 MG tablet Take 1 tablet (20 mg total) by mouth daily. 90 tablet 1  . omeprazole (PRILOSEC OTC) 20 MG tablet Take 20 mg by mouth daily.    Marland Kitchen OVER THE COUNTER MEDICATION Take 1 tablet by mouth daily. Allergy Medicine    . rosuvastatin (CRESTOR) 5 MG tablet Take 1 tablet (5 mg total) by mouth daily. 90 tablet 1  . spironolactone (ALDACTONE) 25  MG tablet Take 1 tablet (25 mg total) by mouth daily. 90 tablet 1  . amoxicillin (AMOXIL) 500 MG capsule Take 4 capsules (2,000 mg) 45 minutes before dental procedures. 20 capsule 0   No current facility-administered medications for this visit.      Physical Exam:   BP 130/86 (BP Location: Right Arm)   Pulse 60   Temp 97.7 F (36.5 C) (Temporal)   Resp 20   Ht 6\' 1"  (1.854 m)   Wt (!) 325 lb (147.4 kg)   SpO2 97% Comment: RA  BMI 42.88 kg/m   General:  Obese but well-appearing  Chest:   Clear to auscultation  CV:   Regular rate and rhythm with systolic murmur  Incisions:  n/a  Abdomen:  Soft nontender  Extremities:  Warm and well-perfused  Diagnostic Tests:  RIGHT/LEFT HEART CATH AND CORONARY ANGIOGRAPHY  Conclusion  1.  Two-vessel coronary artery disease with total occlusion of the right coronary artery, the PDA and PLA branches fill late from right to right collaterals and faint left-to-right collaterals.  Moderate stenosis of the proximal circumflex beyond the intermediate branch origin 2.  Widely patent left main, LAD, and ramus intermedius with only mild nonobstructive disease 3.  Moderate aortic stenosis with mean gradient 32 mmHg and calculated aortic valve area 1.49 sq cm 4.  Essentially normal right heart hemodynamics with preserved cardiac output  Recommendations: Continued surgical evaluation for AVR/CABG in  this symptomatic patient with severe aortic stenosis by echo criteria  Indications  Severe aortic stenosis [I35.0 (ICD-10-CM)]  Procedural Details  Technical Details INDICATION: Severe symptomatic aortic stenosis (preoperative cath study)  PROCEDURAL DETAILS: There was an indwelling IV in a right antecubital vein. Using normal sterile technique, the IV was changed out for a 5 Fr brachial sheath over a 0.018 inch wire. The right wrist was then prepped, draped, and anesthetized with 1% lidocaine. Using the modified Seldinger technique a 5/6 French Slender  sheath was placed in the right radial artery. Intra-arterial verapamil was administered through the radial artery sheath. IV heparin was administered after a JR4 catheter was advanced into the central aorta. A Swan-Ganz catheter was used for the right heart catheterization. Standard protocol was followed for recording of right heart pressures and sampling of oxygen saturations. Fick cardiac output was calculated. Standard Judkins catheters were used for selective coronary angiography. LV pressure is recorded and an aortic valve pullback is performed.  The aortic valve is crossed with an AL-1 catheter and straight tip wire.  There were no immediate procedural complications. The patient was transferred to the post catheterization recovery area for further monitoring.    Estimated blood loss <50 mL.   During this procedure medications were administered to achieve and maintain moderate conscious sedation while the patient's heart rate, blood pressure, and oxygen saturation were continuously monitored and I was present face-to-face 100% of this time.   Radiation/Fluoro  Fluoro time: 9.6 (min) DAP: 61207 (mGycm2) Cumulative Air Kerma: 969 (mGy)  Coronary Findings  Diagnostic Dominance: Right Left Anterior Descending  There is mild diffuse disease throughout the vessel. Large vessel, wraps around the LV apex, mild diffuse irregularity with no significant stenosis throughout. The 1st diagonal branch is relatively small in caliber with no significant stenosis present.  Ramus Intermedius  Ramus lesion 30% stenosed  Ramus lesion is 30% stenosed.  Left Circumflex  Ost Cx to Prox Cx lesion 60% stenosed  Ost Cx to Prox Cx lesion is 60% stenosed. There is moderate stenosis at the ostium of the circumflex just beyond the intermediate origin. This vessel supplies a single posterolateral branch of significance and a 2nd very small PL branch.  Right Coronary Artery  Prox RCA to Mid RCA lesion 100% stenosed    Prox RCA to Mid RCA lesion is 100% stenosed. Dominant vessel, total occlusion in the midportion at the origin of the 1st RV marginal branch. The distal RCA and branch vessels are filled late by right to right and left to right collaterals. The PDA and PL branches are not well visualized via either collateral system.  Right Posterior Descending Artery  Collaterals  RPDA filled by collaterals from RV Branch.    Second Right Posterolateral Branch  Collaterals  2nd RPL filled by collaterals from RV Branch.    Third Right Posterolateral Branch  Collaterals  3rd RPL filled by collaterals from 2nd Sept.    Intervention  No interventions have been documented. Left Heart  Aortic Valve There is moderate aortic valve stenosis. There is restricted aortic valve motion. Mean transvalvular gradient 32 mmHg, calculated aortic valve area 1.49 sq cm  Coronary Diagrams  Diagnostic Dominance: Right  Intervention  Implants   No implant documentation for this case.  Syngo Images  Show images for CARDIAC CATHETERIZATION  Images on Long Term Storage  Show images for Bollard, Penny Darrell "Darrell"   Link to Procedure Log  Procedure Log    Hemo Data   Most Recent  Value  Fick Cardiac Output 6.67 L/min  Fick Cardiac Output Index 2.52 (L/min)/BSA  Aortic Mean Gradient 31.7 mmHg  Aortic Peak Gradient 33 mmHg  Aortic Valve Area 1.49  Aortic Value Area Index 0.57 cm2/BSA  RA A Wave 7 mmHg  RA V Wave 6 mmHg  RA Mean 6 mmHg  RV Systolic Pressure 34 mmHg  RV Diastolic Pressure 5 mmHg  RV EDP 9 mmHg  PA Systolic Pressure 34 mmHg  PA Diastolic Pressure 15 mmHg  PA Mean 22 mmHg  PW A Wave 23 mmHg  PW V Wave 24 mmHg  PW Mean 18 mmHg  AO Systolic Pressure 130 mmHg  AO Diastolic Pressure 71 mmHg  AO Mean 95 mmHg  LV Systolic Pressure 162 mmHg  LV Diastolic Pressure 6 mmHg  LV EDP 18 mmHg  AOp Systolic Pressure 133 mmHg  AOp Diastolic Pressure 64 mmHg  AOp Mean Pressure 91 mmHg  LVp  Systolic Pressure 166 mmHg  LVp Diastolic Pressure 7 mmHg  LVp EDP Pressure 18 mmHg  QP/QS 0.93  TPVR Index 8.71 HRUI  TSVR Index 37.63 HRUI  PVR SVR Ratio 0.04  TPVR/TSVR Ratio 0.23     Cardiac TAVR CT  TECHNIQUE: The patient was scanned on a Siemens Force 192 slice scanner. A 120 kV retrospective scan was triggered in the descending thoracic aorta at 111 HU's. Gantry rotation speed was 270 msecs and collimation was .9 mm. No beta blockade or nitro were given. The 3D data set was reconstructed in 5% intervals of the R-R cycle. Systolic and diastolic phases were analyzed on a dedicated work station using MPR, MIP and VRT modes. The patient received OMNIPAQUE IOHEXOL 350 MG/ML SOLN of contrast.  FINDINGS: Aortic Valve: Trileaflet aortic valve. Moderately reduced cusp separation. Severely thickened, severely calcified aortic valve cusps.  AV calcium score: 2305  Virtual Basal Annulus Measurements:  Maximum/Minimum Diameter: 30.7 x 25.8 mm  Perimeter: 86.9 mm  Area: 577 mm  LVOT calcification posteriorly.  Based on these measurements, the annulus would be suitable for a 29 mm Sapien 3 valve.  Sinus of Valsalva Measurements:  Non-coronary:  34 mm  Right - coronary:  33 mm  Left - coronary:  36 mm  Sinus of Valsalva Height:  Left: 21.7 mm  Right: 19.5 mm  Aorta: Conventional 3 vessel branch pattern of the aortic arch. No significant aorta calcifications.  Sinotubular Junction:  30 mm  Ascending Thoracic Aorta:  36 mm  Aortic Arch:  26 mm  Descending Thoracic Aorta:  23 mm  Coronary Artery Height above Annulus:  Left Main: 15.2 mm  Right Coronary: 16 mm  Coronary Arteries: 3 vessel coronary artery calcifications.  Optimum Fluoroscopic Angle for Delivery: RAO 1, CRA 1  No left atrial appendage thrombus.  IMPRESSION: 1. Trileaflet aortic valve. Moderately reduced cusp separation. Severely thickened, severely  calcified aortic valve cusps.  2.  AV calcium score: 2305  3. Annulus area 577 mm, suitable for a 29 mm Sapien 3 valve. Posterior LVOT calcification.  4.  Normal caliber thoracic aorta, no calcifications.  5.  Adequate coronary heights from the annulus.  6. Optimum Fluoroscopic Angle for Delivery: RAO 1, CRA 1   Electronically Signed   By: Weston Brass   On: 08/12/2019 20:05       CT ANGIOGRAPHY CHEST, ABDOMEN AND PELVIS  TECHNIQUE: Multidetector CT imaging through the chest, abdomen and pelvis was performed using the standard protocol during bolus administration of intravenous contrast. Multiplanar reconstructed images and  MIPs were obtained and reviewed to evaluate the vascular anatomy.  CONTRAST:  OMNIPAQUE IOHEXOL 350 MG/ML SOLN  COMPARISON:  None.  FINDINGS: CTA CHEST FINDINGS  Cardiovascular: Top-normal heart size. Diffuse thickening and coarse calcification of the aortic valve. No significant pericardial effusion/thickening. Left anterior descending and left circumflex coronary atherosclerosis. Atherosclerotic nonaneurysmal thoracic aorta. Top-normal caliber main pulmonary artery (3.1 cm diameter). No central pulmonary emboli.  Mediastinum/Nodes: Mild multinodular goiter with dominant partially calcified 2.7 cm right thyroid nodule. Unremarkable esophagus. No pathologically enlarged axillary, mediastinal or hilar lymph nodes.  Lungs/Pleura: No pneumothorax. No pleural effusion. No acute consolidative airspace disease, lung masses or significant pulmonary nodules.  Musculoskeletal: No aggressive appearing focal osseous lesions. Moderate thoracic spondylosis.  CTA ABDOMEN AND PELVIS FINDINGS  Hepatobiliary: Normal liver size. Subcapsular hypervascular 1.1 cm left liver dome focus (series 1/image 221). No additional liver lesions. Normal gallbladder with no radiopaque cholelithiasis. No biliary ductal dilatation.  Pancreas:  Normal, with no mass or duct dilation. There is a 2.6 cm splenule along the posterior pancreatic tail with heterogeneous arterial phase enhancement equivalent to the spleen.  Spleen: Normal size. No mass.  Adrenals/Urinary Tract: Normal right adrenal. Left adrenal 1.5 cm nodule with density 50 HU. Indeterminate partially calcified hypodense 1.7 cm renal cortical lesion in the medial lower left kidney (series 12/image 371). Similar indeterminate partially calcified hypodense 1.2 cm renal cortical lesion in posterior interpolar left kidney (series 1/image 340). Subcentimeter hypodense renal cortical lesions scattered in the left kidney are too small to characterize and require no follow-up. Normal bladder.  Stomach/Bowel: Normal non-distended stomach. Normal caliber small bowel with no small bowel wall thickening. Normal appendix. Mild left colonic diverticulosis, no large bowel wall thickening or significant pericolonic fat stranding.  Vascular/Lymphatic: Atherosclerotic nonaneurysmal abdominal aorta. Patent renal and splenic veins. No pathologically enlarged lymph nodes in the abdomen or pelvis.  Reproductive: Normal size prostate.  Other: No pneumoperitoneum, ascites or focal fluid collection. Superficial cystic 1.9 cm lesion in the right back compatible with sebaceous cyst (series 1/image 410).  Musculoskeletal: No aggressive appearing focal osseous lesions. Fixation hardware partially visualized in the proximal left femur. Marked lumbar spondylosis.  VASCULAR MEASUREMENTS PERTINENT TO TAVR:  AORTA:  Minimal Aortic Diameter-15.5 x 14.9 mm  Severity of Aortic Calcification-mild  RIGHT PELVIS:  Right Common Iliac Artery -  Minimal Diameter-9.8 x 8.6 mm  Tortuosity-mild  Calcification-moderate  Right External Iliac Artery -  Minimal Diameter-9.7 x 8.9 mm  Tortuosity-mild  Calcification-mild  Right Common Femoral Artery -  Minimal  Diameter-9.3 x 6.9 mm  Tortuosity-mild  Calcification-mild  LEFT PELVIS:  Left Common Iliac Artery -  Minimal Diameter-9.0 x 8.9 mm  Tortuosity-moderate  Calcification-mild  Left External Iliac Artery -  Minimal Diameter-8.5 x 8.2 mm  Tortuosity-mild  Calcification-none  Left Common Femoral Artery -  Minimal Diameter-8.4 x 7.6 mm  Tortuosity-mild  Calcification-mild to moderate  Review of the MIP images confirms the above findings.  IMPRESSION: 1. Vascular findings and measurements pertinent to potential TAVR procedure, as detailed. 2. Diffuse thickening and coarse calcification of the aortic valve, compatible with the reported history of severe aortic stenosis. 3. Two vessel coronary atherosclerosis. 4. Mild multinodular goiter with dominant 2.7 cm partially calcified right thyroid nodule. Recommend thyroid US (ref: J Am Coll Radiol. 2015 Feb;12(2): 143-50). 5. Hypervascular 1.1 cm left liver dome focus. Indeterminate left adrenal nodule. Indeterminate calcified small left renal cortical lesions. Each of these findings can be further evaluated with MRI abdomen without and with  IV contrast. 6.  Aortic Atherosclerosis (ICD10-I70.0).   Electronically Signed   By: Delbert PhenixJason A Poff M.D.   On: 08/08/2019 15:34   Impression:  Patient has stage D severe symptomatic aortic stenosis and two-vessel coronary artery disease.  He describes a several year history of progressive symptoms of exertional shortness of breath and chest tightness consistent with chronic diastolic congestive heart failure, [place] Heart Association functional class II.  He may also be having stable angina pectoris.  I have personally reviewed the patient's recent transthoracic echocardiogram, diagnostic cardiac catheterization and CT angiograms.  Echocardiogram demonstrates the presence of severe aortic stenosis and normal left ventricular systolic function.  Images are suboptimal  but the patient appears to have a type I bicuspid aortic valve.  There is severe calcification with restricted leaflet mobility and severe aortic stenosis.  Peak velocity across aortic valve measured close to 4.5 m/s corresponding to mean transvalvular gradient well above 40 mmHg.  The patient also has mild to moderate aortic insufficiency.  Left ventricular systolic function appears normal.  There is left ventricular hypertrophy with some degree of diastolic dysfunction.    Diagnostic cardiac catheterization revealed severe two-vessel coronary artery disease with 100% chronic occlusion of the proximal right coronary artery and high-grade proximal stenosis of the left circumflex coronary artery.  There is nonobstructive disease in the left anterior descending coronary artery territory.  There are left to right collaterals filling the terminal branches of the right coronary artery.  Right heart pressures were normal.  Patient would best be treated with elective aortic valve replacement and coronary artery bypass grafting.    Plan:  The patient was counseled at length regarding treatment alternatives for management of severe aortic stenosis and coronary artery disease including continued medical therapy versus proceeding with aortic valve replacement and coronary artery bypass grafting in the near future.  Discussion was held comparing the relative risks of mechanical valve replacement with need for lifelong anticoagulation versus use of a bioprosthetic tissue valve and the associated potential for late structural valve deterioration and failure.  This discussion was placed in the context of the patient's particular circumstances, and as a result the patient specifically requests that their valve be replaced using a bioprosthetic tissue valve.  The patient understands and accepts all potential associated risks of surgery including but not limited to risk of death, stroke, myocardial infarction, congestive heart  failure, respiratory failure, renal failure, pneumonia, bleeding requiring blood transfusion and or reexploration, arrhythmia, heart block or bradycardia requiring permanent pacemaker, aortic dissection or other major vascular complication, pleural effusions or other delayed complications related to continued congestive heart failure, late recurrence of symptomatic ischemic heart disease, and other late complications related to valve replacement including structural valve deterioration and failure, thrombosis, endocarditis, or paravalvular leak.  We plan to proceed with surgery on August 24, 2019.  All questions answered.    I spent in excess of 15 minutes during the conduct of this office consultation and >50% of this time involved direct face-to-face encounter with the patient for counseling and/or coordination of their care.   Salvatore Decentlarence H. Cornelius Moraswen, MD 08/13/2019 12:32 PM

## 2019-08-13 NOTE — Patient Instructions (Signed)
   Continue taking all current medications without change through the day before surgery.  Make sure to bring all of your medications with you when you come for your Pre-Admission Testing appointment at [facility] Short-Stay Department.  Have nothing to eat or drink after midnight the night before surgery.  On the morning of surgery take only carvedilol and omeprazole with a sip of water.  At your appointment for Pre-Admission Testing at the [facility] Short-Stay Department you will be asked to sign permission forms for your upcoming surgery.  By definition your signature on these forms implies that you and/or your designee provide full informed consent for your planned surgical procedure(s), that alternative treatment options have been discussed, that you understand and accept any and all potential risks, and that you have some understanding of what to expect for your post-operative convalescence.  For any major cardiac surgical procedure potential operative risks include but are not limited to at least some risk of death, stroke or other neurologic complication, myocardial infarction, congestive heart failure, respiratory failure, renal failure, bleeding requiring blood transfusion and/or reexploration, irregular heart rhythm, heart block or bradycardia requiring permanent pacemaker, pneumonia, pericardial effusion, pleural effusion, wound infection, pulmonary embolus or other thromboembolic complication, chronic pain, or other complications related to the specific procedure(s) performed.  Please call to schedule a follow-up appointment in our office prior to surgery if you have any unresolved questions about your planned surgical procedure, the associated risks, alternative treatment options, and/or expectations for your post-operative recovery.

## 2019-08-21 ENCOUNTER — Encounter (HOSPITAL_COMMUNITY): Payer: Self-pay | Admitting: Thoracic Surgery (Cardiothoracic Vascular Surgery)

## 2019-08-21 ENCOUNTER — Ambulatory Visit: Payer: 59 | Admitting: Cardiology

## 2019-08-21 NOTE — Progress Notes (Signed)
[id number] - [place], [place] - [address] [place] [address] Phone: [phone] Fax: [phone]    Your procedure is scheduled on Friday, April 9th.  Report to [facility] Main Entrance "A" at 5:30 A.M., and check in at the Admitting office.  Call this number if you have problems the morning of surgery:  [phone]  Call [phone] if you have any questions prior to your surgery date Monday-Friday 8am-4pm   Remember:  Do not eat or drink after midnight the night before your surgery    Take these medicines the morning of surgery with A SIP OF WATER carvedilol (COREG)  omeprazole (PRILOSEC OTC)  rosuvastatin (CRESTOR)   As of today, STOP taking any Aspirin (unless otherwise instructed by your surgeon) and Aspirin containing products, Aleve, Naproxen, Ibuprofen, Motrin, Advil, Goody's, BC's, all herbal medications, fish oil, and all vitamins.             Do not wear jewelry, make up, or nail polish            Do not wear lotions, powders, perfumes,or deodorant.            Do not shave 48 hours prior to surgery.             Do not bring valuables to the hospital.            [facility] is not responsible for any belongings or valuables.  Do NOT Smoke (Tobacco/Vapping) or drink Alcohol 24 hours prior to your procedure If you use a CPAP at night, you may bring all equipment for your overnight stay.   Contacts, glasses, dentures or bridgework may not be worn into surgery.      For patients admitted to the hospital, discharge time will be determined by your treatment team.   Patients discharged the day of surgery will not be allowed to drive home, and someone needs to stay with them for 24 hours.  Special instructions:   [facility]- Preparing For Surgery  Before surgery, you can play an important role. Because skin is not sterile, your skin needs to be as free of germs as possible. You can reduce the number of germs on your skin by washing  with CHG (chlorahexidine gluconate) Soap before surgery.  CHG is an antiseptic cleaner which kills germs and bonds with the skin to continue killing germs even after washing.    Oral Hygiene is also important to reduce your risk of infection.  Remember - BRUSH YOUR TEETH THE MORNING OF SURGERY WITH YOUR REGULAR TOOTHPASTE  Please do not use if you have an allergy to CHG or antibacterial soaps. If your skin becomes reddened/irritated stop using the CHG.  Do not shave (including legs and underarms) for at least 48 hours prior to first CHG shower. It is OK to shave your face.  Please follow these instructions carefully.   1. Shower the NIGHT BEFORE SURGERY and the MORNING OF SURGERY with CHG Soap.   2. If you chose to wash your hair, wash your hair first as usual with your normal shampoo.  3. After you shampoo, rinse your hair and body thoroughly to remove the shampoo.  4. Use CHG as you would any other liquid soap. You can apply CHG directly to the skin and wash gently with a scrungie or a clean washcloth.   5. Apply the CHG Soap to your body ONLY FROM THE NECK DOWN.  Do not use on open wounds  or open sores. Avoid contact with your eyes, ears, mouth and genitals (private parts). Wash Face and genitals (private parts)  with your normal soap.   6. Wash thoroughly, paying special attention to the area where your surgery will be performed.  7. Thoroughly rinse your body with warm water from the neck down.  8. DO NOT shower/wash with your normal soap after using and rinsing off the CHG Soap.  9. Pat yourself dry with a CLEAN TOWEL.  10. Wear CLEAN PAJAMAS to bed the night before surgery, wear comfortable clothes the morning of surgery  11. Place CLEAN SHEETS on your bed the night of your first shower and DO NOT SLEEP WITH PETS.  Day of Surgery: Do not apply any deodorants/lotions.  Please wear clean clothes to the hospital/surgery center.   Remember to brush your teeth WITH YOUR REGULAR  TOOTHPASTE.   Please read over the following fact sheets that you were given.

## 2019-08-21 NOTE — H&P (Signed)
[address]       [name] [address]             [phone]          CARDIOTHORACIC SURGERY HISTORY AND PHYSICAL EXAM  Primary Cardiologist is Norman Herrlich, MD PCP is Baldo Ash, FNP  Chief Complaint  Patient presents with  . Aortic Stenosis    Surgical eval, ECHO 07/06/19    HPI:  Patient is 54 year old morbidly obese white male with history of hypertension who has been referred for surgical consultation to discuss treatment options for management of severe symptomatic aortic stenosis.  Patient states that he was first noted to have a heart murmur several years ago by his primary care physician.  He was referred for cardiology consultation and evaluated by Dr. Dulce Sellar November 2019.  Transthoracic echocardiogram was ordered but never performed.  Patient states that he did not go through with echocardiogram because of the high associated out-of-pocket expense.  The patient was recently seen in follow-up by Dr. Dulce Sellar and transthoracic echocardiogram eventually performed June 26, 2019.  Echocardiogram revealed normal left ventricular systolic function with likely bicuspid aortic valve and severe aortic stenosis.  Cardiothoracic surgical consultation was requested.  Patient is married and lives locally in [place] with his wife and one of his adult children.  He works full-time as a Chartered certified accountant.  He lives a somewhat sedentary lifestyle but reports no significant physical limitation other than that of exertional shortness of breath.  He states that he has had symptoms of exertional shortness of breath and chest discomfort for at least 3 or 4 years.  Symptoms have continued to gradually progress.  He now gets short of breath with moderate level activity.  Symptoms are sometimes associated with tightness across the chest.  Symptoms are always relieved by rest.  He denies nocturnal chest pain or shortness of breath.  He reports occasional mild dizzy spells with no  history of syncope.  He has had some mild lower extremity edema.  He denies history of PND, orthopnea, or lower extremity edema.  Patient was originally seen in consultation on July 23, 2019.  Since then he underwent diagnostic cardiac catheterization by Dr. Dulce Sellar on July 31, 2019.  Catheterization confirmed the presence of at least moderate aortic stenosis and also revealed severe two-vessel coronary artery disease with 100% chronic occlusion of the right coronary artery and a high-grade stenosis of proximal left circumflex coronary artery.  Right heart pressures were normal.  CT angiography was performed on the patient returns to our office for follow-up today.  He reports no new problems or complaints although he continues to experience symptoms of exertional chest pain and shortness of breath consistent with both chronic diastolic congestive heart failure and likely stable angina pectoris.  Past Medical History:  Diagnosis Date  . Aortic stenosis   . BMI 40.0-44.9, adult (HCC)   . Coronary artery disease   . Hypertension   . Knee pain 03/15/2018  . Swelling of lower leg     Past Surgical History:  Procedure Laterality Date  . FEMUR SURGERY    . RIGHT/LEFT HEART CATH AND CORONARY ANGIOGRAPHY N/A 07/31/2019   Procedure: RIGHT/LEFT HEART CATH AND CORONARY ANGIOGRAPHY;  Surgeon: Tonny Bollman, MD;  Location: [facility] INVASIVE CV LAB;  Service: Cardiovascular;  Laterality: N/A;  . WRIST SURGERY      Family History  Problem Relation Age of Onset  . CAD Mother   . Deep vein thrombosis Mother   .  Heart murmur Mother   . Diabetes Mother   . Diabetes Cousin     Social History Social History   Tobacco Use  . Smoking status: Current Every Day Smoker    Packs/day: 1.00    Years: 28.00    Pack years: 28.00    Types: Cigarettes  . Smokeless tobacco: Never Used  Substance Use Topics  . Alcohol use: Yes    Comment: occ beer   . Drug use: Not Currently    Prior to Admission medications    Medication Sig Start Date End Date Taking? Authorizing Provider  amoxicillin (AMOXIL) 500 MG capsule Take 4 capsules (2,000 mg) 45 minutes before dental procedures. 03/23/18   Baldo Daub, MD  Boswellia-Glucosamine-Vit D (OSTEO BI-FLEX ONE PER DAY PO) Take 1 tablet by mouth daily.     [provider]  Calcium Carbonate-Vitamin D (CALCIUM 600/VITAMIN D PO) Take 1 tablet by mouth daily.    [provider]  carvedilol (COREG) 25 MG tablet Take 1 tablet (25 mg total) by mouth 2 (two) times daily. 06/22/19   Baldo Daub, MD  hydrochlorothiazide (HYDRODIURIL) 25 MG tablet Take 1 tablet (25 mg total) by mouth daily. 04/30/19   Baldo Daub, MD  lisinopril (ZESTRIL) 20 MG tablet Take 1 tablet (20 mg total) by mouth daily. 04/30/19   Baldo Daub, MD  omeprazole (PRILOSEC OTC) 20 MG tablet Take 20 mg by mouth daily.    [provider]  OVER THE COUNTER MEDICATION Take 1 tablet by mouth daily. Allergy Medicine    [provider]  rosuvastatin (CRESTOR) 5 MG tablet Take 1 tablet (5 mg total) by mouth daily. 07/12/19 10/10/19  Baldo Daub, MD  spironolactone (ALDACTONE) 25 MG tablet Take 1 tablet (25 mg total) by mouth daily. 04/30/19   Baldo Daub, MD    Allergies  Allergen Reactions  . Coricidin Hbp Cough-Cold [Chlorpheniramine-Dm] Hives     Review of Systems:              General:                      normal appetite, decreased energy, no weight gain, no weight loss, no fever             Cardiac:                       + chest pain with exertion, no chest pain at rest, + SOB with exertion, no resting SOB, no PND, no orthopnea, no palpitations, no arrhythmia, no atrial fibrillation, + LE edema, mild dizzy spells, no syncope             Respiratory:                 + shortness of breath, no home oxygen, no productive cough, + dry cough, no bronchitis, no wheezing, no hemoptysis, no asthma, no pain with inspiration or cough, no sleep apnea, no CPAP  at night             GI:                               no difficulty swallowing, + reflux, no frequent heartburn, no hiatal hernia, no abdominal pain, occasional constipation, no diarrhea, no hematochezia, no hematemesis, no melena             GU:  no dysuria,  no frequency, no urinary tract infection, no hematuria, no enlarged prostate, no kidney stones, no kidney disease             Vascular:                     no pain suggestive of claudication, no pain in feet, + leg cramps, no varicose veins, no DVT, no non-healing foot ulcer             Neuro:                         no stroke, no TIA's, no seizures, + headaches, no temporary blindness one eye,  no slurred speech, no peripheral neuropathy, no chronic pain, no instability of gait, no memory/cognitive dysfunction             Musculoskeletal:         no arthritis, no joint swelling, no myalgias, no difficulty walking, normal mobility              Skin:                            no rash, no itching, no skin infections, no pressure sores or ulcerations             Psych:                         no anxiety, no depression, no nervousness, no unusual recent stress             Eyes:                           no blurry vision, + floaters, no recent vision changes, + wears glasses for reading only             ENT:                            no hearing loss, no loose or painful teeth, no dentures, last saw dentist within 6 months             Hematologic:               no easy bruising, no abnormal bleeding, no clotting disorder, no frequent epistaxis             Endocrine:                   no diabetes, does not check CBG's at home                                                       Physical Exam:              BP 128/82   Pulse 73   Temp 98.1 F (36.7 C) (Skin)   Resp 20   Ht 6\' 1"  (1.854 m)   Wt (!) 325 lb (147.4 kg)   SpO2 96% Comment: RA  BMI 42.88 kg/m              General:  Obese,   well-appearing             HEENT:                       Unremarkable              Neck:                           no JVD, no bruits, no adenopathy              Chest:                          clear to auscultation, symmetrical breath sounds, no wheezes, no rhonchi              CV:                              RRR, grade III/VI crescendo/decrescendo murmur heard best at RSB,  no diastolic murmur             Abdomen:                    soft, non-tender, no masses              Extremities:                 warm, well-perfused, pulses diminished but, trace LE edema             Rectal/GU                   Deferred             Neuro:                         Grossly non-focal and symmetrical throughout             Skin:                            Clean and dry, no rashes, no breakdown   Diagnostic Tests:   ECHOCARDIOGRAM REPORT       Patient Name:  MACARI ZALESKY Henegar Date of Exam: 06/26/2019  Medical Rec #: [id number]      Height:    73.0 in  Accession #:  [id number]     Weight:    319.0 lb  Date of Birth: 1965/05/28      BSA:     2.62 m  Patient Age:  53 years      BP:      162/86 mmHg  Patient Gender: M          HR:      65 bpm.  Exam Location: [facility]   Procedure: 2D Echo   Indications:  Nonrheumatic aortic valve stenosis [I35.0 (ICD-10-CM)];  Swelling         of lower leg [M79.89 (ICD-10-CM)]    History:    Patient has no prior history of Echocardiogram  examinations.         Signs/Symptoms:Murmur; Risk Factors:Hypertension.    Sonographer:  Louie Boston  Referring Phys: [phone] BRIAN J MUNLEY   IMPRESSIONS    1. Left ventricular ejection fraction, by estimation, is 65 to 70%. The  left ventricle has normal function. The left ventrical  has no regional  wall motion abnormalities. There is moderately increased left ventricular  hypertrophy. Left ventricular  diastolic parameters are  consistent with Grade II diastolic dysfunction  (pseudonormalization).  2. Left atrial size was mildly dilated.  3. Trivial mitral valve regurgitation.  4. The aortic valve is hickened, moderate capcification noted, restricted  mobility. Can not r/o bicuspid valve. Aortic valve regurgitation is mild  to moderate. Severe aortic valve stenosis. Peak/mean gradient : 80/43  mmHg, AVA 0.85 cm2, DI 0.27.  5. The inferior vena cava is normal in size with greater than 50%  respiratory variability, suggesting right atrial pressure of 3 mmHg.   FINDINGS  Left Ventricle: Left ventricular ejection fraction, by estimation, is 65  to 70%. The left ventricle has normal function. The left ventricle has no  regional wall motion abnormalities. The left ventricular internal cavity  size was normal in size. There is  moderately increased left ventricular hypertrophy. Left ventricular  diastolic parameters are consistent with Grade II diastolic dysfunction  (pseudonormalization).   Right Ventricle: The right ventricular size is normal. No increase in  right ventricular wall thickness. Right ventricular systolic function is  normal. There is normal pulmonary artery systolic pressure. The tricuspid  regurgitant velocity is 2.42 m/s, and  with an assumed right atrial pressure of 3 mmHg, the estimated right  ventricular systolic pressure is 26.4 mmHg.   Left Atrium: Left atrial size was mildly dilated.   Right Atrium: Right atrial size was normal in size.   Pericardium: There is no evidence of pericardial effusion.   Mitral Valve: The mitral valve is normal in structure and function. Normal  mobility of the mitral valve leaflets. Trivial mitral valve regurgitation.  No evidence of mitral valve stenosis. MV peak gradient, 5.3 mmHg. The mean  mitral valve gradient is 3.0  mmHg.   Tricuspid Valve: The tricuspid valve is normal in structure. Tricuspid  valve regurgitation is not demonstrated. No  evidence of tricuspid  stenosis.   Aortic Valve: The aortic valve is normal in structure and function.. There  is moderate thickening and moderate calcification of the aortic valve.  Aortic valve regurgitation is mild to moderate. Aortic regurgitation PHT  measures 671 msec. Severe aortic  stenosis is present. There is moderate thickening of the aortic valve.  There is moderate calcification of the aortic valve. Aortic valve mean  gradient measures 43.0 mmHg. Aortic valve peak gradient measures 80.3  mmHg. Aortic valve area, by VTI measures  0.85 cm.   Pulmonic Valve: The pulmonic valve was normal in structure. Pulmonic valve  regurgitation is not visualized. No evidence of pulmonic stenosis.   Aorta: The aortic root is normal in size and structure.   Venous: The inferior vena cava is normal in size with greater than 50%  respiratory variability, suggesting right atrial pressure of 3 mmHg. The  inferior vena cava and the hepatic vein show a normal flow pattern.   IAS/Shunts: No atrial level shunt detected by color flow Doppler.     LEFT VENTRICLE  PLAX 2D  LVIDd:     5.10 cm Diastology  LVIDs:     3.30 cm LV e' lateral:  6.42 cm/s  LV PW:     1.80 cm LV E/e' lateral: 20.1  LV IVS:    1.70 cm LV e' medial:  5.22 cm/s  LVOT diam:   2.00 cm LV E/e' medial: 24.7  LV SV:     98.02 ml  LV SV Index:  28.53  LVOT  Area:   3.14 cm     RIGHT VENTRICLE     IVC  TAPSE (M-mode): 3.1 cm IVC diam: 1.70 cm   LEFT ATRIUM       Index    RIGHT ATRIUM      Index  LA diam:    4.90 cm 1.87 cm/m RA Area:   15.30 cm  LA Vol (A2C):  107.0 ml 40.78 ml/m RA Volume:  38.80 ml 14.79 ml/m  LA Vol (A4C):  90.8 ml 34.61 ml/m  LA Biplane Vol: 100.0 ml 38.11 ml/m  AORTIC VALVE  AV Area (Vmax):  0.83 cm  AV Area (Vmean):  0.80 cm  AV Area (VTI):   0.85 cm  AV Vmax:      448.00 cm/s  AV Vmean:     308.000  cm/s  AV VTI:      1.150 m  AV Peak Grad:   80.3 mmHg  AV Mean Grad:   43.0 mmHg  LVOT Vmax:     119.00 cm/s  LVOT Vmean:    78.100 cm/s  LVOT VTI:     0.312 m  LVOT/AV VTI ratio: 0.27  AI PHT:      671 msec    AORTA  Ao Root diam: 3.50 cm   MITRAL VALVE             TRICUSPID VALVE  MV Area (PHT): 2.51 cm       TR Peak grad:  23.4 mmHg  MV Peak grad: 5.3 mmHg       TR Vmax:    242.00 cm/s  MV Mean grad: 3.0 mmHg  MV Vmax:    1.15 m/s       SHUNTS  MV Vmean:   75.3 cm/s       Systemic VTI: 0.31 m  MV Decel Time: 303 msec       Systemic Diam: 2.00 cm  MV E velocity: 129.00 cm/s 103 cm/s  MV A velocity: 80.80 cm/s 70.3 cm/s  MV E/A ratio: 1.60    1.5   Gypsy Balsam MD  Electronically signed by Gypsy Balsam MD  Signature Date/Time: 06/26/2019/12:26:31 PM   RIGHT/LEFT HEART CATH AND CORONARY ANGIOGRAPHY  Conclusion  1.  Two-vessel coronary artery disease with total occlusion of the right coronary artery, the PDA and PLA branches fill late from right to right collaterals and faint left-to-right collaterals.  Moderate stenosis of the proximal circumflex beyond the intermediate branch origin 2.  Widely patent left main, LAD, and ramus intermedius with only mild nonobstructive disease 3.  Moderate aortic stenosis with mean gradient 32 mmHg and calculated aortic valve area 1.49 sq cm 4.  Essentially normal right heart hemodynamics with preserved cardiac output  Recommendations: Continued surgical evaluation for AVR/CABG in this symptomatic patient with severe aortic stenosis by echo criteria  Indications  Severe aortic stenosis [I35.0 (ICD-10-CM)]  Procedural Details  Technical Details INDICATION: Severe symptomatic aortic stenosis (preoperative cath study)  PROCEDURAL DETAILS: There was an indwelling IV in a right antecubital vein. Using normal sterile technique, the IV was  changed out for a 5 Fr brachial sheath over a 0.018 inch wire. The right wrist was then prepped, draped, and anesthetized with 1% lidocaine. Using the modified Seldinger technique a 5/6 French Slender sheath was placed in the right radial artery. Intra-arterial verapamil was administered through the radial artery sheath. IV heparin was administered after a JR4 catheter was advanced into the central aorta. A Swan-Ganz catheter was used for the right  heart catheterization. Standard protocol was followed for recording of right heart pressures and sampling of oxygen saturations. Fick cardiac output was calculated. Standard Judkins catheters were used for selective coronary angiography. LV pressure is recorded and an aortic valve pullback is performed.  The aortic valve is crossed with an AL-1 catheter and straight tip wire.  There were no immediate procedural complications. The patient was transferred to the post catheterization recovery area for further monitoring.    Estimated blood loss <50 mL.   During this procedure medications were administered to achieve and maintain moderate conscious sedation while the patient's heart rate, blood pressure, and oxygen saturation were continuously monitored and I was present face-to-face 100% of this time.  Medications (Filter: Administrations occurring from 07/31/19 0715 to 07/31/19 0847) Heparin (Porcine) in NaCl 1000-0.9 UT/500ML-% SOLN (mL) Total volume:  1,000 mL Date/Time  Rate/Dose/Volume Action  07/31/19 0738  500 mL Given  0738  500 mL Given    midazolam (VERSED) injection (mg) Total dose:  2 mg Date/Time  Rate/Dose/Volume Action  07/31/19 0742  1 mg Given  0746  1 mg Given    fentaNYL (SUBLIMAZE) injection (mcg) Total dose:  25 mcg Date/Time  Rate/Dose/Volume Action  07/31/19 0743  25 mcg Given    Canceled Entry    lidocaine (PF) (XYLOCAINE) 1 % injection (mL) Total volume:  5 mL Date/Time  Rate/Dose/Volume Action  07/31/19 0759  2 mL Given   0802  3 mL Given    Radial Cocktail/Verapamil only (mL) Total volume:  10 mL Date/Time  Rate/Dose/Volume Action  07/31/19 0800  10 mL Given    heparin injection (Units) Total dose:  6,000 Units Date/Time  Rate/Dose/Volume Action  07/31/19 0815  6,000 Units Given    iohexol (OMNIPAQUE) 350 MG/ML injection (mL) Total volume:  65 mL Date/Time  Rate/Dose/Volume Action  07/31/19 0837  65 mL Given    Sedation Time  Sedation Time Physician-1: 57 minutes 34 seconds  Contrast  Medication Name Total Dose  iohexol (OMNIPAQUE) 350 MG/ML injection 65 mL    Radiation/Fluoro  Fluoro time: 9.6 (min) DAP: 61207 (mGycm2) Cumulative Air Kerma: 969 (mGy)  Coronary Findings  Diagnostic Dominance: Right Left Anterior Descending  There is mild diffuse disease throughout the vessel. Large vessel, wraps around the LV apex, mild diffuse irregularity with no significant stenosis throughout. The 1st diagonal branch is relatively small in caliber with no significant stenosis present.  Ramus Intermedius  Ramus lesion 30% stenosed  Ramus lesion is 30% stenosed.  Left Circumflex  Ost Cx to Prox Cx lesion 60% stenosed  Ost Cx to Prox Cx lesion is 60% stenosed. There is moderate stenosis at the ostium of the circumflex just beyond the intermediate origin. This vessel supplies a single posterolateral branch of significance and a 2nd very small PL branch.  Right Coronary Artery  Prox RCA to Mid RCA lesion 100% stenosed  Prox RCA to Mid RCA lesion is 100% stenosed. Dominant vessel, total occlusion in the midportion at the origin of the 1st RV marginal branch. The distal RCA and branch vessels are filled late by right to right and left to right collaterals. The PDA and PL branches are not well visualized via either collateral system.  Right Posterior Descending Artery  Collaterals  RPDA filled by collaterals from RV Branch.    Second Right Posterolateral Branch  Collaterals  2nd RPL filled by  collaterals from RV Branch.    Third Right Posterolateral Branch  Collaterals  3rd RPL filled by  collaterals from 2nd Sept.    Intervention  No interventions have been documented. Left Heart  Aortic Valve There is moderate aortic valve stenosis. There is restricted aortic valve motion. Mean transvalvular gradient 32 mmHg, calculated aortic valve area 1.49 sq cm  Coronary Diagrams  Diagnostic Dominance: Right  Intervention  Implants   No implant documentation for this case.  Syngo Images  Show images for CARDIAC CATHETERIZATION  Images on Long Term Storage  Show images for Kobler, Keyante Darrell "Darrell"   Link to Procedure Log  Procedure Log    Hemo Data   Most Recent Value  Fick Cardiac Output 6.67 L/min  Fick Cardiac Output Index 2.52 (L/min)/BSA  Aortic Mean Gradient 31.7 mmHg  Aortic Peak Gradient 33 mmHg  Aortic Valve Area 1.49  Aortic Value Area Index 0.57 cm2/BSA  RA A Wave 7 mmHg  RA V Wave 6 mmHg  RA Mean 6 mmHg  RV Systolic Pressure 34 mmHg  RV Diastolic Pressure 5 mmHg  RV EDP 9 mmHg  PA Systolic Pressure 34 mmHg  PA Diastolic Pressure 15 mmHg  PA Mean 22 mmHg  PW A Wave 23 mmHg  PW V Wave 24 mmHg  PW Mean 18 mmHg  AO Systolic Pressure 130 mmHg  AO Diastolic Pressure 71 mmHg  AO Mean 95 mmHg  LV Systolic Pressure 162 mmHg  LV Diastolic Pressure 6 mmHg  LV EDP 18 mmHg  AOp Systolic Pressure 133 mmHg  AOp Diastolic Pressure 64 mmHg  AOp Mean Pressure 91 mmHg  LVp Systolic Pressure 166 mmHg  LVp Diastolic Pressure 7 mmHg  LVp EDP Pressure 18 mmHg  QP/QS 0.93  TPVR Index 8.71 HRUI  TSVR Index 37.63 HRUI  PVR SVR Ratio 0.04  TPVR/TSVR Ratio 0.      Cardiac TAVR CT  TECHNIQUE: The patient was scanned on a Siemens Force 192 slice scanner. A 120 kV retrospective scan was triggered in the descending thoracic aorta at 111 HU's. Gantry rotation speed was 270 msecs and collimation was .9 mm. No beta blockade or nitro were given. The 3D  data set was reconstructed in 5% intervals of the R-R cycle. Systolic and diastolic phases were analyzed on a dedicated work station using MPR, MIP and VRT modes. The patient received OMNIPAQUE IOHEXOL 350 MG/ML SOLN of contrast.  FINDINGS: Aortic Valve: Trileaflet aortic valve. Moderately reduced cusp separation. Severely thickened, severely calcified aortic valve cusps.  AV calcium score: 2305  Virtual Basal Annulus Measurements:  Maximum/Minimum Diameter: 30.7 x 25.8 mm  Perimeter: 86.9 mm  Area: 577 mm  LVOT calcification posteriorly.  Based on these measurements, the annulus would be suitable for a 29 mm Sapien 3 valve.  Sinus of Valsalva Measurements:  Non-coronary: 34 mm  Right - coronary: 33 mm  Left - coronary: 36 mm  Sinus of Valsalva Height:  Left: 21.7 mm  Right: 19.5 mm  Aorta: Conventional 3 vessel branch pattern of the aortic arch. No significant aorta calcifications.  Sinotubular Junction: 30 mm  Ascending Thoracic Aorta: 36 mm  Aortic Arch: 26 mm  Descending Thoracic Aorta: 23 mm  Coronary Artery Height above Annulus:  Left Main: 15.2 mm  Right Coronary: 16 mm  Coronary Arteries: 3 vessel coronary artery calcifications.  Optimum Fluoroscopic Angle for Delivery: RAO 1, CRA 1  No left atrial appendage thrombus.  IMPRESSION: 1. Trileaflet aortic valve. Moderately reduced cusp separation. Severely thickened, severely calcified aortic valve cusps.  2. AV calcium score: 2305  3. Annulus area 577  mm, suitable for a 29 mm Sapien 3 valve. Posterior LVOT calcification.  4. Normal caliber thoracic aorta, no calcifications.  5. Adequate coronary heights from the annulus.  6. Optimum Fluoroscopic Angle for Delivery: RAO 1, CRA 1   Electronically Signed By: Weston Brass On: 08/12/2019 20:05       CT ANGIOGRAPHY CHEST, ABDOMEN AND PELVIS  TECHNIQUE: Multidetector  CT imaging through the chest, abdomen and pelvis was performed using the standard protocol during bolus administration of intravenous contrast. Multiplanar reconstructed images and MIPs were obtained and reviewed to evaluate the vascular anatomy.  CONTRAST: OMNIPAQUE IOHEXOL 350 MG/ML SOLN  COMPARISON: None.  FINDINGS: CTA CHEST FINDINGS  Cardiovascular: Top-normal heart size. Diffuse thickening and coarse calcification of the aortic valve. No significant pericardial effusion/thickening. Left anterior descending and left circumflex coronary atherosclerosis. Atherosclerotic nonaneurysmal thoracic aorta. Top-normal caliber main pulmonary artery (3.1 cm diameter). No central pulmonary emboli.  Mediastinum/Nodes: Mild multinodular goiter with dominant partially calcified 2.7 cm right thyroid nodule. Unremarkable esophagus. No pathologically enlarged axillary, mediastinal or hilar lymph nodes.  Lungs/Pleura: No pneumothorax. No pleural effusion. No acute consolidative airspace disease, lung masses or significant pulmonary nodules.  Musculoskeletal: No aggressive appearing focal osseous lesions. Moderate thoracic spondylosis.  CTA ABDOMEN AND PELVIS FINDINGS  Hepatobiliary: Normal liver size. Subcapsular hypervascular 1.1 cm left liver dome focus (series 1/image 221). No additional liver lesions. Normal gallbladder with no radiopaque cholelithiasis. No biliary ductal dilatation.  Pancreas: Normal, with no mass or duct dilation. There is a 2.6 cm splenule along the posterior pancreatic tail with heterogeneous arterial phase enhancement equivalent to the spleen.  Spleen: Normal size. No mass.  Adrenals/Urinary Tract: Normal right adrenal. Left adrenal 1.5 cm nodule with density 50 HU. Indeterminate partially calcified hypodense 1.7 cm renal cortical lesion in the medial lower left kidney (series 12/image 371). Similar indeterminate partially calcified  hypodense 1.2 cm renal cortical lesion in posterior interpolar left kidney (series 1/image 340). Subcentimeter hypodense renal cortical lesions scattered in the left kidney are too small to characterize and require no follow-up. Normal bladder.  Stomach/Bowel: Normal non-distended stomach. Normal caliber small bowel with no small bowel wall thickening. Normal appendix. Mild left colonic diverticulosis, no large bowel wall thickening or significant pericolonic fat stranding.  Vascular/Lymphatic: Atherosclerotic nonaneurysmal abdominal aorta. Patent renal and splenic veins. No pathologically enlarged lymph nodes in the abdomen or pelvis.  Reproductive: Normal size prostate.  Other: No pneumoperitoneum, ascites or focal fluid collection. Superficial cystic 1.9 cm lesion in the right back compatible with sebaceous cyst (series 1/image 410).  Musculoskeletal: No aggressive appearing focal osseous lesions. Fixation hardware partially visualized in the proximal left femur. Marked lumbar spondylosis.  VASCULAR MEASUREMENTS PERTINENT TO TAVR:  AORTA:  Minimal Aortic Diameter-15.5 x 14.9 mm  Severity of Aortic Calcification-mild  RIGHT PELVIS:  Right Common Iliac Artery -  Minimal Diameter-9.8 x 8.6 mm  Tortuosity-mild  Calcification-moderate  Right External Iliac Artery -  Minimal Diameter-9.7 x 8.9 mm  Tortuosity-mild  Calcification-mild  Right Common Femoral Artery -  Minimal Diameter-9.3 x 6.9 mm  Tortuosity-mild  Calcification-mild  LEFT PELVIS:  Left Common Iliac Artery -  Minimal Diameter-9.0 x 8.9 mm  Tortuosity-moderate  Calcification-mild  Left External Iliac Artery -  Minimal Diameter-8.5 x 8.2 mm  Tortuosity-mild  Calcification-none  Left Common Femoral Artery -  Minimal Diameter-8.4 x 7.6 mm  Tortuosity-mild  Calcification-mild to moderate  Review of the MIP images confirms the above  findings.  IMPRESSION: 1. Vascular findings and measurements pertinent  to potential TAVR procedure, as detailed. 2. Diffuse thickening and coarse calcification of the aortic valve, compatible with the reported history of severe aortic stenosis. 3. Two vessel coronary atherosclerosis. 4. Mild multinodular goiter with dominant 2.7 cm partially calcified right thyroid nodule. Recommend thyroid US (ref: J Am Coll Radiol. 2015 Feb;12(2): 143-50). 5. Hypervascular 1.1 cm left liver dome focus. Indeterminate left adrenal nodule. Indeterminate calcified small left renal cortical lesions. Each of these findings can be further evaluated with MRI abdomen without and with IV contrast. 6. Aortic Atherosclerosis (ICD10-I70.0).   Electronically Signed By: Ilona Sorrel M.D. On: 08/08/2019 15:34  Impression:  Patient has stage D severe symptomatic aortic stenosis and two-vessel coronary artery disease. He describes a several year history of progressive symptoms of exertional shortness of breath and chest tightness consistent with chronic diastolic congestive heart failure, New York Heart Association functional class II. He may also be having stable angina pectoris. I have personally reviewed the patient's recent transthoracic echocardiogram, diagnostic cardiac catheterization and CT angiograms.  Echocardiogram demonstrates the presence of severe aortic stenosis and normal left ventricular systolic function. Images are suboptimal but the patient appears to have a type I bicuspid aortic valve. There is severe calcification with restricted leaflet mobility and severe aortic stenosis. Peak velocity across aortic valve measured close to 4.5 m/s corresponding to mean transvalvular gradient well above 40 mmHg. The patient also has mild to moderate aortic insufficiency. Left ventricular systolic function appears normal. There is left ventricular hypertrophy with some degree of diastolic dysfunction.    Diagnostic cardiac catheterization revealed severe two-vessel coronary artery disease with 100% chronic occlusion of the proximal right coronary artery and high-grade proximal stenosis of the left circumflex coronary artery.  There is nonobstructive disease in the left anterior descending coronary artery territory.  There are left to right collaterals filling the terminal branches of the right coronary artery.  Right heart pressures were normal.  Patient would best be treated with elective aortic valve replacement and coronary artery bypass grafting.    Plan:  The patient was counseled at length regarding treatment alternatives for management of severe aortic stenosis and coronary artery disease including continued medical therapy versus proceeding with aortic valve replacement and coronary artery bypass grafting in the near future.  Discussion was held comparing the relative risks of mechanical valve replacement with need for lifelong anticoagulation versus use of a bioprosthetic tissue valve and the associated potential for late structural valve deterioration and failure.  This discussion was placed in the context of the patient's particular circumstances, and as a result the patient specifically requests that their valve be replaced using a bioprosthetic tissue valve.  The patient understands and accepts all potential associated risks of surgery including but not limited to risk of death, stroke, myocardial infarction, congestive heart failure, respiratory failure, renal failure, pneumonia, bleeding requiring blood transfusion and or reexploration, arrhythmia, heart block or bradycardia requiring permanent pacemaker, aortic dissection or other major vascular complication, pleural effusions or other delayed complications related to continued congestive heart failure, late recurrence of symptomatic ischemic heart disease, and other late complications related to valve replacement including structural  valve deterioration and failure, thrombosis, endocarditis, or paravalvular leak.  We plan to proceed with surgery on August 24, 2019.  All questions answered.      Valentina Gu. Roxy Manns, MD 08/13/2019 12:32 PM

## 2019-08-22 ENCOUNTER — Encounter (HOSPITAL_COMMUNITY)
Admission: RE | Admit: 2019-08-22 | Discharge: 2019-08-22 | Disposition: A | Discharge status: Home / Self Care | Payer: 59 | Source: Ambulatory Visit | Attending: Thoracic Surgery (Cardiothoracic Vascular Surgery) | Admitting: Thoracic Surgery (Cardiothoracic Vascular Surgery)

## 2019-08-22 ENCOUNTER — Encounter (HOSPITAL_COMMUNITY): Payer: Self-pay

## 2019-08-22 ENCOUNTER — Other Ambulatory Visit: Payer: Self-pay

## 2019-08-22 ENCOUNTER — Ambulatory Visit (HOSPITAL_BASED_OUTPATIENT_CLINIC_OR_DEPARTMENT_OTHER)
Admission: RE | Admit: 2019-08-22 | Discharge: 2019-08-22 | Disposition: A | Discharge status: Home / Self Care | Payer: 59 | Source: Ambulatory Visit | Attending: Thoracic Surgery (Cardiothoracic Vascular Surgery) | Admitting: Thoracic Surgery (Cardiothoracic Vascular Surgery)

## 2019-08-22 ENCOUNTER — Ambulatory Visit (HOSPITAL_COMMUNITY)
Admission: RE | Admit: 2019-08-22 | Discharge: 2019-08-22 | Disposition: A | Discharge status: Home / Self Care | Payer: 59 | Source: Ambulatory Visit | Attending: Thoracic Surgery (Cardiothoracic Vascular Surgery) | Admitting: Thoracic Surgery (Cardiothoracic Vascular Surgery)

## 2019-08-22 ENCOUNTER — Other Ambulatory Visit (HOSPITAL_COMMUNITY)
Admission: RE | Admit: 2019-08-22 | Discharge: 2019-08-22 | Disposition: A | Discharge status: Home / Self Care | Payer: 59 | Source: Ambulatory Visit | Attending: Thoracic Surgery (Cardiothoracic Vascular Surgery) | Admitting: Thoracic Surgery (Cardiothoracic Vascular Surgery)

## 2019-08-22 DIAGNOSIS — I350 Nonrheumatic aortic (valve) stenosis: Secondary | ICD-10-CM | POA: Diagnosis not present

## 2019-08-22 DIAGNOSIS — I2510 Atherosclerotic heart disease of native coronary artery without angina pectoris: Secondary | ICD-10-CM

## 2019-08-22 DIAGNOSIS — I6523 Occlusion and stenosis of bilateral carotid arteries: Secondary | ICD-10-CM | POA: Insufficient documentation

## 2019-08-22 DIAGNOSIS — Z01818 Encounter for other preprocedural examination: Secondary | ICD-10-CM | POA: Insufficient documentation

## 2019-08-22 DIAGNOSIS — Z20822 Contact with and (suspected) exposure to covid-19: Secondary | ICD-10-CM | POA: Insufficient documentation

## 2019-08-22 HISTORY — DX: Angina pectoris, unspecified: I20.9

## 2019-08-22 HISTORY — DX: Cardiac murmur, unspecified: R01.1

## 2019-08-22 LAB — BLOOD GAS, ARTERIAL
Acid-base deficit: 0.2 mmol/L (ref 0.0–2.0)
Bicarbonate: 23.6 mmol/L (ref 20.0–28.0)
Drawn by: 421801
FIO2: 21
O2 Saturation: 98.3 %
Patient temperature: 37
pCO2 arterial: 36.1 mmHg (ref 32.0–48.0)
pH, Arterial: 7.43 (ref 7.350–7.450)
pO2, Arterial: 117 mmHg — ABNORMAL HIGH (ref 83.0–108.0)

## 2019-08-22 LAB — COMPREHENSIVE METABOLIC PANEL
ALT: 21 U/L (ref 0–44)
AST: 20 U/L (ref 15–41)
Albumin: 3.8 g/dL (ref 3.5–5.0)
Alkaline Phosphatase: 48 U/L (ref 38–126)
Anion gap: 10 (ref 5–15)
BUN: 13 mg/dL (ref 6–20)
CO2: 21 mmol/L — ABNORMAL LOW (ref 22–32)
Calcium: 8.9 mg/dL (ref 8.9–10.3)
Chloride: 100 mmol/L (ref 98–111)
Creatinine, Ser: 0.98 mg/dL (ref 0.61–1.24)
GFR calc Af Amer: >60 mL/min (ref >60)
GFR calc non Af Amer: >60 mL/min (ref >60)
Glucose, Bld: 135 mg/dL — ABNORMAL HIGH (ref 70–99)
Potassium: 4.3 mmol/L (ref 3.5–5.1)
Sodium: 131 mmol/L — ABNORMAL LOW (ref 135–145)
Total Bilirubin: 0.9 mg/dL (ref 0.3–1.2)
Total Protein: 7.3 g/dL (ref 6.5–8.1)

## 2019-08-22 LAB — URINALYSIS, ROUTINE W REFLEX MICROSCOPIC
Bilirubin Urine: NEGATIVE
Glucose, UA: NEGATIVE mg/dL
Hgb urine dipstick: NEGATIVE
Ketones, ur: NEGATIVE mg/dL
Leukocytes,Ua: NEGATIVE
Nitrite: NEGATIVE
Protein, ur: NEGATIVE mg/dL
Specific Gravity, Urine: 1.005 (ref 1.005–1.030)
pH: 6 (ref 5.0–8.0)

## 2019-08-22 LAB — CBC
HCT: 47.8 % (ref 39.0–52.0)
Hemoglobin: 15.6 g/dL (ref 13.0–17.0)
MCH: 29.5 pg (ref 26.0–34.0)
MCHC: 32.6 g/dL (ref 30.0–36.0)
MCV: 90.4 fL (ref 80.0–100.0)
Platelets: 233 10*3/uL (ref 150–400)
RBC: 5.29 MIL/uL (ref 4.22–5.81)
RDW: 12.4 % (ref 11.5–15.5)
WBC: 9 10*3/uL (ref 4.0–10.5)
nRBC: 0 % (ref 0.0–0.2)

## 2019-08-22 LAB — PROTIME-INR
INR: 1.1 (ref 0.8–1.2)
Prothrombin Time: 14.3 seconds (ref 11.4–15.2)

## 2019-08-22 LAB — SURGICAL PCR SCREEN
MRSA, PCR: NEGATIVE
Staphylococcus aureus: NEGATIVE

## 2019-08-22 LAB — APTT: aPTT: 39 seconds — ABNORMAL HIGH (ref 24–36)

## 2019-08-22 LAB — ABO/RH: ABO/RH(D): O POS

## 2019-08-22 LAB — SARS CORONAVIRUS 2 (TAT 6-24 HRS): SARS Coronavirus 2: NEGATIVE

## 2019-08-22 NOTE — Progress Notes (Signed)
Pre-CABG testing has been completed. Preliminary results can be found in CV Proc through chart review.   08/22/19 10:33 AM Olen Cordial RVT

## 2019-08-22 NOTE — Progress Notes (Signed)
PCP:  DID use Cresenciano Lick, FNP but practice just closed.   Cardiologist:  Norman Herrlich, MD  EKG:  07/31/19 CXR:  08/22/19 ECHO:  06/26/19 Stress Test:  Denies Cardiac Cath:  07/31/19  Covid test 08/22/19  Anesthesia Review:  Yes, cardiac hx.   Patient denies shortness of breath, fever, cough, and chest pain at PAT appointment.  Patient verbalized understanding of instructions provided today at the PAT appointment.  Patient asked to review instructions at home and day of surgery.

## 2019-08-23 ENCOUNTER — Encounter (HOSPITAL_COMMUNITY): Payer: Self-pay | Admitting: Thoracic Surgery (Cardiothoracic Vascular Surgery)

## 2019-08-23 LAB — HEMOGLOBIN A1C
Hgb A1c MFr Bld: 6 % — ABNORMAL HIGH (ref 4.8–5.6)
Mean Plasma Glucose: 126 mg/dL

## 2019-08-23 MED ORDER — MANNITOL 20 % IV SOLN
INTRAVENOUS | Status: DC
  Filled 2019-08-23: qty 13

## 2019-08-23 MED ORDER — PLASMA-LYTE 148 IV SOLN
INTRAVENOUS | Status: DC
  Filled 2019-08-23: qty 2.5

## 2019-08-23 MED ORDER — VANCOMYCIN HCL 1500 MG/300ML IV SOLN
1500.0000 mg | INTRAVENOUS | Status: AC
  Administered 2019-08-24: 1500 mg via INTRAVENOUS
  Filled 2019-08-23: qty 300

## 2019-08-23 MED ORDER — TRANEXAMIC ACID 1000 MG/10ML IV SOLN
1.5000 mg/kg/h | INTRAVENOUS | Status: DC
  Filled 2019-08-23: qty 25

## 2019-08-23 MED ORDER — POTASSIUM CHLORIDE 2 MEQ/ML IV SOLN
80.0000 meq | INTRAVENOUS | Status: DC
  Filled 2019-08-23: qty 40

## 2019-08-23 MED ORDER — DEXMEDETOMIDINE HCL IN NACL 400 MCG/100ML IV SOLN
0.1000 ug/kg/h | INTRAVENOUS | Status: AC
  Administered 2019-08-24: .3 ug/kg/h via INTRAVENOUS
  Filled 2019-08-23: qty 100

## 2019-08-23 MED ORDER — TRANEXAMIC ACID (OHS) BOLUS VIA INFUSION
15.0000 mg/kg | INTRAVENOUS | Status: AC
  Administered 2019-08-24: 08:00:00 2205 mg via INTRAVENOUS
  Filled 2019-08-23: qty 2205

## 2019-08-23 MED ORDER — NOREPINEPHRINE 4 MG/250ML-% IV SOLN
0.0000 ug/min | INTRAVENOUS | Status: DC
  Filled 2019-08-23: qty 250

## 2019-08-23 MED ORDER — TRANEXAMIC ACID (OHS) PUMP PRIME SOLUTION
2.0000 mg/kg | INTRAVENOUS | Status: DC
  Filled 2019-08-23: qty 2.94

## 2019-08-23 MED ORDER — MILRINONE LACTATE IN DEXTROSE 20-5 MG/100ML-% IV SOLN
0.3000 ug/kg/min | INTRAVENOUS | Status: DC
  Filled 2019-08-23: qty 100

## 2019-08-23 MED ORDER — SODIUM CHLORIDE 0.9 % IV SOLN
INTRAVENOUS | Status: DC
  Filled 2019-08-23: qty 30

## 2019-08-23 MED ORDER — PHENYLEPHRINE HCL-NACL 20-0.9 MG/250ML-% IV SOLN
30.0000 ug/min | INTRAVENOUS | Status: AC
  Administered 2019-08-24: 50 ug/min via INTRAVENOUS
  Filled 2019-08-23: qty 250

## 2019-08-23 MED ORDER — TRANEXAMIC ACID 1000 MG/10ML IV SOLN
1.5000 mg/kg/h | INTRAVENOUS | Status: AC
  Administered 2019-08-24: 1.5 mg/kg/h via INTRAVENOUS
  Filled 2019-08-23: qty 25

## 2019-08-23 MED ORDER — VANCOMYCIN HCL 1000 MG IV SOLR
INTRAVENOUS | Status: DC
  Filled 2019-08-23: qty 1000

## 2019-08-23 MED ORDER — SODIUM CHLORIDE 0.9 % IV SOLN
750.0000 mg | INTRAVENOUS | Status: DC
  Filled 2019-08-23: qty 750

## 2019-08-23 MED ORDER — INSULIN REGULAR(HUMAN) IN NACL 100-0.9 UT/100ML-% IV SOLN
INTRAVENOUS | Status: AC
  Administered 2019-08-24: 1 [IU]/h via INTRAVENOUS
  Filled 2019-08-23: qty 100

## 2019-08-23 MED ORDER — SODIUM CHLORIDE 0.9 % IV SOLN
1.5000 g | INTRAVENOUS | Status: AC
  Administered 2019-08-24: 1.5 g via INTRAVENOUS
  Administered 2019-08-24: .75 g via INTRAVENOUS
  Filled 2019-08-23: qty 1.5

## 2019-08-23 MED ORDER — EPINEPHRINE HCL 5 MG/250ML IV SOLN IN NS
0.0000 ug/min | INTRAVENOUS | Status: DC
  Filled 2019-08-23: qty 250

## 2019-08-23 MED ORDER — NITROGLYCERIN IN D5W 200-5 MCG/ML-% IV SOLN
2.0000 ug/min | INTRAVENOUS | Status: DC
  Filled 2019-08-23: qty 250

## 2019-08-23 NOTE — Anesthesia Preprocedure Evaluation (Addendum)
Anesthesia Evaluation  Patient identified by MRN, date of birth, ID band Patient awake    Reviewed: Allergy & Precautions, NPO status , Patient's Chart, lab work & pertinent test results, reviewed documented beta blocker date and time   History of Anesthesia Complications Negative for: history of anesthetic complications  Airway Mallampati: III  TM Distance: >3 FB Neck ROM: Full    Dental no notable dental hx. (+) Dental Advisory Given   Pulmonary Current Smoker and Patient abstained from smoking.,  08/22/2019 SARS coronavirus NEG   Pulmonary exam normal breath sounds clear to auscultation       Cardiovascular hypertension, Pt. on medications and Pt. on home beta blockers + angina (2-3 times per week) + CAD (2v ASCAD: RCA, Cx)  + Valvular Problems/Murmurs AS  Rhythm:Regular Rate:Normal + Systolic murmurs 07/7856 ECHO: EF 60-65%, mod LVH, possible bicuspid Aortic valve with mild-mod AI, severe AS with peak grad 80 mmHg, mean grad 43 mmHg.   Neuro/Psych negative neurological ROS  negative psych ROS   GI/Hepatic Neg liver ROS, GERD  Medicated and Controlled,  Endo/Other  Morbid obesity  Renal/GU negative Renal ROS     Musculoskeletal   Abdominal (+) + obese,   Peds  Hematology HLD   Anesthesia Other Findings   Reproductive/Obstetrics                           Anesthesia Physical Anesthesia Plan  ASA: III  Anesthesia Plan: General   Post-op Pain Management:    Induction: Intravenous  PONV Risk Score and Plan: 1 and Treatment may vary due to age or medical condition  Airway Management Planned: Oral ETT  Additional Equipment: Arterial line, PA Cath, TEE and Ultrasound Guidance Line Placement  Intra-op Plan:   Post-operative Plan: Post-operative intubation/ventilation  Informed Consent: I have reviewed the patients History and Physical, chart, labs and discussed the procedure including  the risks, benefits and alternatives for the proposed anesthesia with the patient or authorized representative who has indicated his/her understanding and acceptance.     Dental advisory given  Plan Discussed with: CRNA and Surgeon  Anesthesia Plan Comments:       Anesthesia Quick Evaluation

## 2019-08-24 ENCOUNTER — Inpatient Hospital Stay (HOSPITAL_COMMUNITY): Payer: 59

## 2019-08-24 ENCOUNTER — Inpatient Hospital Stay (HOSPITAL_COMMUNITY): Payer: 59 | Admitting: Anesthesiology

## 2019-08-24 ENCOUNTER — Encounter (HOSPITAL_COMMUNITY): Payer: Self-pay | Admitting: Thoracic Surgery (Cardiothoracic Vascular Surgery)

## 2019-08-24 ENCOUNTER — Inpatient Hospital Stay (HOSPITAL_COMMUNITY)
Admission: RE | Admit: 2019-08-24 | Discharge: 2019-08-29 | DRG: 220 | Disposition: A | Discharge status: Home / Self Care | Payer: 59 | Attending: Thoracic Surgery (Cardiothoracic Vascular Surgery) | Admitting: Thoracic Surgery (Cardiothoracic Vascular Surgery)

## 2019-08-24 ENCOUNTER — Inpatient Hospital Stay (HOSPITAL_COMMUNITY): Payer: 59 | Admitting: Physician Assistant

## 2019-08-24 ENCOUNTER — Other Ambulatory Visit: Payer: Self-pay

## 2019-08-24 ENCOUNTER — Encounter (HOSPITAL_COMMUNITY)
Admission: RE | Disposition: A | Discharge status: Home / Self Care | Payer: Self-pay | Source: Home / Self Care | Attending: Thoracic Surgery (Cardiothoracic Vascular Surgery)

## 2019-08-24 DIAGNOSIS — J9811 Atelectasis: Secondary | ICD-10-CM | POA: Diagnosis not present

## 2019-08-24 DIAGNOSIS — I350 Nonrheumatic aortic (valve) stenosis: Secondary | ICD-10-CM | POA: Diagnosis present

## 2019-08-24 DIAGNOSIS — D696 Thrombocytopenia, unspecified: Secondary | ICD-10-CM | POA: Diagnosis not present

## 2019-08-24 DIAGNOSIS — I4891 Unspecified atrial fibrillation: Secondary | ICD-10-CM | POA: Diagnosis not present

## 2019-08-24 DIAGNOSIS — I10 Essential (primary) hypertension: Secondary | ICD-10-CM | POA: Diagnosis present

## 2019-08-24 DIAGNOSIS — I2582 Chronic total occlusion of coronary artery: Secondary | ICD-10-CM | POA: Diagnosis present

## 2019-08-24 DIAGNOSIS — E8779 Other fluid overload: Secondary | ICD-10-CM | POA: Diagnosis not present

## 2019-08-24 DIAGNOSIS — Z20822 Contact with and (suspected) exposure to covid-19: Secondary | ICD-10-CM | POA: Diagnosis present

## 2019-08-24 DIAGNOSIS — D72829 Elevated white blood cell count, unspecified: Secondary | ICD-10-CM | POA: Diagnosis not present

## 2019-08-24 DIAGNOSIS — I25110 Atherosclerotic heart disease of native coronary artery with unstable angina pectoris: Secondary | ICD-10-CM | POA: Diagnosis not present

## 2019-08-24 DIAGNOSIS — Z953 Presence of xenogenic heart valve: Secondary | ICD-10-CM

## 2019-08-24 DIAGNOSIS — I2510 Atherosclerotic heart disease of native coronary artery without angina pectoris: Secondary | ICD-10-CM | POA: Diagnosis present

## 2019-08-24 DIAGNOSIS — Z79899 Other long term (current) drug therapy: Secondary | ICD-10-CM | POA: Diagnosis not present

## 2019-08-24 DIAGNOSIS — I352 Nonrheumatic aortic (valve) stenosis with insufficiency: Secondary | ICD-10-CM | POA: Diagnosis present

## 2019-08-24 DIAGNOSIS — D62 Acute posthemorrhagic anemia: Secondary | ICD-10-CM | POA: Diagnosis not present

## 2019-08-24 DIAGNOSIS — I493 Ventricular premature depolarization: Secondary | ICD-10-CM | POA: Diagnosis not present

## 2019-08-24 DIAGNOSIS — Z09 Encounter for follow-up examination after completed treatment for conditions other than malignant neoplasm: Secondary | ICD-10-CM

## 2019-08-24 DIAGNOSIS — Z951 Presence of aortocoronary bypass graft: Secondary | ICD-10-CM

## 2019-08-24 DIAGNOSIS — F17210 Nicotine dependence, cigarettes, uncomplicated: Secondary | ICD-10-CM | POA: Diagnosis present

## 2019-08-24 DIAGNOSIS — Z6841 Body Mass Index (BMI) 40.0 and over, adult: Secondary | ICD-10-CM | POA: Diagnosis not present

## 2019-08-24 DIAGNOSIS — Z888 Allergy status to other drugs, medicaments and biological substances status: Secondary | ICD-10-CM

## 2019-08-24 DIAGNOSIS — Z952 Presence of prosthetic heart valve: Secondary | ICD-10-CM

## 2019-08-24 DIAGNOSIS — Z9889 Other specified postprocedural states: Secondary | ICD-10-CM

## 2019-08-24 HISTORY — PX: TEE WITHOUT CARDIOVERSION: SHX5443

## 2019-08-24 HISTORY — DX: Atherosclerotic heart disease of native coronary artery without angina pectoris: I25.10

## 2019-08-24 HISTORY — DX: Presence of xenogenic heart valve: Z95.3

## 2019-08-24 HISTORY — PX: CORONARY ARTERY BYPASS GRAFT: SHX141

## 2019-08-24 HISTORY — DX: Nonrheumatic aortic (valve) stenosis: I35.0

## 2019-08-24 HISTORY — DX: Body Mass Index (BMI) 40.0 and over, adult: Z684

## 2019-08-24 HISTORY — DX: Presence of aortocoronary bypass graft: Z95.1

## 2019-08-24 HISTORY — PX: AORTIC VALVE REPLACEMENT: SHX41

## 2019-08-24 LAB — POCT I-STAT 7, (LYTES, BLD GAS, ICA,H+H)
Acid-Base Excess: 2 mmol/L (ref 0.0–2.0)
Acid-Base Excess: 4 mmol/L — ABNORMAL HIGH (ref 0.0–2.0)
Acid-Base Excess: 2 mmol/L (ref 0.0–2.0)
Acid-Base Excess: 1 mmol/L (ref 0.0–2.0)
Acid-base deficit: 1 mmol/L (ref 0.0–2.0)
Acid-base deficit: 2 mmol/L (ref 0.0–2.0)
Acid-base deficit: 2 mmol/L (ref 0.0–2.0)
Bicarbonate: 29.3 mmol/L — ABNORMAL HIGH (ref 20.0–28.0)
Bicarbonate: 30 mmol/L — ABNORMAL HIGH (ref 20.0–28.0)
Bicarbonate: 27.5 mmol/L (ref 20.0–28.0)
Bicarbonate: 27 mmol/L (ref 20.0–28.0)
Bicarbonate: 24.8 mmol/L (ref 20.0–28.0)
Bicarbonate: 24.4 mmol/L (ref 20.0–28.0)
Bicarbonate: 24 mmol/L (ref 20.0–28.0)
Calcium, Ion: 1.24 mmol/L (ref 1.15–1.40)
Calcium, Ion: 0.98 mmol/L — ABNORMAL LOW (ref 1.15–1.40)
Calcium, Ion: 0.96 mmol/L — ABNORMAL LOW (ref 1.15–1.40)
Calcium, Ion: 1.11 mmol/L — ABNORMAL LOW (ref 1.15–1.40)
Calcium, Ion: 1.06 mmol/L — ABNORMAL LOW (ref 1.15–1.40)
Calcium, Ion: 1.09 mmol/L — ABNORMAL LOW (ref 1.15–1.40)
Calcium, Ion: 1.1 mmol/L — ABNORMAL LOW (ref 1.15–1.40)
HCT: 43 % (ref 39.0–52.0)
HCT: 28 % — ABNORMAL LOW (ref 39.0–52.0)
HCT: 27 % — ABNORMAL LOW (ref 39.0–52.0)
HCT: 20 % — ABNORMAL LOW (ref 39.0–52.0)
HCT: 22 % — ABNORMAL LOW (ref 39.0–52.0)
HCT: 26 % — ABNORMAL LOW (ref 39.0–52.0)
HCT: 26 % — ABNORMAL LOW (ref 39.0–52.0)
Hemoglobin: 14.6 g/dL (ref 13.0–17.0)
Hemoglobin: 9.5 g/dL — ABNORMAL LOW (ref 13.0–17.0)
Hemoglobin: 9.2 g/dL — ABNORMAL LOW (ref 13.0–17.0)
Hemoglobin: 6.8 g/dL — CL (ref 13.0–17.0)
Hemoglobin: 7.5 g/dL — ABNORMAL LOW (ref 13.0–17.0)
Hemoglobin: 8.8 g/dL — ABNORMAL LOW (ref 13.0–17.0)
Hemoglobin: 8.8 g/dL — ABNORMAL LOW (ref 13.0–17.0)
O2 Saturation: 100 %
O2 Saturation: 100 %
O2 Saturation: 100 %
O2 Saturation: 100 %
O2 Saturation: 98 %
O2 Saturation: 97 %
O2 Saturation: 96 %
Patient temperature: 35.5
Patient temperature: 36.7
Patient temperature: 36.9
Potassium: 4.4 mmol/L (ref 3.5–5.1)
Potassium: 6.1 mmol/L — ABNORMAL HIGH (ref 3.5–5.1)
Potassium: 6.2 mmol/L — ABNORMAL HIGH (ref 3.5–5.1)
Potassium: 4.1 mmol/L (ref 3.5–5.1)
Potassium: 4.4 mmol/L (ref 3.5–5.1)
Potassium: 4.9 mmol/L (ref 3.5–5.1)
Potassium: 4.8 mmol/L (ref 3.5–5.1)
Sodium: 134 mmol/L — ABNORMAL LOW (ref 135–145)
Sodium: 135 mmol/L (ref 135–145)
Sodium: 131 mmol/L — ABNORMAL LOW (ref 135–145)
Sodium: 137 mmol/L (ref 135–145)
Sodium: 138 mmol/L (ref 135–145)
Sodium: 137 mmol/L (ref 135–145)
Sodium: 137 mmol/L (ref 135–145)
TCO2: 31 mmol/L (ref 22–32)
TCO2: 31 mmol/L (ref 22–32)
TCO2: 29 mmol/L (ref 22–32)
TCO2: 28 mmol/L (ref 22–32)
TCO2: 26 mmol/L (ref 22–32)
TCO2: 26 mmol/L (ref 22–32)
TCO2: 25 mmol/L (ref 22–32)
pCO2 arterial: 56.8 mmHg — ABNORMAL HIGH (ref 32.0–48.0)
pCO2 arterial: 50 mmHg — ABNORMAL HIGH (ref 32.0–48.0)
pCO2 arterial: 44.2 mmHg (ref 32.0–48.0)
pCO2 arterial: 48.8 mmHg — ABNORMAL HIGH (ref 32.0–48.0)
pCO2 arterial: 43.8 mmHg (ref 32.0–48.0)
pCO2 arterial: 46 mmHg (ref 32.0–48.0)
pCO2 arterial: 44.1 mmHg (ref 32.0–48.0)
pH, Arterial: 7.32 — ABNORMAL LOW (ref 7.350–7.450)
pH, Arterial: 7.386 (ref 7.350–7.450)
pH, Arterial: 7.402 (ref 7.350–7.450)
pH, Arterial: 7.351 (ref 7.350–7.450)
pH, Arterial: 7.353 (ref 7.350–7.450)
pH, Arterial: 7.332 — ABNORMAL LOW (ref 7.350–7.450)
pH, Arterial: 7.344 — ABNORMAL LOW (ref 7.350–7.450)
pO2, Arterial: 495 mmHg — ABNORMAL HIGH (ref 83.0–108.0)
pO2, Arterial: 383 mmHg — ABNORMAL HIGH (ref 83.0–108.0)
pO2, Arterial: 388 mmHg — ABNORMAL HIGH (ref 83.0–108.0)
pO2, Arterial: 500 mmHg — ABNORMAL HIGH (ref 83.0–108.0)
pO2, Arterial: 104 mmHg (ref 83.0–108.0)
pO2, Arterial: 100 mmHg (ref 83.0–108.0)
pO2, Arterial: 86 mmHg (ref 83.0–108.0)

## 2019-08-24 LAB — GLUCOSE, CAPILLARY
Glucose-Capillary: 124 mg/dL — ABNORMAL HIGH (ref 70–99)
Glucose-Capillary: 134 mg/dL — ABNORMAL HIGH (ref 70–99)
Glucose-Capillary: 140 mg/dL — ABNORMAL HIGH (ref 70–99)
Glucose-Capillary: 145 mg/dL — ABNORMAL HIGH (ref 70–99)
Glucose-Capillary: 150 mg/dL — ABNORMAL HIGH (ref 70–99)
Glucose-Capillary: 163 mg/dL — ABNORMAL HIGH (ref 70–99)
Glucose-Capillary: 168 mg/dL — ABNORMAL HIGH (ref 70–99)
Glucose-Capillary: 178 mg/dL — ABNORMAL HIGH (ref 70–99)

## 2019-08-24 LAB — POCT I-STAT, CHEM 8
BUN: 16 mg/dL (ref 6–20)
BUN: 15 mg/dL (ref 6–20)
BUN: 14 mg/dL (ref 6–20)
BUN: 14 mg/dL (ref 6–20)
BUN: 12 mg/dL (ref 6–20)
BUN: 13 mg/dL (ref 6–20)
BUN: 11 mg/dL (ref 6–20)
BUN: 11 mg/dL (ref 6–20)
Calcium, Ion: 1.27 mmol/L (ref 1.15–1.40)
Calcium, Ion: 1.22 mmol/L (ref 1.15–1.40)
Calcium, Ion: 0.97 mmol/L — ABNORMAL LOW (ref 1.15–1.40)
Calcium, Ion: 1.03 mmol/L — ABNORMAL LOW (ref 1.15–1.40)
Calcium, Ion: 0.91 mmol/L — ABNORMAL LOW (ref 1.15–1.40)
Calcium, Ion: 0.91 mmol/L — ABNORMAL LOW (ref 1.15–1.40)
Calcium, Ion: 1.11 mmol/L — ABNORMAL LOW (ref 1.15–1.40)
Calcium, Ion: 0.83 mmol/L — CL (ref 1.15–1.40)
Chloride: 97 mmol/L — ABNORMAL LOW (ref 98–111)
Chloride: 99 mmol/L (ref 98–111)
Chloride: 96 mmol/L — ABNORMAL LOW (ref 98–111)
Chloride: 95 mmol/L — ABNORMAL LOW (ref 98–111)
Chloride: 96 mmol/L — ABNORMAL LOW (ref 98–111)
Chloride: 97 mmol/L — ABNORMAL LOW (ref 98–111)
Chloride: 100 mmol/L (ref 98–111)
Chloride: 100 mmol/L (ref 98–111)
Creatinine, Ser: 0.9 mg/dL (ref 0.61–1.24)
Creatinine, Ser: 0.8 mg/dL (ref 0.61–1.24)
Creatinine, Ser: 0.7 mg/dL (ref 0.61–1.24)
Creatinine, Ser: 0.7 mg/dL (ref 0.61–1.24)
Creatinine, Ser: 0.7 mg/dL (ref 0.61–1.24)
Creatinine, Ser: 0.7 mg/dL (ref 0.61–1.24)
Creatinine, Ser: 0.7 mg/dL (ref 0.61–1.24)
Creatinine, Ser: 0.8 mg/dL (ref 0.61–1.24)
Glucose, Bld: 135 mg/dL — ABNORMAL HIGH (ref 70–99)
Glucose, Bld: 126 mg/dL — ABNORMAL HIGH (ref 70–99)
Glucose, Bld: 116 mg/dL — ABNORMAL HIGH (ref 70–99)
Glucose, Bld: 122 mg/dL — ABNORMAL HIGH (ref 70–99)
Glucose, Bld: 154 mg/dL — ABNORMAL HIGH (ref 70–99)
Glucose, Bld: 157 mg/dL — ABNORMAL HIGH (ref 70–99)
Glucose, Bld: 125 mg/dL — ABNORMAL HIGH (ref 70–99)
Glucose, Bld: 130 mg/dL — ABNORMAL HIGH (ref 70–99)
HCT: 41 % (ref 39.0–52.0)
HCT: 40 % (ref 39.0–52.0)
HCT: 31 % — ABNORMAL LOW (ref 39.0–52.0)
HCT: 25 % — ABNORMAL LOW (ref 39.0–52.0)
HCT: 22 % — ABNORMAL LOW (ref 39.0–52.0)
HCT: 25 % — ABNORMAL LOW (ref 39.0–52.0)
HCT: 20 % — ABNORMAL LOW (ref 39.0–52.0)
HCT: 22 % — ABNORMAL LOW (ref 39.0–52.0)
Hemoglobin: 13.9 g/dL (ref 13.0–17.0)
Hemoglobin: 13.6 g/dL (ref 13.0–17.0)
Hemoglobin: 10.5 g/dL — ABNORMAL LOW (ref 13.0–17.0)
Hemoglobin: 8.5 g/dL — ABNORMAL LOW (ref 13.0–17.0)
Hemoglobin: 7.5 g/dL — ABNORMAL LOW (ref 13.0–17.0)
Hemoglobin: 8.5 g/dL — ABNORMAL LOW (ref 13.0–17.0)
Hemoglobin: 6.8 g/dL — CL (ref 13.0–17.0)
Hemoglobin: 7.5 g/dL — ABNORMAL LOW (ref 13.0–17.0)
Potassium: 4.5 mmol/L (ref 3.5–5.1)
Potassium: 5.2 mmol/L — ABNORMAL HIGH (ref 3.5–5.1)
Potassium: 6 mmol/L — ABNORMAL HIGH (ref 3.5–5.1)
Potassium: 6.1 mmol/L — ABNORMAL HIGH (ref 3.5–5.1)
Potassium: 5.8 mmol/L — ABNORMAL HIGH (ref 3.5–5.1)
Potassium: 6 mmol/L — ABNORMAL HIGH (ref 3.5–5.1)
Potassium: 4 mmol/L (ref 3.5–5.1)
Potassium: 4 mmol/L (ref 3.5–5.1)
Sodium: 135 mmol/L (ref 135–145)
Sodium: 134 mmol/L — ABNORMAL LOW (ref 135–145)
Sodium: 134 mmol/L — ABNORMAL LOW (ref 135–145)
Sodium: 129 mmol/L — ABNORMAL LOW (ref 135–145)
Sodium: 132 mmol/L — ABNORMAL LOW (ref 135–145)
Sodium: 132 mmol/L — ABNORMAL LOW (ref 135–145)
Sodium: 136 mmol/L (ref 135–145)
Sodium: 137 mmol/L (ref 135–145)
TCO2: 31 mmol/L (ref 22–32)
TCO2: 30 mmol/L (ref 22–32)
TCO2: 30 mmol/L (ref 22–32)
TCO2: 30 mmol/L (ref 22–32)
TCO2: 27 mmol/L (ref 22–32)
TCO2: 30 mmol/L (ref 22–32)
TCO2: 29 mmol/L (ref 22–32)
TCO2: 28 mmol/L (ref 22–32)

## 2019-08-24 LAB — PREPARE RBC (CROSSMATCH)

## 2019-08-24 LAB — BASIC METABOLIC PANEL
Anion gap: 8 (ref 5–15)
BUN: 13 mg/dL (ref 6–20)
CO2: 23 mmol/L (ref 22–32)
Calcium: 7.3 mg/dL — ABNORMAL LOW (ref 8.9–10.3)
Chloride: 105 mmol/L (ref 98–111)
Creatinine, Ser: 0.96 mg/dL (ref 0.61–1.24)
GFR calc Af Amer: >60 mL/min (ref >60)
GFR calc non Af Amer: >60 mL/min (ref >60)
Glucose, Bld: 157 mg/dL — ABNORMAL HIGH (ref 70–99)
Potassium: 4.8 mmol/L (ref 3.5–5.1)
Sodium: 136 mmol/L (ref 135–145)

## 2019-08-24 LAB — CBC
HCT: 24.9 % — ABNORMAL LOW (ref 39.0–52.0)
HCT: 28.1 % — ABNORMAL LOW (ref 39.0–52.0)
Hemoglobin: 8.4 g/dL — ABNORMAL LOW (ref 13.0–17.0)
Hemoglobin: 9.3 g/dL — ABNORMAL LOW (ref 13.0–17.0)
MCH: 30.5 pg (ref 26.0–34.0)
MCH: 29.9 pg (ref 26.0–34.0)
MCHC: 33.7 g/dL (ref 30.0–36.0)
MCHC: 33.1 g/dL (ref 30.0–36.0)
MCV: 90.5 fL (ref 80.0–100.0)
MCV: 90.4 fL (ref 80.0–100.0)
Platelets: 83 10*3/uL — ABNORMAL LOW (ref 150–400)
Platelets: 105 10*3/uL — ABNORMAL LOW (ref 150–400)
RBC: 2.75 MIL/uL — ABNORMAL LOW (ref 4.22–5.81)
RBC: 3.11 MIL/uL — ABNORMAL LOW (ref 4.22–5.81)
RDW: 13.1 % (ref 11.5–15.5)
RDW: 13.3 % (ref 11.5–15.5)
WBC: 15.9 10*3/uL — ABNORMAL HIGH (ref 4.0–10.5)
WBC: 20.8 10*3/uL — ABNORMAL HIGH (ref 4.0–10.5)
nRBC: 0 % (ref 0.0–0.2)
nRBC: 0 % (ref 0.0–0.2)

## 2019-08-24 LAB — ECHO INTRAOPERATIVE TEE
Height: 73 in
Weight: 5200 oz

## 2019-08-24 LAB — PROTIME-INR
INR: 1.8 — ABNORMAL HIGH (ref 0.8–1.2)
Prothrombin Time: 20.9 seconds — ABNORMAL HIGH (ref 11.4–15.2)

## 2019-08-24 LAB — PLATELET COUNT: Platelets: 131 10*3/uL — ABNORMAL LOW (ref 150–400)

## 2019-08-24 LAB — APTT: aPTT: 51 seconds — ABNORMAL HIGH (ref 24–36)

## 2019-08-24 LAB — MAGNESIUM: Magnesium: 2.6 mg/dL — ABNORMAL HIGH (ref 1.7–2.4)

## 2019-08-24 LAB — HEMOGLOBIN AND HEMATOCRIT, BLOOD
HCT: 26.3 % — ABNORMAL LOW (ref 39.0–52.0)
Hemoglobin: 8.9 g/dL — ABNORMAL LOW (ref 13.0–17.0)

## 2019-08-24 SURGERY — REPLACEMENT, AORTIC VALVE, OPEN
Anesthesia: General | Site: Chest

## 2019-08-24 MED ORDER — ACETAMINOPHEN 160 MG/5ML PO SOLN: 650.0000 mg | Freq: Once | ORAL | Status: DC

## 2019-08-24 MED ORDER — BISACODYL 5 MG PO TBEC
10.0000 mg | DELAYED_RELEASE_TABLET | Freq: Every day | ORAL | Status: DC
  Administered 2019-08-25 – 2019-08-28 (×4): 10 mg via ORAL
  Filled 2019-08-24 (×5): qty 2

## 2019-08-24 MED ORDER — PHENYLEPHRINE HCL-NACL 20-0.9 MG/250ML-% IV SOLN
0.0000 ug/min | INTRAVENOUS | Status: DC
  Administered 2019-08-24: 10 ug/min via INTRAVENOUS

## 2019-08-24 MED ORDER — CHLORHEXIDINE GLUCONATE 0.12 % MT SOLN
15.0000 mL | OROMUCOSAL | Status: AC
  Administered 2019-08-24: 15 mL via OROMUCOSAL

## 2019-08-24 MED ORDER — SODIUM CHLORIDE 0.9% IV SOLUTION: Freq: Once | INTRAVENOUS | Status: DC

## 2019-08-24 MED ORDER — PROTAMINE SULFATE 10 MG/ML IV SOLN
INTRAVENOUS | Status: DC | PRN
  Administered 2019-08-24: 490 mg via INTRAVENOUS
  Administered 2019-08-24: 10 mg via INTRAVENOUS

## 2019-08-24 MED ORDER — SODIUM CHLORIDE 0.45 % IV SOLN: INTRAVENOUS | Status: DC | PRN

## 2019-08-24 MED ORDER — LACTATED RINGERS IV SOLN: INTRAVENOUS | Status: DC | PRN

## 2019-08-24 MED ORDER — ALBUMIN HUMAN 5 % IV SOLN
250.0000 mL | INTRAVENOUS | Status: AC | PRN
  Administered 2019-08-24 (×2): 12.5 g via INTRAVENOUS

## 2019-08-24 MED ORDER — SODIUM CHLORIDE 0.9% FLUSH
10.0000 mL | Freq: Two times a day (BID) | INTRAVENOUS | Status: DC
  Administered 2019-08-25: 10 mL
  Administered 2019-08-26: 30 mL
  Administered 2019-08-27 – 2019-08-28 (×2): 10 mL

## 2019-08-24 MED ORDER — ROCURONIUM BROMIDE 10 MG/ML (PF) SYRINGE
PREFILLED_SYRINGE | INTRAVENOUS | Status: DC | PRN
  Administered 2019-08-24 (×3): 50 mg via INTRAVENOUS
  Administered 2019-08-24: 100 mg via INTRAVENOUS
  Administered 2019-08-24: 50 mg via INTRAVENOUS
  Administered 2019-08-24: 30 mg via INTRAVENOUS
  Administered 2019-08-24: 70 mg via INTRAVENOUS

## 2019-08-24 MED ORDER — PLASMA-LYTE 148 IV SOLN
INTRAVENOUS | Status: DC | PRN
  Administered 2019-08-24: 500 mL

## 2019-08-24 MED ORDER — SODIUM CHLORIDE 0.9 % IV SOLN
1.5000 g | Freq: Two times a day (BID) | INTRAVENOUS | Status: AC
  Administered 2019-08-25 – 2019-08-26 (×4): 1.5 g via INTRAVENOUS
  Filled 2019-08-24 (×5): qty 1.5

## 2019-08-24 MED ORDER — CALCIUM CHLORIDE 10 % IV SOLN
INTRAVENOUS | Status: DC | PRN
  Administered 2019-08-24 (×2): 500 mg via INTRAVENOUS

## 2019-08-24 MED ORDER — FENTANYL CITRATE (PF) 250 MCG/5ML IJ SOLN
INTRAMUSCULAR | Status: AC
  Filled 2019-08-24: qty 25

## 2019-08-24 MED ORDER — SODIUM CHLORIDE (PF) 0.9 % IJ SOLN
OROMUCOSAL | Status: DC | PRN
  Administered 2019-08-24 (×3): 4 mL via TOPICAL

## 2019-08-24 MED ORDER — SODIUM CHLORIDE 0.9% FLUSH: 3.0000 mL | INTRAVENOUS | Status: DC | PRN

## 2019-08-24 MED ORDER — SODIUM CHLORIDE 0.9 % IR SOLN
Status: DC | PRN
  Administered 2019-08-24: 5000 mL

## 2019-08-24 MED ORDER — BISACODYL 10 MG RE SUPP: 10.0000 mg | Freq: Every day | RECTAL | Status: DC

## 2019-08-24 MED ORDER — ACETAMINOPHEN 160 MG/5ML PO SOLN: 1000.0000 mg | Freq: Four times a day (QID) | ORAL | Status: DC

## 2019-08-24 MED ORDER — ROCURONIUM BROMIDE 10 MG/ML (PF) SYRINGE
PREFILLED_SYRINGE | INTRAVENOUS | Status: AC
  Filled 2019-08-24: qty 10

## 2019-08-24 MED ORDER — METOPROLOL TARTRATE 5 MG/5ML IV SOLN
2.5000 mg | INTRAVENOUS | Status: DC | PRN
  Administered 2019-08-25: 5 mg via INTRAVENOUS
  Filled 2019-08-24 (×2): qty 5

## 2019-08-24 MED ORDER — SODIUM CHLORIDE 0.9% FLUSH: 10.0000 mL | INTRAVENOUS | Status: DC | PRN

## 2019-08-24 MED ORDER — CHLORHEXIDINE GLUCONATE 0.12 % MT SOLN: 15.0000 mL | Freq: Once | OROMUCOSAL | Status: AC

## 2019-08-24 MED ORDER — PROPOFOL 10 MG/ML IV BOLUS
INTRAVENOUS | Status: AC
  Filled 2019-08-24: qty 40

## 2019-08-24 MED ORDER — DEXTROSE 50 % IV SOLN: 0.0000 mL | INTRAVENOUS | Status: DC | PRN

## 2019-08-24 MED ORDER — ROSUVASTATIN CALCIUM 5 MG PO TABS
5.0000 mg | ORAL_TABLET | Freq: Every day | ORAL | Status: DC
  Administered 2019-08-25 – 2019-08-28 (×4): 5 mg via ORAL
  Filled 2019-08-24 (×4): qty 1

## 2019-08-24 MED ORDER — SODIUM CHLORIDE 0.9 % IV SOLN: INTRAVENOUS | Status: DC

## 2019-08-24 MED ORDER — FENTANYL CITRATE (PF) 250 MCG/5ML IJ SOLN
INTRAMUSCULAR | Status: AC
  Filled 2019-08-24: qty 10

## 2019-08-24 MED ORDER — CHLORHEXIDINE GLUCONATE 0.12% ORAL RINSE (MEDLINE KIT)
15.0000 mL | Freq: Two times a day (BID) | OROMUCOSAL | Status: DC
  Administered 2019-08-24 – 2019-08-29 (×4): 15 mL via OROMUCOSAL

## 2019-08-24 MED ORDER — LACTATED RINGERS IV SOLN: INTRAVENOUS | Status: DC

## 2019-08-24 MED ORDER — MORPHINE SULFATE (PF) 2 MG/ML IV SOLN
1.0000 mg | INTRAVENOUS | Status: DC | PRN
  Administered 2019-08-25: 1 mg via INTRAVENOUS
  Administered 2019-08-25 – 2019-08-26 (×2): 2 mg via INTRAVENOUS
  Filled 2019-08-24 (×3): qty 1

## 2019-08-24 MED ORDER — FENTANYL CITRATE (PF) 250 MCG/5ML IJ SOLN
INTRAMUSCULAR | Status: AC
  Filled 2019-08-24: qty 5

## 2019-08-24 MED ORDER — CHLORHEXIDINE GLUCONATE CLOTH 2 % EX PADS
6.0000 | MEDICATED_PAD | Freq: Every day | CUTANEOUS | Status: DC
  Administered 2019-08-24 – 2019-08-27 (×4): 6 via TOPICAL

## 2019-08-24 MED ORDER — ONDANSETRON HCL 4 MG/2ML IJ SOLN
4.0000 mg | Freq: Four times a day (QID) | INTRAMUSCULAR | Status: DC | PRN
  Administered 2019-08-24 – 2019-08-27 (×4): 4 mg via INTRAVENOUS
  Filled 2019-08-24 (×5): qty 2

## 2019-08-24 MED ORDER — METOPROLOL TARTRATE 12.5 MG HALF TABLET
ORAL_TABLET | ORAL | Status: AC
  Filled 2019-08-24: qty 1

## 2019-08-24 MED ORDER — MAGNESIUM SULFATE 4 GM/100ML IV SOLN
4.0000 g | Freq: Once | INTRAVENOUS | Status: AC
  Administered 2019-08-24: 4 g via INTRAVENOUS

## 2019-08-24 MED ORDER — POTASSIUM CHLORIDE 10 MEQ/50ML IV SOLN: 10.0000 meq | INTRAVENOUS | Status: AC

## 2019-08-24 MED ORDER — MIDAZOLAM HCL 5 MG/5ML IJ SOLN
INTRAMUSCULAR | Status: DC | PRN
  Administered 2019-08-24 (×2): 2 mg via INTRAVENOUS
  Administered 2019-08-24 (×3): 1 mg via INTRAVENOUS
  Administered 2019-08-24: 3 mg via INTRAVENOUS

## 2019-08-24 MED ORDER — METOPROLOL TARTRATE 12.5 MG HALF TABLET: 12.5000 mg | ORAL_TABLET | Freq: Once | ORAL | Status: DC

## 2019-08-24 MED ORDER — HEPARIN SODIUM (PORCINE) 1000 UNIT/ML IJ SOLN
INTRAMUSCULAR | Status: DC | PRN
  Administered 2019-08-24: 50000 [IU] via INTRAVENOUS

## 2019-08-24 MED ORDER — ASPIRIN 81 MG PO CHEW: 324.0000 mg | CHEWABLE_TABLET | Freq: Every day | ORAL | Status: DC

## 2019-08-24 MED ORDER — VANCOMYCIN HCL IN DEXTROSE 1-5 GM/200ML-% IV SOLN
1000.0000 mg | Freq: Once | INTRAVENOUS | Status: AC
  Administered 2019-08-24: 1000 mg via INTRAVENOUS
  Filled 2019-08-24: qty 200

## 2019-08-24 MED ORDER — ACETAMINOPHEN 500 MG PO TABS
1000.0000 mg | ORAL_TABLET | Freq: Four times a day (QID) | ORAL | Status: DC
  Administered 2019-08-24 – 2019-08-29 (×17): 1000 mg via ORAL
  Filled 2019-08-24 (×18): qty 2

## 2019-08-24 MED ORDER — ASPIRIN EC 325 MG PO TBEC
325.0000 mg | DELAYED_RELEASE_TABLET | Freq: Every day | ORAL | Status: DC
  Administered 2019-08-25 – 2019-08-29 (×5): 325 mg via ORAL
  Filled 2019-08-24 (×5): qty 1

## 2019-08-24 MED ORDER — SODIUM CHLORIDE 0.9% FLUSH
3.0000 mL | Freq: Two times a day (BID) | INTRAVENOUS | Status: DC
  Administered 2019-08-25 – 2019-08-29 (×6): 3 mL via INTRAVENOUS

## 2019-08-24 MED ORDER — PANTOPRAZOLE SODIUM 40 MG PO TBEC
40.0000 mg | DELAYED_RELEASE_TABLET | Freq: Every day | ORAL | Status: DC
  Administered 2019-08-26 – 2019-08-29 (×4): 40 mg via ORAL
  Filled 2019-08-24 (×4): qty 1

## 2019-08-24 MED ORDER — SODIUM CHLORIDE 0.9 % IV SOLN: 250.0000 mL | INTRAVENOUS | Status: DC

## 2019-08-24 MED ORDER — MIDAZOLAM HCL 2 MG/2ML IJ SOLN: 2.0000 mg | INTRAMUSCULAR | Status: DC | PRN

## 2019-08-24 MED ORDER — PROTAMINE SULFATE 10 MG/ML IV SOLN
INTRAVENOUS | Status: AC
  Filled 2019-08-24: qty 50

## 2019-08-24 MED ORDER — INSULIN REGULAR(HUMAN) IN NACL 100-0.9 UT/100ML-% IV SOLN
INTRAVENOUS | Status: DC
  Filled 2019-08-24: qty 100

## 2019-08-24 MED ORDER — LACTATED RINGERS IV SOLN: 500.0000 mL | Freq: Once | INTRAVENOUS | Status: DC | PRN

## 2019-08-24 MED ORDER — DOCUSATE SODIUM 100 MG PO CAPS
200.0000 mg | ORAL_CAPSULE | Freq: Every day | ORAL | Status: DC
  Administered 2019-08-25 – 2019-08-28 (×4): 200 mg via ORAL
  Filled 2019-08-24 (×5): qty 2

## 2019-08-24 MED ORDER — DEXMEDETOMIDINE HCL IN NACL 400 MCG/100ML IV SOLN: 0.0000 ug/kg/h | INTRAVENOUS | Status: DC

## 2019-08-24 MED ORDER — NITROGLYCERIN IN D5W 200-5 MCG/ML-% IV SOLN
0.0000 ug/min | INTRAVENOUS | Status: DC
  Administered 2019-08-24: 10 ug/min via INTRAVENOUS
  Administered 2019-08-25: 90 ug/min via INTRAVENOUS
  Filled 2019-08-24 (×3): qty 250

## 2019-08-24 MED ORDER — ORAL CARE MOUTH RINSE
15.0000 mL | OROMUCOSAL | Status: DC
  Administered 2019-08-24: 15 mL via OROMUCOSAL

## 2019-08-24 MED ORDER — ACETAMINOPHEN 650 MG RE SUPP: 650.0000 mg | Freq: Once | RECTAL | Status: DC

## 2019-08-24 MED ORDER — CHLORHEXIDINE GLUCONATE 4 % EX LIQD: 30.0000 mL | CUTANEOUS | Status: DC

## 2019-08-24 MED ORDER — MIDAZOLAM HCL (PF) 10 MG/2ML IJ SOLN
INTRAMUSCULAR | Status: AC
  Filled 2019-08-24: qty 2

## 2019-08-24 MED ORDER — HEPARIN SODIUM (PORCINE) 1000 UNIT/ML IJ SOLN
INTRAMUSCULAR | Status: AC
  Filled 2019-08-24: qty 1

## 2019-08-24 MED ORDER — FENTANYL CITRATE (PF) 250 MCG/5ML IJ SOLN
INTRAMUSCULAR | Status: DC | PRN
  Administered 2019-08-24: 50 ug via INTRAVENOUS
  Administered 2019-08-24: 100 ug via INTRAVENOUS
  Administered 2019-08-24: 600 ug via INTRAVENOUS
  Administered 2019-08-24: 50 ug via INTRAVENOUS
  Administered 2019-08-24: 100 ug via INTRAVENOUS
  Administered 2019-08-24 (×2): 50 ug via INTRAVENOUS
  Administered 2019-08-24: 150 ug via INTRAVENOUS
  Administered 2019-08-24: 100 ug via INTRAVENOUS
  Administered 2019-08-24: 250 ug via INTRAVENOUS
  Administered 2019-08-24: 50 ug via INTRAVENOUS
  Administered 2019-08-24 (×2): 125 ug via INTRAVENOUS
  Administered 2019-08-24 (×2): 100 ug via INTRAVENOUS

## 2019-08-24 MED ORDER — VANCOMYCIN HCL 1000 MG IV SOLR
INTRAVENOUS | Status: DC | PRN
  Administered 2019-08-24: 1000 mL

## 2019-08-24 MED ORDER — PROPOFOL 10 MG/ML IV BOLUS
INTRAVENOUS | Status: DC | PRN
  Administered 2019-08-24: 20 mg via INTRAVENOUS

## 2019-08-24 MED ORDER — ALBUMIN HUMAN 5 % IV SOLN: INTRAVENOUS | Status: DC | PRN

## 2019-08-24 MED ORDER — FAMOTIDINE IN NACL 20-0.9 MG/50ML-% IV SOLN
20.0000 mg | Freq: Two times a day (BID) | INTRAVENOUS | Status: DC
  Administered 2019-08-24: 20 mg via INTRAVENOUS

## 2019-08-24 MED ORDER — TRAMADOL HCL 50 MG PO TABS
50.0000 mg | ORAL_TABLET | ORAL | Status: DC | PRN
  Administered 2019-08-25: 100 mg via ORAL
  Administered 2019-08-27: 50 mg via ORAL
  Filled 2019-08-24: qty 1
  Filled 2019-08-24: qty 2

## 2019-08-24 MED ORDER — HEPARIN SODIUM (PORCINE) 1000 UNIT/ML IJ SOLN
INTRAMUSCULAR | Status: AC
  Filled 2019-08-24: qty 2

## 2019-08-24 MED ORDER — CHLORHEXIDINE GLUCONATE 0.12 % MT SOLN
OROMUCOSAL | Status: AC
  Administered 2019-08-24: 15 mL via OROMUCOSAL
  Filled 2019-08-24: qty 15

## 2019-08-24 MED ORDER — OXYCODONE HCL 5 MG PO TABS
5.0000 mg | ORAL_TABLET | ORAL | Status: DC | PRN
  Administered 2019-08-24: 5 mg via ORAL
  Administered 2019-08-25 – 2019-08-27 (×6): 10 mg via ORAL
  Filled 2019-08-24 (×6): qty 2
  Filled 2019-08-24: qty 1
  Filled 2019-08-24: qty 2

## 2019-08-24 SURGICAL SUPPLY — 142 items
ADAPTER CARDIO PERF ANTE/RETRO (ADAPTER) ×3 IMPLANT
APPLIER CLIP 9.375 SM OPEN (CLIP) ×6
BAG DECANTER FOR FLEXI CONT (MISCELLANEOUS) ×6 IMPLANT
BASKET HEART (ORDER IN 25'S) (MISCELLANEOUS) ×1
BASKET HEART (ORDER IN 25S) (MISCELLANEOUS) ×2 IMPLANT
BLADE CLIPPER SURG (BLADE) ×3 IMPLANT
BLADE STERNUM SYSTEM 6 (BLADE) ×3 IMPLANT
BLADE SURG 10 STRL SS (BLADE) ×3 IMPLANT
BLADE SURG 11 STRL SS (BLADE) ×3 IMPLANT
BNDG ELASTIC 4X5.8 VLCR STR LF (GAUZE/BANDAGES/DRESSINGS) ×6 IMPLANT
BNDG ELASTIC 6X10 VLCR STRL LF (GAUZE/BANDAGES/DRESSINGS) ×6 IMPLANT
BNDG ELASTIC 6X5.8 VLCR STR LF (GAUZE/BANDAGES/DRESSINGS) ×3 IMPLANT
BNDG GAUZE ELAST 4 BULKY (GAUZE/BANDAGES/DRESSINGS) ×6 IMPLANT
CANISTER SUCT 3000ML PPV (MISCELLANEOUS) ×3 IMPLANT
CANNULA EZ GLIDE 8.0 24FR (CANNULA) ×3 IMPLANT
CANNULA EZ GLIDE AORTIC 21FR (CANNULA) ×6 IMPLANT
CANNULA GUNDRY RCSP 15FR (MISCELLANEOUS) ×3 IMPLANT
CATH CPB KIT OWEN (MISCELLANEOUS) ×3 IMPLANT
CATH HEART VENT LEFT (CATHETERS) ×2 IMPLANT
CATH THORACIC 36FR (CATHETERS) ×3 IMPLANT
CATH THORACIC 36FR RT ANG (CATHETERS) ×3 IMPLANT
CLIP APPLIE 9.375 SM OPEN (CLIP) ×4 IMPLANT
CLIP RETRACTION 3.0MM CORONARY (MISCELLANEOUS) ×6 IMPLANT
CLIP VESOCCLUDE MED 24/CT (CLIP) IMPLANT
CLIP VESOCCLUDE SM WIDE 24/CT (CLIP) IMPLANT
COVER SURGICAL LIGHT HANDLE (MISCELLANEOUS) ×3 IMPLANT
DEFOGGER ANTIFOG KIT (MISCELLANEOUS) ×6 IMPLANT
DERMABOND ADVANCED (GAUZE/BANDAGES/DRESSINGS) ×1
DERMABOND ADVANCED .7 DNX12 (GAUZE/BANDAGES/DRESSINGS) ×2 IMPLANT
DEVICE SUT CK QUICK LOAD INDV (Prosthesis & Implant Heart) ×3 IMPLANT
DEVICE SUT CK QUICK LOAD MINI (Prosthesis & Implant Heart) ×3 IMPLANT
DRAIN CHANNEL 32F RND 10.7 FF (WOUND CARE) ×6 IMPLANT
DRAPE CARDIOVASCULAR INCISE (DRAPES) ×1
DRAPE CV SPLIT W-CLR ANES SCRN (DRAPES) IMPLANT
DRAPE INCISE IOBAN 66X45 STRL (DRAPES) ×6 IMPLANT
DRAPE PERI GROIN 82X75IN TIB (DRAPES) IMPLANT
DRAPE SLUSH/WARMER DISC (DRAPES) ×3 IMPLANT
DRAPE SRG 135X102X78XABS (DRAPES) ×2 IMPLANT
DRSG AQUACEL AG ADV 3.5X14 (GAUZE/BANDAGES/DRESSINGS) ×3 IMPLANT
ELECT BLADE 4.0 EZ CLEAN MEGAD (MISCELLANEOUS) ×3
ELECT REM PT RETURN 9FT ADLT (ELECTROSURGICAL) ×6
ELECTRODE BLDE 4.0 EZ CLN MEGD (MISCELLANEOUS) ×2 IMPLANT
ELECTRODE REM PT RTRN 9FT ADLT (ELECTROSURGICAL) ×4 IMPLANT
FELT TEFLON 1X6 (MISCELLANEOUS) ×9 IMPLANT
FIBERTAPE STERNAL CLSR 2 36IN (SUTURE) ×9 IMPLANT
FIBERTAPE STERNAL CLSR 2X36 (SUTURE) ×12 IMPLANT
GAUZE SPONGE 4X4 12PLY STRL (GAUZE/BANDAGES/DRESSINGS) ×9 IMPLANT
GLOVE BIO SURGEON STRL SZ 6 (GLOVE) ×9 IMPLANT
GLOVE BIO SURGEON STRL SZ 6.5 (GLOVE) ×9 IMPLANT
GLOVE BIO SURGEON STRL SZ7 (GLOVE) IMPLANT
GLOVE BIO SURGEON STRL SZ7.5 (GLOVE) ×6 IMPLANT
GLOVE BIOGEL PI IND STRL 6.5 (GLOVE) ×2 IMPLANT
GLOVE BIOGEL PI IND STRL 8 (GLOVE) ×4 IMPLANT
GLOVE BIOGEL PI INDICATOR 6.5 (GLOVE) ×1
GLOVE BIOGEL PI INDICATOR 8 (GLOVE) ×2
GLOVE ORTHO TXT STRL SZ7.5 (GLOVE) ×9 IMPLANT
GOWN STRL REUS W/ TWL LRG LVL3 (GOWN DISPOSABLE) ×20 IMPLANT
GOWN STRL REUS W/TWL LRG LVL3 (GOWN DISPOSABLE) ×10
HEMOSTAT POWDER SURGIFOAM 1G (HEMOSTASIS) ×21 IMPLANT
INSERT FOGARTY XLG (MISCELLANEOUS) ×3 IMPLANT
IV NS IRRIG 3000ML ARTHROMATIC (IV SOLUTION) ×3 IMPLANT
KIT BASIN OR (CUSTOM PROCEDURE TRAY) ×3 IMPLANT
KIT SUCTION CATH 14FR (SUCTIONS) ×9 IMPLANT
KIT SUT CK MINI COMBO 4X17 (Prosthesis & Implant Heart) ×3 IMPLANT
KIT TURNOVER KIT B (KITS) ×3 IMPLANT
KIT VASOVIEW HEMOPRO 2 VH 4000 (KITS) ×3 IMPLANT
LEAD PACING MYOCARDI (MISCELLANEOUS) ×6 IMPLANT
LINE VENT (MISCELLANEOUS) ×3 IMPLANT
MARKER GRAFT CORONARY BYPASS (MISCELLANEOUS) ×9 IMPLANT
NDL SUT PASSING CERCLAGE MED (SUTURE) ×3
NEEDLE SUT PASSING CERCLAG MED (SUTURE) ×2 IMPLANT
NS IRRIG 1000ML POUR BTL (IV SOLUTION) ×15 IMPLANT
PACK E OPEN HEART (SUTURE) ×3 IMPLANT
PACK OPEN HEART (CUSTOM PROCEDURE TRAY) ×3 IMPLANT
PAD ARMBOARD 7.5X6 YLW CONV (MISCELLANEOUS) ×6 IMPLANT
PAD ELECT DEFIB RADIOL ZOLL (MISCELLANEOUS) ×3 IMPLANT
PENCIL BUTTON HOLSTER BLD 10FT (ELECTRODE) ×3 IMPLANT
POSITIONER HEAD DONUT 9IN (MISCELLANEOUS) ×3 IMPLANT
PUNCH AORTIC ROTATE 4.0MM (MISCELLANEOUS) ×3 IMPLANT
PUNCH AORTIC ROTATE 4.5MM 8IN (MISCELLANEOUS) IMPLANT
PUNCH AORTIC ROTATE 5MM 8IN (MISCELLANEOUS) IMPLANT
SET CARDIOPLEGIA MPS 5001102 (MISCELLANEOUS) ×3 IMPLANT
SET IRRIG TUBING LAPAROSCOPIC (IRRIGATION / IRRIGATOR) ×3 IMPLANT
SOL ANTI FOG 6CC (MISCELLANEOUS) IMPLANT
SOLUTION ANTI FOG 6CC (MISCELLANEOUS)
SPONGE LAP 18X18 RF (DISPOSABLE) ×12 IMPLANT
SPONGE LAP 18X18 X RAY DECT (DISPOSABLE) ×3 IMPLANT
SPONGE LAP 4X18 RFD (DISPOSABLE) ×3 IMPLANT
SUT BONE WAX W31G (SUTURE) ×3 IMPLANT
SUT ETHIBON 2 0 V 52N 30 (SUTURE) ×12 IMPLANT
SUT ETHIBON EXCEL 2-0 V-5 (SUTURE) IMPLANT
SUT ETHIBOND 2 0 SH (SUTURE)
SUT ETHIBOND 2 0 SH 36X2 (SUTURE) IMPLANT
SUT ETHIBOND 2 0 V4 (SUTURE) IMPLANT
SUT ETHIBOND 2 0V4 GREEN (SUTURE) IMPLANT
SUT ETHIBOND 4 0 RB 1 (SUTURE) IMPLANT
SUT ETHIBOND V-5 VALVE (SUTURE) IMPLANT
SUT ETHIBOND X763 2 0 SH 1 (SUTURE) ×15 IMPLANT
SUT MNCRL AB 3-0 PS2 18 (SUTURE) ×6 IMPLANT
SUT MNCRL AB 4-0 PS2 18 (SUTURE) IMPLANT
SUT PDS AB 1 CTX 36 (SUTURE) ×6 IMPLANT
SUT PROLENE 2 0 SH DA (SUTURE) IMPLANT
SUT PROLENE 3 0 SH DA (SUTURE) ×3 IMPLANT
SUT PROLENE 3 0 SH1 36 (SUTURE) ×6 IMPLANT
SUT PROLENE 4 0 RB 1 (SUTURE) ×5
SUT PROLENE 4 0 SH DA (SUTURE) ×6 IMPLANT
SUT PROLENE 4-0 RB1 .5 CRCL 36 (SUTURE) ×10 IMPLANT
SUT PROLENE 5 0 C 1 36 (SUTURE) IMPLANT
SUT PROLENE 6 0 C 1 30 (SUTURE) ×12 IMPLANT
SUT PROLENE 7.0 RB 3 (SUTURE) ×27 IMPLANT
SUT PROLENE 8 0 BV175 6 (SUTURE) IMPLANT
SUT PROLENE BLUE 7 0 (SUTURE) ×6 IMPLANT
SUT PROLENE POLY MONO (SUTURE) ×9 IMPLANT
SUT SILK  1 MH (SUTURE) ×1
SUT SILK 1 MH (SUTURE) ×2 IMPLANT
SUT SILK 2 0 SH CR/8 (SUTURE) ×6 IMPLANT
SUT SILK 3 0 SH CR/8 (SUTURE) IMPLANT
SUT STEEL 6MS V (SUTURE) IMPLANT
SUT STEEL STERNAL CCS#1 18IN (SUTURE) IMPLANT
SUT STEEL SZ 6 DBL 3X14 BALL (SUTURE) IMPLANT
SUT VIC AB 1 CTX 36 (SUTURE)
SUT VIC AB 1 CTX36XBRD ANBCTR (SUTURE) IMPLANT
SUT VIC AB 2-0 CT1 27 (SUTURE) ×3
SUT VIC AB 2-0 CT1 TAPERPNT 27 (SUTURE) ×6 IMPLANT
SUT VIC AB 2-0 CTX 27 (SUTURE) IMPLANT
SUT VIC AB 3-0 SH 27 (SUTURE)
SUT VIC AB 3-0 SH 27X BRD (SUTURE) IMPLANT
SUT VIC AB 3-0 X1 27 (SUTURE) ×9 IMPLANT
SUT VICRYL 4-0 PS2 18IN ABS (SUTURE) IMPLANT
SYSTEM SAHARA CHEST DRAIN ATS (WOUND CARE) ×3 IMPLANT
TAPE CLOTH SURG 4X10 WHT LF (GAUZE/BANDAGES/DRESSINGS) ×3 IMPLANT
TAPE PAPER 2X10 WHT MICROPORE (GAUZE/BANDAGES/DRESSINGS) ×3 IMPLANT
TOWEL GREEN STERILE (TOWEL DISPOSABLE) ×3 IMPLANT
TOWEL GREEN STERILE FF (TOWEL DISPOSABLE) ×3 IMPLANT
TRAY FOLEY SLVR 16FR TEMP STAT (SET/KITS/TRAYS/PACK) ×3 IMPLANT
TUBE CONNECTING 12X1/4 (SUCTIONS) ×3 IMPLANT
TUBING LAP HI FLOW INSUFFLATIO (TUBING) ×3 IMPLANT
UNDERPAD 30X30 (UNDERPADS AND DIAPERS) ×3 IMPLANT
VALVE AORTIC SZ25 INSP/RESIL (Prosthesis & Implant Heart) ×3 IMPLANT
VENT LEFT HEART 12002 (CATHETERS) ×3
WATER STERILE IRR 1000ML POUR (IV SOLUTION) ×6 IMPLANT
YANKAUER SUCT BULB TIP NO VENT (SUCTIONS) ×3 IMPLANT

## 2019-08-24 NOTE — Anesthesia Postprocedure Evaluation (Signed)
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[place]  Procedure(s) Performed: AORTIC VALVE REPLACEMENT (AVR) USING INSPIRIS VALVE SIZE (N/A Chest) CORONARY ARTERY BYPASS GRAFTING (CABG) x two, using bilateral leg greater saphenous veins harvested endoscopically (N/A Chest) TRANSESOPHAGEAL ECHOCARDIOGRAM (TEE) (N/A )     Patient location during evaluation: SICU Anesthesia Type: General Level of consciousness: patient remains intubated per anesthesia plan and sedated Pain management: pain level controlled Vital Signs Assessment: post-procedure vital signs reviewed and stable Respiratory status: patient on ventilator - see flowsheet for VS and patient remains intubated per anesthesia plan Cardiovascular status: stable (BP managed with NTG gtt) Postop Assessment: no apparent nausea or vomiting Anesthetic complications: no    Last Vitals:  Vitals:   08/24/19 1716 08/24/19 1730  BP:  105/75  Pulse: 80 80  Resp: 14 15  Temp: (!) 36 C (!) 36.1 C  SpO2: 100% 100%    Last Pain:  Vitals:   08/24/19 1716  TempSrc: Core (Comment)  PainSc:                  Erling Cruz. Benelli Winther

## 2019-08-24 NOTE — Procedures (Signed)
Extubation Procedure Note  Patient Details:   Name: Antonio Riley DOB: 1965-07-29 MRN: [id number]   Airway Documentation:    Vent end date: 08/24/19 Vent end time: 1855   Evaluation  O2 sats: stable throughout Complications: No apparent complications Patient did tolerate procedure well. Bilateral Breath Sounds: Clear, Diminished   Yes  4l/min [name] NIF-25 FVC 1.7L No cuff leak, confirmed with Dr. Cornelius Moras to extubate, patient tolerated procedure well well.  [name] 08/24/2019, 7:00 PM

## 2019-08-24 NOTE — Transfer of Care (Signed)
Immediate Anesthesia Transfer of Care Note  Patient: Shriyans Kuenzi [place]  Procedure(s) Performed: AORTIC VALVE REPLACEMENT (AVR) USING INSPIRIS VALVE SIZE (N/A Chest) CORONARY ARTERY BYPASS GRAFTING (CABG) x two, using bilateral leg greater saphenous veins harvested endoscopically (N/A Chest) TRANSESOPHAGEAL ECHOCARDIOGRAM (TEE) (N/A )  Patient Location: SICU  Anesthesia Type:General  Level of Consciousness: Patient remains intubated per anesthesia plan  Airway & Oxygen Therapy: Patient remains intubated per anesthesia plan  Post-op Assessment: Report given to RN and Post -op Vital signs reviewed and stable  Post vital signs: Reviewed and stable  Last Vitals:  Vitals Value Taken Time  BP    Temp 35.3 C 08/24/19 1549  Pulse 80 08/24/19 1549  Resp 19 08/24/19 1549  SpO2 100 % 08/24/19 1549  Vitals shown include unvalidated device data.  Last Pain:  Vitals:   08/24/19 0618  TempSrc:   PainSc: 0-No pain      Patients Stated Pain Goal: 3 (08/24/19 0618)  Complications: No apparent anesthesia complications

## 2019-08-24 NOTE — Interval H&P Note (Signed)
History and Physical Interval Note:  08/24/2019 6:21 AM  Antonio Riley  has presented today for surgery, with the diagnosis of AS CAD.  The various methods of treatment have been discussed with the patient and family. After consideration of risks, benefits and other options for treatment, the patient has consented to  Procedure(s): AORTIC VALVE REPLACEMENT (AVR) (N/A) CORONARY ARTERY BYPASS GRAFTING (CABG) (N/A) TRANSESOPHAGEAL ECHOCARDIOGRAM (TEE) (N/A) as a surgical intervention.  The patient's history has been reviewed, patient examined, no change in status, stable for surgery.  I have reviewed the patient's chart and labs.  Questions were answered to the patient's satisfaction.     Purcell Nails

## 2019-08-24 NOTE — Progress Notes (Signed)
  Echocardiogram Echocardiogram Transesophageal has been performed.  Antonio Riley 08/24/2019, 8:51 AM

## 2019-08-24 NOTE — Anesthesia Procedure Notes (Signed)
Arterial Line Insertion Start/End4/01/2020 6:50 AM, 08/24/2019 6:55 AM  Patient location: Pre-op. Preanesthetic checklist: patient identified, IV checked, site marked, risks and benefits discussed, surgical consent, monitors and equipment checked, pre-op evaluation, timeout performed and anesthesia consent Lidocaine 1% used for infiltration Left, radial was placed Catheter size: 20 Fr Hand hygiene performed  and maximum sterile barriers used   Attempts: 1 Procedure performed without using ultrasound guided technique. Following insertion, dressing applied. Post procedure assessment: normal and unchanged

## 2019-08-24 NOTE — Op Note (Addendum)
CARDIOTHORACIC SURGERY OPERATIVE NOTE  Date of Procedure:  08/24/2019  Preoperative Diagnosis:   Severe Aortic Stenosis  Severe 2-vessel Coronary Artery Disease  Postoperative Diagnosis: Same  Procedure:    Aortic Valve Replacement  Edwards Inspiris Resilia Stented Bovine Pericardial Tissue Valve (size 81mm, ref # [id number], serial # [id number])   Coronary Artery Bypass Grafting x 2  Saphenous Vein Graft to Distal Right Coronary Artery  Saphenous Vein Graft to Obtuse Marginal Branch of Left Circumflex Coronary Artery  Endoscopic Vein Harvest from Bilateral Thighs  Surgeon: Valentina Gu. Roxy Manns, MD  Assistant: John Giovanni, PA-C  Anesthesia: Midge Minium, MD  Operative Findings:  Intermediate type bicuspid aortic valve with incomplete fusion of right and non-coronary leaflets  Severe aortic stenosis with moderate aortic insufficiency  Normal left ventricular systolic function  Good quality saphenous vein conduit  Fair to good quality target vessels for grafting           BRIEF CLINICAL NOTE AND INDICATIONS FOR SURGERY  Patient is 54 year old morbidly obese white male with history of hypertension who has been referred for surgical consultation to discuss treatment options for management ofsevere symptomatic aortic stenosis.  Patient states that he was first noted to have a heart murmur several years ago by his primary care physician. He was referred for cardiology consultation and evaluated by Dr. Bettina Gavia November 2019. Transthoracic echocardiogram was ordered but never performed. Patient states that he did not go through with echocardiogram because of the high associated out-of-pocket expense. The patient was recently seen in follow-up by Dr. Bettina Gavia and transthoracic echocardiogram eventually performed June 26, 2019. Echocardiogram revealed normal left ventricular systolic function with likely bicuspid aortic valve and severe aortic stenosis. Cardiothoracic  surgical consultation was requested.  The patient has been seen in consultation and counseled at length regarding the indications, risks and potential benefits of surgery.  All questions have been answered, and the patient provides full informed consent for the operation as described.    DETAILS OF THE OPERATIVE PROCEDURE  Preparation:  The patient is brought to the operating room on the above mentioned date and central monitoring was established by the anesthesia team including placement of Swan-Ganz catheter and radial arterial line. The patient is placed in the supine position on the operating table.  Intravenous antibiotics are administered. General endotracheal anesthesia is induced uneventfully. A Foley catheter is placed.  Baseline transesophageal echocardiogram was performed.  Findings were notable for severe aortic stenosis with moderate aortic insufficiency.  There was normal left ventricular systolic function.  No other significant abnormalities were noted.  The patient's chest, abdomen, both groins, and both lower extremities are prepared and draped in a sterile manner. A time out procedure is performed.   Surgical Approach and Conduit Harvest:  A median sternotomy incision was performed. Simultaneously, the greater saphenous vein is obtained from the patient's right using endoscopic vein harvest technique. A portion of the saphenous vein is good quality conduit, but the patient had a bifid system and portions of it were not usable.  Subsequently an additional segment of greater saphenous vein was obtained the patient's left thigh using endoscopic vein harvest technique.  The additional saphenous vein is notably good quality conduit. After removal of the saphenous vein, the small surgical incisions in the lower extremity are closed with absorbable suture.    Extracorporeal Cardiopulmonary Bypass and Myocardial Protection:  The pericardium is opened. The ascending aorta is normal  in appearance. The ascending aorta and the right atrium are cannulated for  cardiopulmonary bypass.  Adequate heparinization is verified.   A retrograde cardioplegia cannula is placed through the right atrium into the coronary sinus.  The operative field was continuously flooded with carbon dioxide gas.  The entire pre-bypass portion of the operation was notable for stable hemodynamics.  Cardiopulmonary bypass was begun and a left ventricular vent placed through the right superior pulmonary vein.  The surface of the heart inspected. Distal target vessels are selected for coronary artery bypass grafting. A cardioplegia cannula is placed in the ascending aorta.  A temperature probe was placed in the interventricular septum.  The patient is cooled to 32C systemic temperature.  The aortic cross clamp is applied and cardioplegia is delivered initially in an antegrade fashion through the aortic root using modified [place] cold blood cardioplegia ([place] blood cardioplegia protocol).   The initial cardioplegic arrest is rapid with early diastolic arrest.  Repeat doses of cardioplegia are administered at 90 minutes and every 30 minutes thereafter through coronary sinus catheter, and through subsequently placed vein grafts in order to maintain completely flat electrocardiogram and septal myocardial temperature below 15C.  Myocardial protection was felt to be excellent.   Coronary Artery Bypass Grafting:   The distal right coronary artery was grafted using a reversed saphenous vein graft in an end-to-side fashion.  At the site of distal anastomosis the target vessel was fair quality and measured approximately 1.5 mm in diameter.  There was diffuse plaque throughout and only a 1.0 probe would pass down the posterior descending coronary artery.  The obtuse marginal branch of the left circumflex coronary artery was grafted using a reversed saphenous vein graft in an end-to-side fashion.  At the site of distal  anastomosis the target vessel was good quality and measured approximately 1.8 mm in diameter.   Aortic Valve Replacement:  An oblique transverse aortotomy incision was performed.  The aortic valve was inspected and notable for intermediate type bicuspid aortic valve with partial fusion of the noncoronary and right coronary leaflets.  There was severe aortic stenosis.  The aortic valve leaflets were excised sharply and the aortic annulus decalcified.  Decalcification was notably straightforward.  The aortic annulus was sized to accept a 25 mm prosthesis.  The aortic root and left ventricle were irrigated with copious cold saline solution.  Aortic valve replacement was performed using interrupted horizontal mattress 2-0 Ethibond pledgeted sutures with pledgets in the subannular position.  An Edwards Inspiris Resilia stented bovine pericardial tissue valve (size 25 mm, ref # [id number], serial # [id number]) was implanted uneventfully. The valve seated appropriately with adequate space beneath the left main and right coronary artery.  The aortotomy was closed using a 2-layer closure of running 4-0 Prolene suture.   Procedure Completion:  All proximal vein graft anastomoses were placed directly to the ascending aorta prior to removal of the aortic cross clamp.  One final dose of warm retrograde "reanimation dose" cardioplegia was administered through the coronary sinus catheter while all air was evacuated through the aortic root.  The aortic cross clamp was removed after a total cross clamp time of 116 minutes.  All proximal and distal coronary anastomoses were inspected for hemostasis and appropriate graft orientation. Epicardial pacing wires are fixed to the inferior right ventricular freewall and to the right atrial appendage. The patient is rewarmed to 37C temperature. The aortic and left ventricular vents were removed.  The patient is weaned and disconnected from cardiopulmonary bypass.  The patient's  rhythm at separation from bypass was sinus.  The patient was weaned from cardiopulmonary bypass without any inotropic support. Total cardiopulmonary bypass time for the operation was 164 minutes.  Followup transesophageal echocardiogram performed after separation from bypass revealed a well-seated bioprosthetic tissue valve in the aortic position that was functioning normally.  There was no paravalvular leak.  There were otherwise no changes from the preoperative exam.  The aortic and venous cannula were removed uneventfully. Protamine was administered to reverse the anticoagulation. The mediastinum and pleural space were inspected for hemostasis and irrigated with saline solution.  There was moderate coagulopathy verified using thromboelastography.  Patient was transfused platelets, FFP, and cryoprecipitate.  Coagulopathy resolved.  The mediastinum was drained using 2 chest tubes placed through separate stab incisions inferiorly.  The soft tissues anterior to the aorta were reapproximated loosely. The sternum is closed with fibertape cerclage. The soft tissues anterior to the sternum were closed in multiple layers and the skin is closed with a running subcuticular skin closure.  During the procedure there was considerable bleeding from the patient's right thigh.  The counterincision in the middle of the right thigh was reexamined and suture ligatures applied prior to closure of all of the surgical wounds.  The post-bypass portion of the operation was notable for stable rhythm and hemodynamics.    Disposition:  The patient tolerated the procedure well and is transported to the surgical intensive care in stable condition. There are no intraoperative complications. All sponge instrument and needle counts are verified correct at completion of the operation.   Salvatore Decent. Cornelius Moras MD 08/24/2019 3:04 PM

## 2019-08-24 NOTE — Progress Notes (Signed)
T[facility] Evening Rounds  DOS from AVR/CABG Hemodynamically stable Minimal CT output Passing criteria for extubation Exam: unremarkable  A/p: extubate, continue early postoperative care. Antonio Riley Z. Vickey Sages, MD [phone]

## 2019-08-24 NOTE — Brief Op Note (Signed)
08/24/2019  1:15 PM  PATIENT:  Antonio Riley  54 y.o. male  PRE-OPERATIVE DIAGNOSIS:   Aortic Stenosis Coronary Artery Disease  POST-OPERATIVE DIAGNOSIS:  Aortic Stenosis Coronary Artery Disease  PROCEDURE:  Procedure(s): AORTIC VALVE REPLACEMENT (AVR) USING INSPIRIS VALVE SIZE (N/A) CORONARY ARTERY BYPASS GRAFTING (CABG) x two, using bilateral leg greater saphenous veins harvested endoscopically (N/A) TRANSESOPHAGEAL ECHOCARDIOGRAM (TEE) (N/A)  SURGEON:  Surgeon(s) and Role:    Purcell Nails, MD - Primary  PHYSICIAN ASSIST: WAYNE GOLD PA-C  ANESTHESIA:   general  EBL:  1404  BLOOD ADMINISTERED:none  DRAINS: mediastinal chest tubes x2   LOCAL MEDICATIONS USED:  NONE  SPECIMEN:  Source of Specimen:  AORTIC VALVE LEAFLETS  DISPOSITION OF SPECIMEN:  PATHOLOGY  COUNTS:  YES  TOURNIQUET:  * No tourniquets in log *  DICTATION: .Dragon Dictation  PLAN OF CARE: Admit to inpatient   PATIENT DISPOSITION:  ICU - intubated and hemodynamically stable.   Delay start of Pharmacological VTE agent (>24hrs) due to surgical blood loss or risk of bleeding: yes  COMPLICATIONS: NO KNOWN

## 2019-08-24 NOTE — Anesthesia Procedure Notes (Signed)
Procedure Name: Intubation Date/Time: 08/24/2019 8:03 AM Performed by: Clearnce Sorrel, CRNA Pre-anesthesia Checklist: Patient identified, Emergency Drugs available, Suction available, Patient being monitored and Timeout performed Patient Re-evaluated:Patient Re-evaluated prior to induction Oxygen Delivery Method: Circle system utilized Preoxygenation: Pre-oxygenation with 100% oxygen Induction Type: IV induction Ventilation: Mask ventilation without difficulty and Oral airway inserted - appropriate to patient size Laryngoscope Size: Mac, 4 and Glidescope Grade View: Grade III Tube type: Oral Tube size: 8.0 mm Number of attempts: 2 Airway Equipment and Method: Stylet Placement Confirmation: ETT inserted through vocal cords under direct vision,  positive ETCO2 and breath sounds checked- equal and bilateral Secured at: 24 cm Tube secured with: Tape Dental Injury: Teeth and Oropharynx as per pre-operative assessment

## 2019-08-24 NOTE — Anesthesia Procedure Notes (Signed)
Central Venous Catheter Insertion Performed by: Murvin Natal, MD, anesthesiologist Start/End4/01/2020 6:45 AM, 08/24/2019 7:00 AM Patient location: Pre-op. Preanesthetic checklist: patient identified, IV checked, site marked, risks and benefits discussed, surgical consent, monitors and equipment checked, pre-op evaluation, timeout performed and anesthesia consent Position: Trendelenburg Lidocaine 1% used for infiltration and patient sedated Hand hygiene performed , maximum sterile barriers used  and Seldinger technique used Catheter size: 9 Fr Total catheter length 12. PA cath was placed.MAC introducer Swan type:thermodilution PA Cath depth:58 Procedure performed using ultrasound guided technique. Ultrasound Notes:anatomy identified, needle tip was noted to be adjacent to the nerve/plexus identified and no ultrasound evidence of intravascular and/or intraneural injection Attempts: 1 Following insertion, line sutured, dressing applied and Biopatch. Post procedure assessment: blood return through all ports, free fluid flow and no air  Patient tolerated the procedure well with no immediate complications.

## 2019-08-25 ENCOUNTER — Inpatient Hospital Stay (HOSPITAL_COMMUNITY): Payer: 59

## 2019-08-25 LAB — CBC
HCT: 25.2 % — ABNORMAL LOW (ref 39.0–52.0)
HCT: 26.4 % — ABNORMAL LOW (ref 39.0–52.0)
Hemoglobin: 8.4 g/dL — ABNORMAL LOW (ref 13.0–17.0)
Hemoglobin: 9.1 g/dL — ABNORMAL LOW (ref 13.0–17.0)
MCH: 29.8 pg (ref 26.0–34.0)
MCH: 30.8 pg (ref 26.0–34.0)
MCHC: 33.3 g/dL (ref 30.0–36.0)
MCHC: 34.5 g/dL (ref 30.0–36.0)
MCV: 89.4 fL (ref 80.0–100.0)
MCV: 89.5 fL (ref 80.0–100.0)
Platelets: 101 10*3/uL — ABNORMAL LOW (ref 150–400)
Platelets: 97 10*3/uL — ABNORMAL LOW (ref 150–400)
RBC: 2.82 MIL/uL — ABNORMAL LOW (ref 4.22–5.81)
RBC: 2.95 MIL/uL — ABNORMAL LOW (ref 4.22–5.81)
RDW: 13.4 % (ref 11.5–15.5)
RDW: 13.3 % (ref 11.5–15.5)
WBC: 15.3 10*3/uL — ABNORMAL HIGH (ref 4.0–10.5)
WBC: 23.6 10*3/uL — ABNORMAL HIGH (ref 4.0–10.5)
nRBC: 0 % (ref 0.0–0.2)
nRBC: 0 % (ref 0.0–0.2)

## 2019-08-25 LAB — PREPARE RBC (CROSSMATCH)

## 2019-08-25 LAB — PREPARE FRESH FROZEN PLASMA: Unit division: 0

## 2019-08-25 LAB — BPAM FFP
Blood Product Expiration Date: 202104132359
Blood Product Expiration Date: 202104132359
ISSUE DATE / TIME: 202104091444
ISSUE DATE / TIME: 202104091444
Unit Type and Rh: 6200
Unit Type and Rh: 6200

## 2019-08-25 LAB — GLUCOSE, CAPILLARY
Glucose-Capillary: 130 mg/dL — ABNORMAL HIGH (ref 70–99)
Glucose-Capillary: 138 mg/dL — ABNORMAL HIGH (ref 70–99)
Glucose-Capillary: 141 mg/dL — ABNORMAL HIGH (ref 70–99)
Glucose-Capillary: 141 mg/dL — ABNORMAL HIGH (ref 70–99)
Glucose-Capillary: 143 mg/dL — ABNORMAL HIGH (ref 70–99)
Glucose-Capillary: 143 mg/dL — ABNORMAL HIGH (ref 70–99)
Glucose-Capillary: 147 mg/dL — ABNORMAL HIGH (ref 70–99)
Glucose-Capillary: 149 mg/dL — ABNORMAL HIGH (ref 70–99)
Glucose-Capillary: 150 mg/dL — ABNORMAL HIGH (ref 70–99)
Glucose-Capillary: 155 mg/dL — ABNORMAL HIGH (ref 70–99)
Glucose-Capillary: 157 mg/dL — ABNORMAL HIGH (ref 70–99)
Glucose-Capillary: 158 mg/dL — ABNORMAL HIGH (ref 70–99)

## 2019-08-25 LAB — BASIC METABOLIC PANEL
Anion gap: 7 (ref 5–15)
Anion gap: 8 (ref 5–15)
BUN: 15 mg/dL (ref 6–20)
BUN: 17 mg/dL (ref 6–20)
CO2: 23 mmol/L (ref 22–32)
CO2: 21 mmol/L — ABNORMAL LOW (ref 22–32)
Calcium: 7.5 mg/dL — ABNORMAL LOW (ref 8.9–10.3)
Calcium: 7.9 mg/dL — ABNORMAL LOW (ref 8.9–10.3)
Chloride: 105 mmol/L (ref 98–111)
Chloride: 102 mmol/L (ref 98–111)
Creatinine, Ser: 0.99 mg/dL (ref 0.61–1.24)
Creatinine, Ser: 0.99 mg/dL (ref 0.61–1.24)
GFR calc Af Amer: >60 mL/min (ref >60)
GFR calc Af Amer: >60 mL/min (ref >60)
GFR calc non Af Amer: >60 mL/min (ref >60)
GFR calc non Af Amer: >60 mL/min (ref >60)
Glucose, Bld: 150 mg/dL — ABNORMAL HIGH (ref 70–99)
Glucose, Bld: 156 mg/dL — ABNORMAL HIGH (ref 70–99)
Potassium: 4.3 mmol/L (ref 3.5–5.1)
Potassium: 4.2 mmol/L (ref 3.5–5.1)
Sodium: 135 mmol/L (ref 135–145)
Sodium: 131 mmol/L — ABNORMAL LOW (ref 135–145)

## 2019-08-25 LAB — BPAM CRYOPRECIPITATE
Blood Product Expiration Date: 202104092045
ISSUE DATE / TIME: 202104091504
Unit Type and Rh: 5100

## 2019-08-25 LAB — PREPARE PLATELET PHERESIS: Unit division: 0

## 2019-08-25 LAB — BPAM PLATELET PHERESIS
Blood Product Expiration Date: 202104112359
ISSUE DATE / TIME: 202104091445
Unit Type and Rh: 7300

## 2019-08-25 LAB — PREPARE CRYOPRECIPITATE: Unit division: 0

## 2019-08-25 LAB — MAGNESIUM
Magnesium: 2.3 mg/dL (ref 1.7–2.4)
Magnesium: 1.9 mg/dL (ref 1.7–2.4)

## 2019-08-25 MED ORDER — INSULIN ASPART 100 UNIT/ML ~~LOC~~ SOLN
0.0000 [IU] | SUBCUTANEOUS | Status: DC
  Administered 2019-08-25: 2 [IU] via SUBCUTANEOUS
  Administered 2019-08-26: 4 [IU] via SUBCUTANEOUS
  Administered 2019-08-26 – 2019-08-27 (×4): 2 [IU] via SUBCUTANEOUS

## 2019-08-25 MED ORDER — SODIUM CHLORIDE 0.9% IV SOLUTION: Freq: Once | INTRAVENOUS | Status: AC

## 2019-08-25 MED ORDER — CARVEDILOL 6.25 MG PO TABS
6.2500 mg | ORAL_TABLET | Freq: Two times a day (BID) | ORAL | Status: DC
  Administered 2019-08-25 (×2): 6.25 mg via ORAL
  Filled 2019-08-25 (×2): qty 1

## 2019-08-25 NOTE — Progress Notes (Signed)
1 Day Post-Op Procedure(s) (LRB): AORTIC VALVE REPLACEMENT (AVR) USING INSPIRIS VALVE SIZE (N/A) CORONARY ARTERY BYPASS GRAFTING (CABG) x two, using bilateral leg greater saphenous veins harvested endoscopically (N/A) TRANSESOPHAGEAL ECHOCARDIOGRAM (TEE) (N/A) Subjective: No complaints Objective: Vital signs in last 24 hours: Temp:  [95.4 F (35.2 C)-99.3 F (37.4 C)] 99 F (37.2 C) (04/10 0645) Pulse Rate:  [65-98] 89 (04/10 0645) Cardiac Rhythm: Normal sinus rhythm (04/10 0400) Resp:  [8-23] 19 (04/10 0645) BP: (105-140)/(75-95) 137/88 (04/10 0500) SpO2:  [94 %-100 %] 95 % (04/10 0645) Arterial Line BP: (95-161)/(52-88) 146/72 (04/10 0645) FiO2 (%):  [40 %-50 %] 40 % (04/09 1813) Weight:  [158.5 kg] 158.5 kg (04/10 0500)  Hemodynamic parameters for last 24 hours: PAP: (21-38)/(10-29) 22/16 CO:  [4.4 L/min-7.9 L/min] 7.9 L/min CI:  [1.7 L/min/m2-3 L/min/m2] 3 L/min/m2  Intake/Output from previous day: 04/09 0701 - 04/10 0700 In: 8484.4 [P.O.:60; I.V.:4953.6; Blood:2033; IV Piggyback:1437.8] Out: 7527 [Urine:4515; Blood:2402; Chest Tube:610] Intake/Output this shift: No intake/output data recorded.  General appearance: alert and cooperative Neurologic: intact Heart: regular rate and rhythm, S1, S2 normal, no murmur, click, rub or gallop Lungs: clear to auscultation bilaterally Abdomen: soft, non-tender; bowel sounds normal; no masses,  no organomegaly Extremities: extremities normal, atraumatic, no cyanosis or edema Wound: dressed  Lab Results: Recent Labs    08/24/19 1940 08/24/19 1940 08/24/19 1957 08/25/19 0447  WBC 20.8*  --   --  15.3*  HGB 9.3*   < > 8.8* 8.4*  HCT 28.1*   < > 26.0* 25.2*  PLT 105*  --   --  101*   < > = values in this interval not displayed.   BMET:  Recent Labs    08/24/19 1940 08/24/19 1940 08/24/19 1957 08/25/19 0447  NA 136   < > 137 135  K 4.8   < > 4.8 4.3  CL 105  --   --  105  CO2 23  --   --  23  GLUCOSE 157*  --    --  150*  BUN 13  --   --  15  CREATININE 0.96  --   --  0.99  CALCIUM 7.3*  --   --  7.5*   < > = values in this interval not displayed.    PT/INR:  Recent Labs    08/24/19 1551  LABPROT 20.9*  INR 1.8*   ABG    Component Value Date/Time   PHART 7.344 (L) 08/24/2019 1957   HCO3 24.0 08/24/2019 1957   TCO2 25 08/24/2019 1957   ACIDBASEDEF 2.0 08/24/2019 1957   O2SAT 96.0 08/24/2019 1957   CBG (last 3)  Recent Labs    08/25/19 0250 08/25/19 0404 08/25/19 0548  GLUCAP 147* 143* 157*    Assessment/Plan: S/P Procedure(s) (LRB): AORTIC VALVE REPLACEMENT (AVR) USING INSPIRIS VALVE SIZE (N/A) CORONARY ARTERY BYPASS GRAFTING (CABG) x two, using bilateral leg greater saphenous veins harvested endoscopically (N/A) TRANSESOPHAGEAL ECHOCARDIOGRAM (TEE) (N/A) Mobilize remove PA catheter  txfuse one unit prbc Beta blocker   LOS: 1 day    Linden Dolin 08/25/2019

## 2019-08-25 NOTE — Hospital Course (Addendum)
Patient underwent uncomplicated aortic valve replacement and CABG x2 using a 25 mm Edwards Inspiris Resilia stented bovine pericardial tissue valve with SVG's to distal RCA and OM branch of LCx on 08/24/2019.  Intraoperative findings were notable for normal LV systolic function.   The patient was extubated using standard protocols without difficulty.  He does have an expected acute blood loss anemia and on postop day 1 he was transfused 1 unit of packed cells.  Additionally the Swan-Ganz was removed on postoperative day #1.  He was started on a low-dose beta-blocker as well.  On postop day 2 he developed atrial fibrillation and was started on an amiodarone drip.  He did have some elevated systolic blood pressure readings and was started back on lisinopril at a low dose.  Both beta-blocker and ACE inhibitor were resumed at preop dose on postoperative day #3.  He does have some moderate postoperative volume overload and peripheral edema.  He has been put on a course of intravenous diuretics and is responding well with excellent urine output. Renal function has remained within normal limits.  Blood sugars have been under good control and he has no specific history of diabetes, being on no medications preoperatively as well.  His hemoglobin A1c preoperatively was 6.0. Chest x-ray on postoperative day #3 shows only mild atelectasis with mostly clear lung fields.  He is progressing well in his cardiac rehab and ambulation.  He continues to use incentive spirometry in a routine manner.On POD # 4 he has continued to make good progress and is maintaining sinus rhythm. He continues to have volume overload but is responding well to IV lasix. This will be transitioned to daily po dosing at discharge in addition to home spironolactone dosing. His Crestor will be increased to 20 mg at discharge for higher intensity dosing post CABG. oxygen has been weaned and he maintains good saturations on room air.  Incisions are noted to be  healing well without evidence of infection.  He does have moderate ecchymosis in bilateral thighs but no evidence of hematoma.  He is tolerating gradually increasing activity using standard cardiac rehab protocols.  We will arrange for a walker at discharge due to his degree of postoperative deconditioning for safety.  At the time of discharge the patient is felt to be quite stable.

## 2019-08-26 ENCOUNTER — Inpatient Hospital Stay (HOSPITAL_COMMUNITY): Payer: 59

## 2019-08-26 LAB — BASIC METABOLIC PANEL
Anion gap: 8 (ref 5–15)
Anion gap: 10 (ref 5–15)
BUN: 17 mg/dL (ref 6–20)
BUN: 16 mg/dL (ref 6–20)
CO2: 24 mmol/L (ref 22–32)
CO2: 22 mmol/L (ref 22–32)
Calcium: 8 mg/dL — ABNORMAL LOW (ref 8.9–10.3)
Calcium: 8.1 mg/dL — ABNORMAL LOW (ref 8.9–10.3)
Chloride: 100 mmol/L (ref 98–111)
Chloride: 99 mmol/L (ref 98–111)
Creatinine, Ser: 0.89 mg/dL (ref 0.61–1.24)
Creatinine, Ser: 0.96 mg/dL (ref 0.61–1.24)
GFR calc Af Amer: >60 mL/min (ref >60)
GFR calc Af Amer: >60 mL/min (ref >60)
GFR calc non Af Amer: >60 mL/min (ref >60)
GFR calc non Af Amer: >60 mL/min (ref >60)
Glucose, Bld: 151 mg/dL — ABNORMAL HIGH (ref 70–99)
Glucose, Bld: 131 mg/dL — ABNORMAL HIGH (ref 70–99)
Potassium: 4.5 mmol/L (ref 3.5–5.1)
Potassium: 3.7 mmol/L (ref 3.5–5.1)
Sodium: 132 mmol/L — ABNORMAL LOW (ref 135–145)
Sodium: 131 mmol/L — ABNORMAL LOW (ref 135–145)

## 2019-08-26 LAB — BPAM RBC
Blood Product Expiration Date: 202105082359
Blood Product Expiration Date: 202105132359
Blood Product Expiration Date: 202105132359
Blood Product Expiration Date: 202105132359
ISSUE DATE / TIME: 202104091428
ISSUE DATE / TIME: 202104091428
ISSUE DATE / TIME: 202104091606
ISSUE DATE / TIME: 202104100852
Unit Type and Rh: 5100
Unit Type and Rh: 5100
Unit Type and Rh: 5100
Unit Type and Rh: 5100

## 2019-08-26 LAB — TYPE AND SCREEN
ABO/RH(D): O POS
Antibody Screen: NEGATIVE
Unit division: 0
Unit division: 0
Unit division: 0
Unit division: 0

## 2019-08-26 LAB — GLUCOSE, CAPILLARY
Glucose-Capillary: 107 mg/dL — ABNORMAL HIGH (ref 70–99)
Glucose-Capillary: 108 mg/dL — ABNORMAL HIGH (ref 70–99)
Glucose-Capillary: 120 mg/dL — ABNORMAL HIGH (ref 70–99)
Glucose-Capillary: 132 mg/dL — ABNORMAL HIGH (ref 70–99)
Glucose-Capillary: 147 mg/dL — ABNORMAL HIGH (ref 70–99)
Glucose-Capillary: 154 mg/dL — ABNORMAL HIGH (ref 70–99)
Glucose-Capillary: 181 mg/dL — ABNORMAL HIGH (ref 70–99)

## 2019-08-26 LAB — CBC
HCT: 24.5 % — ABNORMAL LOW (ref 39.0–52.0)
Hemoglobin: 8.3 g/dL — ABNORMAL LOW (ref 13.0–17.0)
MCH: 30.3 pg (ref 26.0–34.0)
MCHC: 33.9 g/dL (ref 30.0–36.0)
MCV: 89.4 fL (ref 80.0–100.0)
Platelets: 87 10*3/uL — ABNORMAL LOW (ref 150–400)
RBC: 2.74 MIL/uL — ABNORMAL LOW (ref 4.22–5.81)
RDW: 13.3 % (ref 11.5–15.5)
WBC: 23.2 10*3/uL — ABNORMAL HIGH (ref 4.0–10.5)
nRBC: 0 % (ref 0.0–0.2)

## 2019-08-26 MED ORDER — AMIODARONE HCL IN DEXTROSE 360-4.14 MG/200ML-% IV SOLN
60.0000 mg/h | INTRAVENOUS | Status: AC
  Administered 2019-08-26 (×2): 60 mg/h via INTRAVENOUS
  Filled 2019-08-26 (×2): qty 200

## 2019-08-26 MED ORDER — AMIODARONE LOAD VIA INFUSION
150.0000 mg | Freq: Once | INTRAVENOUS | Status: AC
  Administered 2019-08-26: 150 mg via INTRAVENOUS
  Filled 2019-08-26: qty 83.34

## 2019-08-26 MED ORDER — FUROSEMIDE 10 MG/ML IJ SOLN
40.0000 mg | Freq: Two times a day (BID) | INTRAMUSCULAR | Status: DC
  Administered 2019-08-26 – 2019-08-29 (×7): 40 mg via INTRAVENOUS
  Filled 2019-08-26 (×7): qty 4

## 2019-08-26 MED ORDER — COLCHICINE 0.3 MG HALF TABLET
0.3000 mg | ORAL_TABLET | Freq: Two times a day (BID) | ORAL | Status: DC
  Administered 2019-08-26 (×2): 0.3 mg via ORAL
  Filled 2019-08-26 (×3): qty 1

## 2019-08-26 MED ORDER — AMIODARONE HCL IN DEXTROSE 360-4.14 MG/200ML-% IV SOLN
30.0000 mg/h | INTRAVENOUS | Status: AC
  Administered 2019-08-26 – 2019-08-27 (×2): 30 mg/h via INTRAVENOUS
  Filled 2019-08-26: qty 200

## 2019-08-26 MED ORDER — CARVEDILOL 12.5 MG PO TABS
12.5000 mg | ORAL_TABLET | Freq: Two times a day (BID) | ORAL | Status: DC
  Administered 2019-08-26 – 2019-08-27 (×3): 12.5 mg via ORAL
  Filled 2019-08-26 (×3): qty 1

## 2019-08-26 MED ORDER — LISINOPRIL 5 MG PO TABS
5.0000 mg | ORAL_TABLET | Freq: Every day | ORAL | Status: DC
  Administered 2019-08-26: 5 mg via ORAL
  Filled 2019-08-26: qty 1

## 2019-08-26 NOTE — Progress Notes (Signed)
2 Days Post-Op Procedure(s) (LRB): AORTIC VALVE REPLACEMENT (AVR) USING INSPIRIS VALVE SIZE (N/A) CORONARY ARTERY BYPASS GRAFTING (CABG) x two, using bilateral leg greater saphenous veins harvested endoscopically (N/A) TRANSESOPHAGEAL ECHOCARDIOGRAM (TEE) (N/A) Subjective: No c/o  Objective: Vital signs in last 24 hours: Temp:  [97.9 F (36.6 C)-99.1 F (37.3 C)] 97.9 F (36.6 C) (04/11 0736) Pulse Rate:  [79-96] 79 (04/11 0700) Cardiac Rhythm: Normal sinus rhythm (04/11 0400) Resp:  [12-22] 16 (04/11 0700) BP: (112-152)/(69-92) 133/82 (04/11 0700) SpO2:  [92 %-99 %] 97 % (04/11 0700) Arterial Line BP: (123-306)/(64-294) 157/74 (04/11 0700) Weight:  [158.9 kg] 158.9 kg (04/11 0500)  Hemodynamic parameters for last 24 hours: PAP: (18-24)/(9-15) 19/12  Intake/Output from previous day: 04/10 0701 - 04/11 0700 In: 1224.7 [P.O.:60; I.V.:749.7; Blood:315; IV Piggyback:100] Out: 2235 [Urine:1895; Chest Tube:340] Intake/Output this shift: No intake/output data recorded.  General appearance: alert and cooperative Neurologic: intact Heart: regular rate and rhythm, S1, S2 normal, no murmur, click, rub or gallop Lungs: clear to auscultation bilaterally Abdomen: soft, non-tender; bowel sounds normal; no masses,  no organomegaly Extremities: extremities normal, atraumatic, no cyanosis or edema Wound: c/d/i  Lab Results: Recent Labs    08/25/19 1754 08/26/19 0348  WBC 23.6* 23.2*  HGB 9.1* 8.3*  HCT 26.4* 24.5*  PLT 97* 87*   BMET:  Recent Labs    08/25/19 1754 08/26/19 0348  NA 131* 132*  K 4.2 4.5  CL 102 100  CO2 21* 24  GLUCOSE 156* 151*  BUN 17 17  CREATININE 0.99 0.89  CALCIUM 7.9* 8.0*    PT/INR:  Recent Labs    08/24/19 1551  LABPROT 20.9*  INR 1.8*   ABG    Component Value Date/Time   PHART 7.344 (L) 08/24/2019 1957   HCO3 24.0 08/24/2019 1957   TCO2 25 08/24/2019 1957   ACIDBASEDEF 2.0 08/24/2019 1957   O2SAT 96.0 08/24/2019 1957   CBG  (last 3)  Recent Labs    08/26/19 0318 08/26/19 0628 08/26/19 0735  GLUCAP 132* 147* 181*    Assessment/Plan: S/P Procedure(s) (LRB): AORTIC VALVE REPLACEMENT (AVR) USING INSPIRIS VALVE SIZE (N/A) CORONARY ARTERY BYPASS GRAFTING (CABG) x two, using bilateral leg greater saphenous veins harvested endoscopically (N/A) TRANSESOPHAGEAL ECHOCARDIOGRAM (TEE) (N/A) Mobilize Diuresis amiodarone load  Remove chest tubes Increase beta blocker and add lisinopril   LOS: 2 days    Antonio Riley 08/26/2019

## 2019-08-27 ENCOUNTER — Inpatient Hospital Stay (HOSPITAL_COMMUNITY): Payer: 59

## 2019-08-27 LAB — CBC
HCT: 25 % — ABNORMAL LOW (ref 39.0–52.0)
Hemoglobin: 8.4 g/dL — ABNORMAL LOW (ref 13.0–17.0)
MCH: 30.9 pg (ref 26.0–34.0)
MCHC: 33.6 g/dL (ref 30.0–36.0)
MCV: 91.9 fL (ref 80.0–100.0)
Platelets: 99 10*3/uL — ABNORMAL LOW (ref 150–400)
RBC: 2.72 MIL/uL — ABNORMAL LOW (ref 4.22–5.81)
RDW: 13.3 % (ref 11.5–15.5)
WBC: 15.2 10*3/uL — ABNORMAL HIGH (ref 4.0–10.5)
nRBC: 0 % (ref 0.0–0.2)

## 2019-08-27 LAB — BASIC METABOLIC PANEL
Anion gap: 9 (ref 5–15)
BUN: 15 mg/dL (ref 6–20)
CO2: 28 mmol/L (ref 22–32)
Calcium: 8.1 mg/dL — ABNORMAL LOW (ref 8.9–10.3)
Chloride: 98 mmol/L (ref 98–111)
Creatinine, Ser: 0.92 mg/dL (ref 0.61–1.24)
GFR calc Af Amer: >60 mL/min (ref >60)
GFR calc non Af Amer: >60 mL/min (ref >60)
Glucose, Bld: 115 mg/dL — ABNORMAL HIGH (ref 70–99)
Potassium: 3.5 mmol/L (ref 3.5–5.1)
Sodium: 135 mmol/L (ref 135–145)

## 2019-08-27 LAB — GLUCOSE, CAPILLARY
Glucose-Capillary: 121 mg/dL — ABNORMAL HIGH (ref 70–99)
Glucose-Capillary: 164 mg/dL — ABNORMAL HIGH (ref 70–99)

## 2019-08-27 LAB — SURGICAL PATHOLOGY

## 2019-08-27 MED ORDER — LISINOPRIL 10 MG PO TABS
20.0000 mg | ORAL_TABLET | Freq: Every day | ORAL | Status: DC
  Administered 2019-08-27 – 2019-08-29 (×3): 20 mg via ORAL
  Filled 2019-08-27 (×2): qty 2
  Filled 2019-08-27: qty 1

## 2019-08-27 MED ORDER — CARVEDILOL 25 MG PO TABS
25.0000 mg | ORAL_TABLET | Freq: Two times a day (BID) | ORAL | Status: DC
  Administered 2019-08-27 – 2019-08-29 (×5): 25 mg via ORAL
  Filled 2019-08-27 (×5): qty 1

## 2019-08-27 MED ORDER — POTASSIUM CHLORIDE CRYS ER 20 MEQ PO TBCR
20.0000 meq | EXTENDED_RELEASE_TABLET | ORAL | Status: DC
  Administered 2019-08-27: 20 meq via ORAL
  Filled 2019-08-27: qty 1

## 2019-08-27 MED ORDER — AMIODARONE HCL 200 MG PO TABS
400.0000 mg | ORAL_TABLET | Freq: Two times a day (BID) | ORAL | Status: DC
  Administered 2019-08-27 – 2019-08-28 (×3): 400 mg via ORAL
  Filled 2019-08-27 (×3): qty 2

## 2019-08-27 MED ORDER — POTASSIUM CHLORIDE 10 MEQ/50ML IV SOLN
10.0000 meq | INTRAVENOUS | Status: AC
  Administered 2019-08-27 (×2): 10 meq via INTRAVENOUS

## 2019-08-27 MED ORDER — POTASSIUM CHLORIDE 10 MEQ/50ML IV SOLN: 10.0000 meq | INTRAVENOUS | Status: DC

## 2019-08-27 MED ORDER — ~~LOC~~ CARDIAC SURGERY, PATIENT & FAMILY EDUCATION: Freq: Once | Status: AC

## 2019-08-27 MED ORDER — POTASSIUM CHLORIDE CRYS ER 20 MEQ PO TBCR
40.0000 meq | EXTENDED_RELEASE_TABLET | ORAL | Status: AC
  Administered 2019-08-27 (×2): 40 meq via ORAL
  Filled 2019-08-27 (×2): qty 2

## 2019-08-27 MED ORDER — LISINOPRIL 10 MG PO TABS: 10.0000 mg | ORAL_TABLET | Freq: Every day | ORAL | Status: DC

## 2019-08-27 MED ORDER — SPIRONOLACTONE 25 MG PO TABS
25.0000 mg | ORAL_TABLET | Freq: Every day | ORAL | Status: DC
  Administered 2019-08-27 – 2019-08-29 (×3): 25 mg via ORAL
  Filled 2019-08-27 (×3): qty 1

## 2019-08-27 MED ORDER — MAGNESIUM OXIDE 400 (241.3 MG) MG PO TABS
400.0000 mg | ORAL_TABLET | Freq: Two times a day (BID) | ORAL | Status: DC
  Administered 2019-08-27 – 2019-08-29 (×5): 400 mg via ORAL
  Filled 2019-08-27 (×5): qty 1

## 2019-08-27 MED FILL — Lidocaine HCl Local Preservative Free (PF) Inj 2%: INTRAMUSCULAR | Qty: 15 | Status: AC

## 2019-08-27 MED FILL — Heparin Sodium (Porcine) Inj 1000 Unit/ML: INTRAMUSCULAR | Qty: 30 | Status: AC

## 2019-08-27 MED FILL — Potassium Chloride Inj 2 mEq/ML: INTRAVENOUS | Qty: 40 | Status: AC

## 2019-08-27 NOTE — Discharge Instructions (Signed)
Coronary Artery Bypass Grafting, Care After This sheet gives you information about how to care for yourself after your procedure. Your doctor may also give you more specific instructions. If you have problems or questions, call your doctor. What can I expect after the procedure? After the procedure, it is common to:  Feel sick to your stomach (nauseous).  Not want to eat as much as normal (lack of appetite).  Have trouble pooping (constipation).  Have weakness and tiredness (fatigue).  Feel sad (depressed) or grouchy (irritable).  Have pain or discomfort around the cuts from surgery (incisions). Follow these instructions at home: Medicines  Take over-the-counter and prescription medicines only as told by your doctor. Do not stop taking medicines or start any new medicines unless your doctor says it is okay.  If you were prescribed an antibiotic medicine, take it as told by your doctor. Do not stop taking the antibiotic even if you start to feel better. Incision care   Follow instructions from your doctor about how to take care of your cuts from surgery. Make sure you: ? Wash your hands with soap and water before and after you change your bandage (dressing). If you cannot use soap and water, use hand sanitizer. ? Change your bandage as told by your doctor. ? Leave stitches (sutures), skin glue, or skin tape (adhesive) strips in place. They may need to stay in place for 2 weeks or longer. If tape strips get loose and curl up, you may trim the loose edges. Do not remove tape strips completely unless your doctor says it is okay.  Make sure the surgery cuts are clean, dry, and protected.  Check your cut areas every day for signs of infection. Check for: ? More redness, swelling, or pain. ? More fluid or blood. ? Warmth. ? Pus or a bad smell.  If cuts were made in your legs: ? Avoid crossing your legs. ? Avoid sitting for long periods of time. Change positions every 30  minutes. ? Raise (elevate) your legs when you are sitting. Bathing  Do not take baths, swim, or use a hot tub until your doctor says it is okay.  You may take a shower. Pat the surgery cuts dry. Do not rub the cuts to dry.  Eating and drinking   Eat foods that are high in fiber, such as beans, nuts, whole grains, and raw fruits and vegetables. Any meats you eat should be lean cut. Avoid canned, processed, and fried foods. This can help prevent trouble pooping. This is also a part of a heart-healthy diet.  Drink enough fluid to keep your pee (urine) pale yellow.  Do not drink alcohol until you are fully recovered. Ask your doctor when it is safe to drink alcohol. Activity  Rest and limit your activity as told by your doctor. You may be told to: ? Stop any activity right away if you have chest pain, shortness of breath, irregular heartbeats, or dizziness. Get help right away if you have any of these symptoms. ? Move around often for short periods or take short walks as told by your doctor. Slowly increase your activities. ? Avoid lifting, pushing, or pulling anything that is heavier than 10 lb (4.5 kg) for at least 6 weeks or as told by your doctor.  Do physical therapy or a cardiac rehab (cardiac rehabilitation) program as told by your doctor. ? Physical therapy involves doing exercises to maintain movement and build strength and endurance. ? A cardiac rehab program includes:  Exercise training. °§ Education. °§ Counseling. °· Do not drive until your doctor says it is okay. °· Ask your doctor when you can go back to work. °· Ask your doctor when you can be sexually active. °General instructions °· Do not drive or use heavy machinery while taking prescription pain medicine. °· Do not use any products that contain nicotine or tobacco. These include cigarettes, e-cigarettes, and chewing tobacco. If you need help quitting, ask your doctor. °· Take 2-3 deep breaths every few hours during the day  while you get better. This helps expand your lungs and prevent problems. °· If you were given a device called an incentive spirometer, use it several times a day to practice deep breathing. Support your chest with a pillow or your arms when you take deep breaths or cough. °· Wear compression stockings as told by your doctor. °· Weigh yourself every day. This helps to see if your body is holding (retaining) fluid that may make your heart and lungs work harder. °· Keep all follow-up visits as told by your doctor. This is important. °Contact a doctor if: °· You have more redness, swelling, or pain around any cut. °· You have more fluid or blood coming from any cut. °· Any cut feels warm to the touch. °· You have pus or a bad smell coming from any cut. °· You have a fever. °· You have swelling in your ankles or legs. °· You have pain in your legs. °· You gain 2 lb (0.9 kg) or more a day. °· You feel sick to your stomach or you throw up (vomit). °· You have watery poop (diarrhea). °Get help right away if: °· You have chest pain that goes to your jaw or arms. °· You are short of breath. °· You have a fast or irregular heartbeat. °· You notice a "clicking" in your breastbone (sternum) when you move. °· You have any signs of a stroke. "BE FAST" is an easy way to remember the main warning signs: °? B - Balance. Signs are dizziness, sudden trouble walking, or loss of balance. °? E - Eyes. Signs are trouble seeing or a change in how you see. °? F - Face. Signs are sudden weakness or loss of feeling of the face, or the face or eyelid drooping on one side. °? A - Arms. Signs are weakness or loss of feeling in an arm. This happens suddenly and usually on one side of the body. °? S - Speech. Signs are sudden trouble speaking, slurred speech, or trouble understanding what people say. °? T - Time. Time to call emergency services. Write down what time symptoms started. °· You have other signs of a stroke, such as: °? A sudden, very  bad headache with no known cause. °? Feeling sick to your stomach. °? Throwing up. °? Jerky movements you cannot control (seizure). °These symptoms may be an emergency. Do not wait to see if the symptoms will go away. Get medical help right away. Call your local emergency services (911 in the [place] Do not drive yourself to the hospital. °Summary °· After the procedure, it is common to have pain or discomfort in the cuts from surgery (incisions). °· Do not take baths, swim, or use a hot tub until your doctor says it is okay. °· Slowly increase your activities. You may need physical therapy or cardiac rehab. °· Weigh yourself every day. This helps to see if your body is holding fluid. °This information is not   intended to replace advice given to you by your health care provider. Make sure you discuss any questions you have with your health care provider. Document Revised: 01/10/2018 Document Reviewed: 01/10/2018 Elsevier Patient Education  2020 [employer] Endoscopic Saphenous Vein Harvesting, Care After This sheet gives you information about how to care for yourself after your procedure. Your health care provider may also give you more specific instructions. If you have problems or questions, contact your health care provider. What can I expect after the procedure? After the procedure, it is common to have:  Pain.  Bruising.  Swelling.  Numbness. Follow these instructions at home: Incision care   Follow instructions from your health care provider about how to take care of your incisions. Make sure you: ? Wash your hands with soap and water before and after you change your bandages (dressings). If soap and water are not available, use hand sanitizer. ? Change your dressings as told by your health care provider. ? Leave stitches (sutures), skin glue, or adhesive strips in place. These skin closures may need to stay in place for 2 weeks or longer. If adhesive strip edges start to loosen and curl  up, you may trim the loose edges. Do not remove adhesive strips completely unless your health care provider tells you to do that.  Check your incision areas every day for signs of infection. Check for: ? More redness, swelling, or pain. ? Fluid or blood. ? Warmth. ? Pus or a bad smell. Medicines  Take over-the-counter and prescription medicines only as told by your health care provider.  Ask your health care provider if the medicine prescribed to you requires you to avoid driving or using heavy machinery. General instructions  Raise (elevate) your legs above the level of your heart while you are sitting or lying down.  Avoid crossing your legs.  Avoid sitting for long periods of time. Change positions every 30 minutes.  Do any exercises your health care providers have given you. These may include deep breathing, coughing, and walking exercises.  Do not take baths, swim, or use a hot tub until your health care provider approves. Ask your health care provider if you may take showers. You may only be allowed to take sponge baths.  Wear compression stockings as told by your health care provider. These stockings help to prevent blood clots and reduce swelling in your legs.  Keep all follow-up visits as told by your health care provider. This is important. Contact a health care provider if:  Medicine does not help your pain.  Your pain gets worse.  You have new leg bruises or your leg bruises get bigger.  Your leg feels numb.  You have more redness, swelling, or pain around your incision.  You have fluid or blood coming from your incision.  Your incision feels warm to the touch.  You have pus or a bad smell coming from your incision.  You have a fever. Get help right away if:  Your pain is severe.  You develop pain, tenderness, warmth, redness, or swelling in any part of your leg.  You have chest pain.  You have trouble breathing. Summary  Raise (elevate) your legs  above the level of your heart while you are sitting or lying down.  Wear compression stockings as told by your health care provider.  Make sure you know which symptoms should prompt you to contact your health care provider.  Keep all follow-up visits as told by your health care provider.  This information is not intended to replace advice given to you by your health care provider. Make sure you discuss any questions you have with your health care provider. Document Revised: 04/10/2018 Document Reviewed: 04/10/2018 Elsevier Patient Education  2020 [employer] Surgical Aortic Valve Replacement, Care After This sheet gives you information about how to care for yourself after your procedure. Your health care provider may also give you more specific instructions. If you have problems or questions, contact your health care provider. What can I expect after the procedure? After the procedure, it is common to have pain around your incision area. Follow these instructions at home: Medicines  Take over-the-counter and prescription medicines only as told by your health care provider.  If you were prescribed an antibiotic medicine, take it as told by your health care provider. Do not stop taking the antibiotic even if you start to feel better.  If you have a mechanical prosthesis, you may be given a blood thinner called warfarin. Follow instructions carefully on how to take this medicine.  Ask your health care provider if the medicine prescribed to you: ? Requires you to avoid driving or using heavy machinery. ? Can cause constipation. You may need to take actions to prevent or treat constipation, such as:  Take over-the-counter or prescription medicines.  Eat foods that are high in fiber, such as beans, whole grains, and fresh fruits and vegetables.  Limit foods that are high in fat and processed sugars, such as fried or sweet foods. Eating and drinking      Limit how much caffeine you  drink. Caffeine can affect your heart's rate and rhythm.  Do not drink alcohol if: ? Your health care provider tells you not to drink. ? You are pregnant, may be pregnant, or are planning to become pregnant.  Drink enough fluid to keep your urine pale yellow.  Eat a heart-healthy diet that includes fruits, vegetables, whole grains, low-fat dairy products, and lean proteins like poultry and eggs. Incision care   Follow instructions from your health care provider about how to take care of your incision. Make sure you: ? Wash your hands with soap and water before and after you change your bandage (dressing). If soap and water are not available, use hand sanitizer. ? Change your dressing as told by your health care provider. ? Leave stitches (sutures), skin glue, or adhesive strips in place. These skin closures may need to stay in place for 2 weeks or longer. If adhesive strip edges start to loosen and curl up, you may trim the loose edges. Do not remove adhesive strips completely unless your health care provider tells you to do that.  Check your incision area every day for signs of infection. Check for: ? Redness, swelling, or increasing pain. ? Fluid or blood. ? Warmth. ? Pus or a bad smell. Activity  Return to your normal activities as told by your health care provider. Most patients will need to limit any lifting or strenuous activity for 4-6 weeks.  Avoid sitting for a long time without moving. Get up to take short walks every 1-2 hours.  Do exercises as told by your health care provider.  Do not lift anything that is heavier than 10 lb (4.5 kg), or the limit that you are told, until your health care provider says that it is safe.  Avoid pushing or pulling things with your arms until your health care provider approves. This includes pulling on handrails to help you climb stairs.  Lifestyle  Do not use any products that contain nicotine or tobacco, such as cigarettes, e-cigarettes,  and chewing tobacco. These can delay incision healing after surgery. If you need help quitting, ask your health care provider.  Resume sexual activity as told by your health care provider. If you have erectile dysfunction, do not use medicines to treat this condition unless your health care provider approves.  Work with your health care provider to: ? Keep your blood pressure and cholesterol under control. ? Manage any other heart conditions that you have. ? Maintain a healthy weight. Driving and travel  Do not drive until your health care provider approves. Ask your health care provider when it is safe for you to drive.  Avoid airplane travel for as long as told by your health care provider.  When you travel, bring a list of your medicines and a record of your medical history with you. Carry your medicines with you. General instructions  Do not take baths, swim, or use a hot tub until your health care provider approves. Ask your health care provider if you may take showers. You may only be allowed to take sponge baths.  Do not strain to have a bowel movement.  Avoid crossing your legs while sitting down.  Check your temperature every day for a fever. A fever may be a sign of infection.  If you are a woman and you plan to become pregnant, talk with your health care provider before you become pregnant.  Wear compression stockings as told by your health care provider. These stockings help to prevent blood clots and reduce swelling in your legs.  Tell all health care providers who care for you that you have an artificial (prosthetic) aortic valve. Also, tell them if you have or have had heart disease or endocarditis.  You will be given a card at discharge. The card indicates the type of prosthetic valve that you have. Keep this card in your wallet or purse for quick reference in case of an emergency.  Keep all follow-up visits as told by your health care provider. This is  important. Contact a health care provider if:  You develop a skin rash.  You experience sudden, unexplained changes in your weight.  You have redness, swelling, or increasing pain around your incision.  You have fluid or blood coming from your incision.  Your incision feels warm to the touch.  You have pus or a bad smell coming from your incision.  You have a fever. Get help right away if you:  Develop chest pain that is different from the pain coming from your incision.  Develop shortness of breath or difficulty breathing.  Start to feel light-headed. These symptoms may represent a serious problem that is an emergency. Do not wait to see if the symptoms will go away. Get medical help right away. Call your local emergency services (911 in the [place] Do not drive yourself to the hospital. Summary  After this procedure, it is common to have pain in the incision area.  Eat a heart-healthy diet. Follow instructions about alcohol use.  Get up to walk often. Avoid pushing and pulling with your arms. Ask what activities are safe for you.  Care for your incision as told by your health care provider. Check it daily for signs of infection.  Get help right away if you have chest pain that is different from your incisions, develop shortness of breath or difficulty breathing, or start to feel light-headed. This information  is not intended to replace advice given to you by your health care provider. Make sure you discuss any questions you have with your health care provider. Document Revised: 01/26/2018 Document Reviewed: 01/26/2018 Elsevier Patient Education  2020 [employer].

## 2019-08-27 NOTE — Progress Notes (Addendum)
[address]       [place] [address]             [phone]      3 Days Post-Op Procedure(s) (LRB): AORTIC VALVE REPLACEMENT (AVR) USING INSPIRIS VALVE SIZE 25MM (N/A) CORONARY ARTERY BYPASS GRAFTING (CABG) x two, using bilateral leg greater saphenous veins harvested endoscopically (N/A) TRANSESOPHAGEAL ECHOCARDIOGRAM (TEE) (N/A) Subjective: Feeling pretty well today, took a good walk this am . Afib on amio gtt- currently sinus with PAC's, PVC's  Objective: Vital signs in last 24 hours: Temp:  [97.6 F (36.4 C)-98.6 F (37 C)] 98.3 F (36.8 C) (04/12 0400) Pulse Rate:  [50-161] 82 (04/12 0700) Cardiac Rhythm: Normal sinus rhythm (04/12 0400) Resp:  [14-21] 20 (04/12 0700) BP: (109-151)/(60-85) 133/78 (04/12 0700) SpO2:  [92 %-100 %] 97 % (04/12 0700) Arterial Line BP: (150)/(68) 150/68 (04/11 0800) Weight:  [157 kg] 157 kg (04/12 0500)  Hemodynamic parameters for last 24 hours:    Intake/Output from previous day: 04/11 0701 - 04/12 0700 In: 1205.7 [P.O.:480; I.V.:725.7] Out: 4000 [Urine:3950; Chest Tube:50] Intake/Output this shift: No intake/output data recorded.  General appearance: alert, cooperative and no distress Heart: regular rate and rhythm and soft systolic Ao murmur Lungs: clear to auscultation bilaterally Abdomen: soft, non-tender or distended Extremities: + edema Wound: incis healing well, minor serosang drainage right mid thigh incis with bilat thigh echymosis  Lab Results: Recent Labs    08/26/19 0348 08/27/19 0419  WBC 23.2* 15.2*  HGB 8.3* 8.4*  HCT 24.5* 25.0*  PLT 87* 99*   BMET:  Recent Labs    08/26/19 1424 08/27/19 0419  NA 131* 135  K 3.7 3.5  CL 99 98  CO2 22 28  GLUCOSE 131* 115*  BUN 16 15  CREATININE 0.96 0.92  CALCIUM 8.1* 8.1*    PT/INR:  Recent Labs    08/24/19 1551  LABPROT 20.9*  INR 1.8*   ABG    Component Value Date/Time   PHART 7.344 (L) 08/24/2019 1957   HCO3 24.0 08/24/2019  1957   TCO2 25 08/24/2019 1957   ACIDBASEDEF 2.0 08/24/2019 1957   O2SAT 96.0 08/24/2019 1957   CBG (last 3)  Recent Labs    08/26/19 1951 08/26/19 2331 08/27/19 0349  GLUCAP 120* 107* 121*    Meds Scheduled Meds: . acetaminophen  1,000 mg Oral Q6H  . aspirin EC  325 mg Oral Daily  . bisacodyl  10 mg Oral Daily   Or  . bisacodyl  10 mg Rectal Daily  . carvedilol  12.5 mg Oral BID WC  . chlorhexidine gluconate (MEDLINE KIT)  15 mL Mouth Rinse BID  . Chlorhexidine Gluconate Cloth  6 each Topical Daily  . colchicine  0.3 mg Oral BID  . docusate sodium  200 mg Oral Daily  . furosemide  40 mg Intravenous BID  . insulin aspart  0-24 Units Subcutaneous Q4H  . lisinopril  5 mg Oral Daily  . pantoprazole  40 mg Oral Daily  . potassium chloride  20 mEq Oral Q4H  . rosuvastatin  5 mg Oral Daily  . sodium chloride flush  10-40 mL Intracatheter Q12H  . sodium chloride flush  3 mL Intravenous Q12H   Continuous Infusions: . sodium chloride Stopped (08/25/19 0514)  . amiodarone 30 mg/hr (08/27/19 0700)  . lactated ringers Stopped (08/25/19 1433)  . lactated ringers    . nitroGLYCERIN Stopped (08/26/19 1802)   PRN Meds:.metoprolol tartrate, morphine injection,  ondansetron (ZOFRAN) IV, oxyCODONE, sodium chloride flush, sodium chloride flush, traMADol  Xrays DG Chest Port 1 View  Result Date: 08/26/2019 CLINICAL DATA:  History of open heart surgery. 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 Prosthetic aortic valve unchanged. Lungs are adequately inflated with linear density over the right midlung and left base likely atelectasis and not significantly changed. No evidence of effusion or pneumothorax. Borderline stable cardiomegaly. Remainder of the exam is unchanged. IMPRESSION: 1. Stable linear density over the right midlung and left base likely atelectasis. 2.  Right IJ central venous sheath with tip over the SVC.  Electronically Signed   By: Marin Olp M.D.   On: 08/26/2019 08:18    Assessment/Plan: S/P Procedure(s) (LRB): AORTIC VALVE REPLACEMENT (AVR) USING INSPIRIS VALVE SIZE 25MM (N/A) CORONARY ARTERY BYPASS GRAFTING (CABG) x two, using bilateral leg greater saphenous veins harvested endoscopically (N/A) TRANSESOPHAGEAL ECHOCARDIOGRAM (TEE) (N/A)   1 conts to progress well POD #3 2 afib, + PVC's /PAC's- cont amio gtt for now, replace K+, MG++, consider low dose beta blocker . Depending on course may need ACRx. BP pretty well controlled , will increase lisinopril a little 3 sats good on 1-2 liters 4 H/H -stable ABL anemia- monitor  5 no fevers- leukocytosis trend is improving, d/c foley 6 BS adeq controlled for hospitalized patient, HgB A1C 6.0 on no DM meds 7 thrombocytopenia is stable, slightly improved- monitor clinically 8 mod volume overload- cont current BID IV lasix for now- good UOP 9 may be ready for floor soon  LOS: 3 days    John Giovanni PA-C Pager [phone] 08/27/2019   I have seen and examined the patient and agree with the assessment and plan as outlined.  Convert amiodarone to oral.  Increase dosages of carvedilol and ACE-I to preop dosage.  Mobilize.  Diuresis.  Transfer 4E  Rexene Alberts, MD 08/27/2019 7:59 AM

## 2019-08-27 NOTE — Progress Notes (Signed)
CARDIAC REHAB PHASE I   PRE:  Rate/Rhythm: 65 SR  BP:  Sitting: 101/65      SaO2: 96 RA  MODE:  Ambulation: 295 ft   POST:  Rate/Rhythm: 105 ST  BP:  Sitting: 134/60    SaO2: 99 RA  Pt ambulated 290ft in hallway assist of one with EVA. Pt with some visible SOB at end of walk, coached through purse lipped breathing. Pt returned to bed. Reinforced IS use, walks, and sternal precautions. Will continue to follow.  [id number] Reynold Bowen, RN BSN 08/27/2019 2:57 PM

## 2019-08-27 NOTE — Progress Notes (Signed)
Patient arrive from 2h to 4e14, patient placed on monitor and and vital signs obtained. Will monitor patient. Dewane Timson, Randall An RN

## 2019-08-27 NOTE — Addendum Note (Signed)
Addendum  created 08/27/19 1046 by Adair Laundry, CRNA   Order list changed

## 2019-08-27 NOTE — Discharge Summary (Signed)
Physician Discharge Summary  Patient ID: Antonio Riley MRN: [id number] DOB/AGE: [birth year] 54 y.o.  Admit date: 08/24/2019 Discharge date: 08/29/2019  Admission Diagnoses:Aortic stenosis/CAD  Discharge Diagnoses:  Principal Problem:   S/P aortic valve replacement with bioprosthetic valve + CABG x2 Active Problems:   Aortic stenosis   Coronary artery disease involving native coronary artery of native heart without angina pectoris   S/P CABG x 2   Patient Active Problem List   Diagnosis Date Noted  . S/P aortic valve replacement with bioprosthetic valve + CABG x2 08/24/2019  . 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 Coronary artery disease involving native coronary artery of native heart without angina pectoris   . Aortic stenosis   . BMI 40.0-44.9, adult (HCC)   . Knee pain 03/15/2018  . Swelling of lower leg 03/15/2018  . Resistant hypertension 03/15/2018  HPI:  Patient is 54 year old morbidly obese white male with history of hypertension who has been referred for surgical consultation to discuss treatment options for management ofsevere symptomatic aortic stenosis.  Patient states that he was first noted to have a heart murmur several years ago by his primary care physician. He was referred for cardiology consultation and evaluated by Dr. Dulce Sellar November 2019. Transthoracic echocardiogram was ordered but never performed. Patient states that he did not go through with echocardiogram because of the high associated out-of-pocket expense. The patient was recently seen in follow-up by Dr. Dulce Sellar and transthoracic echocardiogram eventually performed June 26, 2019. Echocardiogram revealed normal left ventricular systolic function with likely bicuspid aortic valve and severe aortic stenosis. Cardiothoracic surgical consultation was requested.  Patient is married and lives locally in [place] with his wife and one of his adult children. He works full-time as a Chartered certified accountant. He lives a  somewhat sedentary lifestyle but reports no significant physical limitation other than that of exertional shortness of breath. He states that he has had symptoms of exertional shortness of breath and chest discomfort for at least 3 or 4 years. Symptoms have continued to gradually progress. He now gets short of breath with moderate level activity. Symptoms are sometimes associated with tightness across the chest. Symptoms are always relieved by rest. He denies nocturnal chest pain or shortness of breath. He reports occasional mild dizzy spells with no history of syncope. He has had some mild lower extremity edema. He denies history of PND, orthopnea, or lower extremity edema.  Patient was originally seen in consultation on July 23, 2019. Since then he underwent diagnostic cardiac catheterization by Dr. Dulce Sellar on July 31, 2019. Catheterization confirmed the presence of at least moderate aortic stenosis and also revealed severe two-vessel coronary artery disease with 100% chronic occlusion of the right coronary artery and a high-grade stenosis of proximal left circumflex coronary artery. Right heart pressures were normal. CT angiography was performed on the patient returns to our office for follow-up today. He reports no new problems or complaints although he continues to experience symptoms of exertional chest pain and shortness of breath consistent with both chronic diastolic congestive heart failure and likely stable angina pectoris.   Discharged Condition: good  Hospital Course: at time of consultation: Patient underwent uncomplicated aortic valve replacement and CABG x2 using a 25 mm Edwards Inspiris Resilia stented bovine pericardial tissue valve with SVG's to distal RCA and OM branch of LCx on 08/24/2019.  Intraoperative findings were notable for normal LV systolic function.   The patient was extubated using standard protocols without difficulty.  He does have an expected acute blood  loss  anemia and on postop day 1 he was transfused 1 unit of packed cells.  Additionally the Swan-Ganz was removed on postoperative day #1.  He was started on a low-dose beta-blocker as well.  On postop day 2 he developed atrial fibrillation and was started on an amiodarone drip.  He did have some elevated systolic blood pressure readings and was started back on lisinopril at a low dose.  Both beta-blocker and ACE inhibitor were resumed at preop dose on postoperative day #3.  He does have some moderate postoperative volume overload and peripheral edema.  He has been put on a course of intravenous diuretics and is responding well with excellent urine output. Renal function has remained within normal limits.  Blood sugars have been under good control and he has no specific history of diabetes, being on no medications preoperatively as well.  His hemoglobin A1c preoperatively was 6.0. Chest x-ray on postoperative day #3 shows only mild atelectasis with mostly clear lung fields.  He is progressing well in his cardiac rehab and ambulation.  He continues to use incentive spirometry in a routine manner.On POD # 4 he has continued to make good progress and is maintaining sinus rhythm. He continues to have volume overload but is responding well to IV lasix. This will be transitioned to daily po dosing at discharge in addition to home spironolactone dosing. His Crestor will be increased to 20 mg at discharge for higher intensity dosing post CABG. oxygen has been weaned and he maintains good saturations on room air.  Incisions are noted to be healing well without evidence of infection.  He does have moderate ecchymosis in bilateral thighs but no evidence of hematoma.  He is tolerating gradually increasing activity using standard cardiac rehab protocols.  We will arrange for a walker at discharge due to his degree of postoperative deconditioning for safety.  At the time of discharge the patient is felt to be quite  stable.   Consults: None  Significant Diagnostic Studies: Routine postop serial labs and chest x-rays  Treatments: surgery:  Date of Service:  08/24/2019 3:04 PM             CARDIOTHORACIC SURGERY OPERATIVE NOTE  Date of Procedure:                08/24/2019  Preoperative Diagnosis:       ? Severe Aortic Stenosis ? Severe 2-vessel Coronary Artery Disease  Postoperative Diagnosis:    Same  Procedure:        Aortic Valve Replacement             Edwards Inspiris Resilia Stented Bovine Pericardial Tissue Valve (size 25mm, ref # [id number], serial # [id number])   Coronary Artery Bypass Grafting x 2             Saphenous Vein Graft to Distal Right Coronary Artery             Saphenous Vein Graft to Obtuse Marginal Branch of Left Circumflex Coronary Artery             Endoscopic Vein Harvest from Bilateral Thighs  Surgeon:        Salvatore Decent. Cornelius Moras, MD  Assistant:       Rowe Clack, PA-C  Anesthesia:    Germaine Pomfret, MD     Conclusion: Cardiac catheterization:  1.  Two-vessel coronary artery disease with total occlusion of the right coronary artery, the PDA and PLA branches fill late from right to right  collaterals and faint left-to-right collaterals.  Moderate stenosis of the proximal circumflex beyond the intermediate branch origin 2.  Widely patent left main, LAD, and ramus intermedius with only mild nonobstructive disease 3.  Moderate aortic stenosis with mean gradient 32 mmHg and calculated aortic valve area 1.49 sq cm 4.  Essentially normal right heart hemodynamics with preserved cardiac output  Recommendations: Continued surgical evaluation for AVR/CABG in this symptomatic patient with severe aortic stenosis by echo criteria  Indications  Severe aortic stenosis [I35.0 (ICD-10-CM)]  Procedural Details  Technical Details INDICATION: Severe symptomatic aortic stenosis (preoperative cath study)  PROCEDURAL DETAILS: There was an indwelling IV in a right  antecubital vein. Using normal sterile technique, the IV was changed out for a 5 Fr brachial sheath over a 0.018 inch wire. The right wrist was then prepped, draped, and anesthetized with 1% lidocaine. Using the modified Seldinger technique a 5/6 French Slender sheath was placed in the right radial artery. Intra-arterial verapamil was administered through the radial artery sheath. IV heparin was administered after a JR4 catheter was advanced into the central aorta. A Swan-Ganz catheter was used for the right heart catheterization. Standard protocol was followed for recording of right heart pressures and sampling of oxygen saturations. Fick cardiac output was calculated. Standard Judkins catheters were used for selective coronary angiography. LV pressure is recorded and an aortic valve pullback is performed.  The aortic valve is crossed with an AL-1 catheter and straight tip wire.  There were no immediate procedural complications. The patient was transferred to the post catheterization recovery area for further monitoring.    Estimated blood loss <50 mL.   During this procedure medications were administered to achieve and maintain moderate conscious sedation while the patient's heart rate, blood pressure, and oxygen saturation were continuously monitored and I was present face-to-face 100% of this time.  Medications (Filter: Administrations occurring from 07/31/19 0715 to 07/31/19 0847) Heparin (Porcine) in NaCl 1000-0.9 UT/500ML-% SOLN (mL) Total volume:  1,000 mL  Date/Time  Rate/Dose/Volume Action  07/31/19 0738  500 mL Given  0738  500 mL Given    midazolam (VERSED) injection (mg) Total dose:  2 mg  Date/Time  Rate/Dose/Volume Action  07/31/19 0742  1 mg Given  0746  1 mg Given    fentaNYL (SUBLIMAZE) injection (mcg) Total dose:  25 mcg  Date/Time  Rate/Dose/Volume Action  07/31/19 0743  25 mcg Given    Canceled Entry    lidocaine (PF) (XYLOCAINE) 1 % injection (mL) Total volume:  5  mL  Date/Time  Rate/Dose/Volume Action  07/31/19 0759  2 mL Given  0802  3 mL Given    Radial Cocktail/Verapamil only (mL) Total volume:  10 mL  Date/Time  Rate/Dose/Volume Action  07/31/19 0800  10 mL Given    heparin injection (Units) Total dose:  6,000 Units  Date/Time  Rate/Dose/Volume Action  07/31/19 0815  6,000 Units Given    iohexol (OMNIPAQUE) 350 MG/ML injection (mL) Total volume:  65 mL  Date/Time  Rate/Dose/Volume Action  07/31/19 0837  65 mL Given    Sedation Time  Sedation Time Physician-1: 57 minutes 34 seconds  Contrast  Medication Name Total Dose  iohexol (OMNIPAQUE) 350 MG/ML injection 65 mL    Radiation/Fluoro  Fluoro time: 9.6 (min) DAP: 61207 (mGycm2) Cumulative Air Kerma: 969 (mGy)  Coronary Findings  Diagnostic Dominance: Right Left Anterior Descending  There is mild diffuse disease throughout the vessel. Large vessel, wraps around the LV apex, mild diffuse irregularity with no  significant stenosis throughout. The 1st diagonal branch is relatively small in caliber with no significant stenosis present.  Ramus Intermedius  Ramus lesion 30% stenosed  Ramus lesion is 30% stenosed.  Left Circumflex  Ost Cx to Prox Cx lesion 60% stenosed  Ost Cx to Prox Cx lesion is 60% stenosed. There is moderate stenosis at the ostium of the circumflex just beyond the intermediate origin. This vessel supplies a single posterolateral branch of significance and a 2nd very small PL branch.  Right Coronary Artery  Prox RCA to Mid RCA lesion 100% stenosed  Prox RCA to Mid RCA lesion is 100% stenosed. Dominant vessel, total occlusion in the midportion at the origin of the 1st RV marginal branch. The distal RCA and branch vessels are filled late by right to right and left to right collaterals. The PDA and PL branches are not well visualized via either collateral system.  Right Posterior Descending Artery  Collaterals  RPDA filled by collaterals from RV Branch.     Second Right Posterolateral Branch  Collaterals  2nd RPL filled by collaterals from RV Branch.    Third Right Posterolateral Branch  Collaterals  3rd RPL filled by collaterals from 2nd Sept.    Intervention  No interventions have been documented. Left Heart  Aortic Valve There is moderate aortic valve stenosis. There is restricted aortic valve motion. Mean transvalvular gradient 32 mmHg, calculated aortic valve area 1.49 sq cm  Coronary Diagrams  Diagnostic Dominance: Right  Intervention  Implants   No implant documentation for this case.  Syngo Images  Show images for CARDIAC CATHETERIZATION  Images on Long Term Storage  Show images for Shults, Koji Darrell "Darrell"   Link to Procedure Log  Procedure Log    Hemo Data   Most Recent Value  Fick Cardiac Output 6.67 L/min  Fick Cardiac Output Index 2.52 (L/min)/BSA  Aortic Mean Gradient 31.7 mmHg  Aortic Peak Gradient 33 mmHg  Aortic Valve Area 1.49  Aortic Value Area Index 0.57 cm2/BSA  RA A Wave 7 mmHg  RA V Wave 6 mmHg  RA Mean 6 mmHg  RV Systolic Pressure 34 mmHg  RV Diastolic Pressure 5 mmHg  RV EDP 9 mmHg  PA Systolic Pressure 34 mmHg  PA Diastolic Pressure 15 mmHg  PA Mean 22 mmHg  PW A Wave 23 mmHg  PW V Wave 24 mmHg  PW Mean 18 mmHg  AO Systolic Pressure 161 mmHg  AO Diastolic Pressure 71 mmHg  AO Mean 95 mmHg  LV Systolic Pressure 096 mmHg  LV Diastolic Pressure 6 mmHg  LV EDP 18 mmHg  AOp Systolic Pressure 045 mmHg  AOp Diastolic Pressure 64 mmHg  AOp Mean Pressure 91 mmHg  LVp Systolic Pressure 409 mmHg  LVp Diastolic Pressure 7 mmHg  LVp EDP Pressure 18 mmHg  QP/QS 0.93  TPVR Index 8.71 HRUI  TSVR Index 37.63 HRUI  PVR SVR Ratio 0.04  TPVR/TSVR Ratio 0.23  Encounter-Level Documents - 07/31/2019:  Scan on 08/01/2019 10:04 AM by Default, Provider, MD Document on 07/31/2019 11:48 AM by Mosie Lukes, RN: IP After Visit Summary Document on 07/31/2019 11:48 AM by Mosie Lukes, RN:  After Visit Summary Scan on 07/31/2019 8:44 AM by Default, Provider, MD Electronic signature on 07/30/2019 7:14 PM - Quintella Baton Documents:  There are no order-level documents. Signed  Electronically signed by Sherren Mocha, MD on 07/31/19 at 0847 EDT      ECHOCARDIOGRAM REPORT  Patient Name:  ALPHONSE ASBRIDGE Regnier Date of Exam: 06/26/2019  Medical Rec #: [id number]      Height:    73.0 in  Accession #:  [id number]     Weight:    319.0 lb  Date of Birth: 06-24-1965      BSA:     2.62 m  Patient Age:  53 years      BP:      162/86 mmHg  Patient Gender: M          HR:      65 bpm.  Exam Location: [facility]   Procedure: 2D Echo   Indications:  Nonrheumatic aortic valve stenosis [I35.0 (ICD-10-CM)];  Swelling         of lower leg [M79.89 (ICD-10-CM)]    History:    Patient has no prior history of Echocardiogram  examinations.         Signs/Symptoms:Murmur; Risk Factors:Hypertension.    Sonographer:  Louie Boston  Referring Phys: [phone] BRIAN J MUNLEY   IMPRESSIONS    1. Left ventricular ejection fraction, by estimation, is 65 to 70%. The  left ventricle has normal function. The left ventrical has no regional  wall motion abnormalities. There is moderately increased left ventricular  hypertrophy. Left ventricular  diastolic parameters are consistent with Grade II diastolic dysfunction  (pseudonormalization).  2. Left atrial size was mildly dilated.  3. Trivial mitral valve regurgitation.  4. The aortic valve is hickened, moderate capcification noted, restricted  mobility. Can not r/o bicuspid valve. Aortic valve regurgitation is mild  to moderate. Severe aortic valve stenosis. Peak/mean gradient : 80/43  mmHg, AVA 0.85 cm2, DI 0.27.  5. The inferior vena cava is normal in size with greater than 50%  respiratory variability, suggesting right atrial pressure of 3  mmHg.   FINDINGS  Left Ventricle: Left ventricular ejection fraction, by estimation, is 65  to 70%. The left ventricle has normal function. The left ventricle has no  regional wall motion abnormalities. The left ventricular internal cavity  size was normal in size. There is  moderately increased left ventricular hypertrophy. Left ventricular  diastolic parameters are consistent with Grade II diastolic dysfunction  (pseudonormalization).   Right Ventricle: The right ventricular size is normal. No increase in  right ventricular wall thickness. Right ventricular systolic function is  normal. There is normal pulmonary artery systolic pressure. The tricuspid  regurgitant velocity is 2.42 m/s, and  with an assumed right atrial pressure of 3 mmHg, the estimated right  ventricular systolic pressure is 26.4 mmHg.   Left Atrium: Left atrial size was mildly dilated.   Right Atrium: Right atrial size was normal in size.   Pericardium: There is no evidence of pericardial effusion.   Mitral Valve: The mitral valve is normal in structure and function. Normal  mobility of the mitral valve leaflets. Trivial mitral valve regurgitation.  No evidence of mitral valve stenosis. MV peak gradient, 5.3 mmHg. The mean  mitral valve gradient is 3.0  mmHg.   Tricuspid Valve: The tricuspid valve is normal in structure. Tricuspid  valve regurgitation is not demonstrated. No evidence of tricuspid  stenosis.   Aortic Valve: The aortic valve is normal in structure and function.. There  is moderate thickening and moderate calcification of the aortic valve.  Aortic valve regurgitation is mild to moderate. Aortic regurgitation PHT  measures 671 msec. Severe aortic  stenosis is present. There is moderate thickening of the aortic valve.  There is moderate calcification of the aortic  valve. Aortic valve mean  gradient measures 43.0 mmHg. Aortic valve peak gradient measures 80.3  mmHg. Aortic valve area, by VTI  measures  0.85 cm.   Pulmonic Valve: The pulmonic valve was normal in structure. Pulmonic valve  regurgitation is not visualized. No evidence of pulmonic stenosis.   Aorta: The aortic root is normal in size and structure.   Venous: The inferior vena cava is normal in size with greater than 50%  respiratory variability, suggesting right atrial pressure of 3 mmHg. The  inferior vena cava and the hepatic vein show a normal flow pattern.   IAS/Shunts: No atrial level shunt detected by color flow Doppler.     LEFT VENTRICLE  PLAX 2D  LVIDd:     5.10 cm Diastology  LVIDs:     3.30 cm LV e' lateral:  6.42 cm/s  LV PW:     1.80 cm LV E/e' lateral: 20.1  LV IVS:    1.70 cm LV e' medial:  5.22 cm/s  LVOT diam:   2.00 cm LV E/e' medial: 24.7  LV SV:     98.02 ml  LV SV Index:  28.53  LVOT Area:   3.14 cm     RIGHT VENTRICLE     IVC  TAPSE (M-mode): 3.1 cm IVC diam: 1.70 cm   LEFT ATRIUM       Index    RIGHT ATRIUM      Index  LA diam:    4.90 cm 1.87 cm/m RA Area:   15.30 cm  LA Vol (A2C):  107.0 ml 40.78 ml/m RA Volume:  38.80 ml 14.79 ml/m  LA Vol (A4C):  90.8 ml 34.61 ml/m  LA Biplane Vol: 100.0 ml 38.11 ml/m  AORTIC VALVE  AV Area (Vmax):  0.83 cm  AV Area (Vmean):  0.80 cm  AV Area (VTI):   0.85 cm  AV Vmax:      448.00 cm/s  AV Vmean:     308.000 cm/s  AV VTI:      1.150 m  AV Peak Grad:   80.3 mmHg  AV Mean Grad:   43.0 mmHg  LVOT Vmax:     119.00 cm/s  LVOT Vmean:    78.100 cm/s  LVOT VTI:     0.312 m  LVOT/AV VTI ratio: 0.27  AI PHT:      671 msec    AORTA  Ao Root diam: 3.50 cm   MITRAL VALVE             TRICUSPID VALVE  MV Area (PHT): 2.51 cm       TR Peak grad:  23.4 mmHg  MV Peak grad: 5.3 mmHg       TR Vmax:    242.00 cm/s  MV Mean grad: 3.0 mmHg  MV Vmax:    1.15 m/s       SHUNTS  MV  Vmean:   75.3 cm/s       Systemic VTI: 0.31 m  MV Decel Time: 303 msec       Systemic Diam: 2.00 cm  MV E velocity: 129.00 cm/s 103 cm/s  MV A velocity: 80.80 cm/s 70.3 cm/s  MV E/A ratio: 1.60    1.5   Gypsy Balsam MD  Electronically signed by Gypsy Balsam MD  Signature Date/Time: 06/26/2019/12:26:31 PM      Final     Discharge Exam: Blood pressure 134/84, pulse 65, temperature 98.4 F (36.9 C), temperature source Oral, resp. rate 19, height 6\' 1"  (  1.854 m), weight (!) 151.2 kg, SpO2 98 %.  General appearance: alert, cooperative and no distress Heart: regular rate and rhythm Lungs: clear to auscultation bilaterally Abdomen: benign, obese Extremities: + edema BLE Wound: incis healing well, mod echymosis both thighs   Disposition: Discharge disposition: 01-Home or Self Care       Discharge Instructions    Discharge patient   Complete by: As directed    Discharge disposition: 01-Home or Self Care   Discharge patient date: 08/29/2019     Allergies as of 08/29/2019      Reactions   Coricidin Hbp Cough-cold [chlorpheniramine-dm] Hives      Medication List    STOP taking these medications   hydrochlorothiazide 25 MG tablet Commonly known as: HYDRODIURIL     TAKE these medications   amiodarone 200 MG tablet Commonly known as: PACERONE Take 1 tablet (200 mg total) by mouth 2 (two) times daily after a meal.   amoxicillin 500 MG capsule Commonly known as: AMOXIL Take 4 capsules (2,000 mg) 45 minutes before dental procedures.   aspirin 325 MG EC tablet Take 1 tablet (325 mg total) by mouth daily.   CALCIUM 600/VITAMIN D PO Take 1 tablet by mouth daily.   carvedilol 25 MG tablet Commonly known as: COREG Take 1 tablet (25 mg total) by mouth 2 (two) times daily.   ferrous fumarate-b12-vitamic C-folic acid capsule Commonly known as: TRINSICON / FOLTRIN Take 1 capsule by mouth daily with breakfast.   furosemide 40 MG  tablet Commonly known as: Lasix Take 1 tablet (40 mg total) by mouth daily.   lisinopril 20 MG tablet Commonly known as: ZESTRIL Take 1 tablet (20 mg total) by mouth daily.   omeprazole 20 MG tablet Commonly known as: PRILOSEC OTC Take 20 mg by mouth daily.   OSTEO BI-FLEX ONE PER DAY PO Take 1 tablet by mouth daily.   OVER THE COUNTER MEDICATION Take 1 tablet by mouth daily. Allergy Medicine   Potassium Chloride ER 20 MEQ Tbcr Take 20 mEq by mouth daily.   rosuvastatin 20 MG tablet Commonly known as: CRESTOR Take 1 tablet (20 mg total) by mouth daily. What changed:   medication strength  how much to take   spironolactone 25 MG tablet Commonly known as: ALDACTONE Take 1 tablet (25 mg total) by mouth daily.   traMADol 50 MG tablet Commonly known as: ULTRAM Take 1 tablet (50 mg total) by mouth every 6 (six) hours as needed for up to 7 days for moderate pain.            Durable Medical Equipment  (From admission, onward)         Start     Ordered   08/29/19 0910  For home use only DME Walker rolling  Once    Question Answer Comment  Walker: With 5 Inch Wheels   Patient needs a walker to treat with the following condition Physical deconditioning      08/29/19 0910   08/29/19 0902  For home use only DME Walker rolling  Once    Question Answer Comment  Walker: With 5 Inch Wheels   Patient needs a walker to treat with the following condition Mobility impaired      08/29/19 0902         Follow-up Information    Purcell Nails, MD Follow up.   Specialty: Cardiothoracic Surgery Contact information: [address] [place] [address] [place] [address] [phone]  Baldo Daub, MD Follow up.   Specialty: Cardiology Contact information: [address] [place] [address] [phone]          The patient has been discharged on:   1.Beta Blocker:  Yes [  y ]                              No   [   ]                              If  No, reason:  2.Ace Inhibitor/ARB: Yes [ y  ]                                     No  [    ]                                     If No, reason:  3.Statin:   Yes [place]   ]                  No  [   ]                  If No, reason:  4.Ecasa:  Yes  [ y  ]                  No   [   ]                  If No, reason:  Signed: Rowe Clack 08/29/2019, 9:24 AM

## 2019-08-28 ENCOUNTER — Inpatient Hospital Stay (HOSPITAL_COMMUNITY): Payer: 59

## 2019-08-28 LAB — BASIC METABOLIC PANEL
Anion gap: 9 (ref 5–15)
BUN: 18 mg/dL (ref 6–20)
CO2: 26 mmol/L (ref 22–32)
Calcium: 7.9 mg/dL — ABNORMAL LOW (ref 8.9–10.3)
Chloride: 97 mmol/L — ABNORMAL LOW (ref 98–111)
Creatinine, Ser: 0.97 mg/dL (ref 0.61–1.24)
GFR calc Af Amer: >60 mL/min (ref >60)
GFR calc non Af Amer: >60 mL/min (ref >60)
Glucose, Bld: 114 mg/dL — ABNORMAL HIGH (ref 70–99)
Potassium: 3.8 mmol/L (ref 3.5–5.1)
Sodium: 132 mmol/L — ABNORMAL LOW (ref 135–145)

## 2019-08-28 LAB — CBC
HCT: 24.6 % — ABNORMAL LOW (ref 39.0–52.0)
Hemoglobin: 8.1 g/dL — ABNORMAL LOW (ref 13.0–17.0)
MCH: 29.8 pg (ref 26.0–34.0)
MCHC: 32.9 g/dL (ref 30.0–36.0)
MCV: 90.4 fL (ref 80.0–100.0)
Platelets: 138 10*3/uL — ABNORMAL LOW (ref 150–400)
RBC: 2.72 MIL/uL — ABNORMAL LOW (ref 4.22–5.81)
RDW: 13.2 % (ref 11.5–15.5)
WBC: 11.3 10*3/uL — ABNORMAL HIGH (ref 4.0–10.5)
nRBC: 0 % (ref 0.0–0.2)

## 2019-08-28 MED ORDER — FE FUMARATE-B12-VIT C-FA-IFC PO CAPS
1.0000 | ORAL_CAPSULE | Freq: Every day | ORAL | Status: DC
  Administered 2019-08-29: 1 via ORAL
  Filled 2019-08-28: qty 1

## 2019-08-28 MED ORDER — AMIODARONE HCL 200 MG PO TABS
200.0000 mg | ORAL_TABLET | Freq: Two times a day (BID) | ORAL | Status: DC
  Administered 2019-08-28 – 2019-08-29 (×2): 200 mg via ORAL
  Filled 2019-08-28 (×2): qty 1

## 2019-08-28 NOTE — Progress Notes (Addendum)
[address]       [place] [address]             [phone]      4 Days Post-Op Procedure(s) (LRB): AORTIC VALVE REPLACEMENT (AVR) USING INSPIRIS VALVE SIZE 25MM (N/A) CORONARY ARTERY BYPASS GRAFTING (CABG) x two, using bilateral leg greater saphenous veins harvested endoscopically (N/A) TRANSESOPHAGEAL ECHOCARDIOGRAM (TEE) (N/A) Subjective: Feels very well   Objective: Vital signs in last 24 hours: Temp:  [97.6 F (36.4 C)-98.6 F (37 C)] 98.6 F (37 C) (04/13 0553) Pulse Rate:  [61-85] 73 (04/13 0553) Cardiac Rhythm: Normal sinus rhythm (04/13 0553) Resp:  [0-25] 18 (04/13 0553) BP: (96-141)/(24-79) 117/71 (04/13 0553) SpO2:  [93 %-100 %] 95 % (04/13 0553) Weight:  [153.1 kg] 153.1 kg (04/13 0500)  Hemodynamic parameters for last 24 hours:    Intake/Output from previous day: 04/12 0701 - 04/13 0700 In: 101.1 [I.V.:51.1; IV Piggyback:50] Out: 2250 [Urine:2250] Intake/Output this shift: No intake/output data recorded.  General appearance: alert, cooperative and no distress Heart: regular rate and rhythm Lungs: clear to auscultation bilaterally Abdomen: benign Extremities: + BLE edema Wound: incis healing well  Lab Results: Recent Labs    08/27/19 0419 08/28/19 0257  WBC 15.2* 11.3*  HGB 8.4* 8.1*  HCT 25.0* 24.6*  PLT 99* 138*   BMET:  Recent Labs    08/27/19 0419 08/28/19 0257  NA 135 132*  K 3.5 3.8  CL 98 97*  CO2 28 26  GLUCOSE 115* 114*  BUN 15 18  CREATININE 0.92 0.97  CALCIUM 8.1* 7.9*    PT/INR: No results for input(s): LABPROT, INR in the last 72 hours. ABG    Component Value Date/Time   PHART 7.344 (L) 08/24/2019 1957   HCO3 24.0 08/24/2019 1957   TCO2 25 08/24/2019 1957   ACIDBASEDEF 2.0 08/24/2019 1957   O2SAT 96.0 08/24/2019 1957   CBG (last 3)  Recent Labs    08/26/19 2331 08/27/19 0349 08/27/19 0758  GLUCAP 107* 121* 164*    Meds Scheduled Meds: . acetaminophen  1,000 mg Oral Q6H  .  amiodarone  400 mg Oral BID PC  . aspirin EC  325 mg Oral Daily  . bisacodyl  10 mg Oral Daily   Or  . bisacodyl  10 mg Rectal Daily  . carvedilol  25 mg Oral BID  . chlorhexidine gluconate (MEDLINE KIT)  15 mL Mouth Rinse BID  . Chlorhexidine Gluconate Cloth  6 each Topical Daily  . docusate sodium  200 mg Oral Daily  . furosemide  40 mg Intravenous BID  . lisinopril  20 mg Oral Daily  . magnesium oxide  400 mg Oral BID  . pantoprazole  40 mg Oral Daily  . rosuvastatin  5 mg Oral Daily  . sodium chloride flush  10-40 mL Intracatheter Q12H  . sodium chloride flush  3 mL Intravenous Q12H  . spironolactone  25 mg Oral Daily   Continuous Infusions: . sodium chloride Stopped (08/25/19 0514)   PRN Meds:.metoprolol tartrate, morphine injection, ondansetron (ZOFRAN) IV, oxyCODONE, sodium chloride flush, sodium chloride flush, traMADol  Xrays DG Chest 1 View  Result Date: 08/27/2019 CLINICAL DATA:  Post cardiac valve replacement and CABG. EXAM: CHEST  1 VIEW COMPARISON:  08/26/2019 FINDINGS: Right jugular central line is present. Cardiomediastinal silhouette remains prominent but unchanged. Stable bandlike density at the left lung base is suggestive for atelectasis. Decreased atelectasis in the mid right lung. Negative for a pneumothorax.  No frank pulmonary edema. IMPRESSION: 1. Decreased atelectasis in the right lung. 2. Minimal change in left basilar atelectasis. Electronically Signed   By: Markus Daft M.D.   On: 08/27/2019 08:03    Assessment/Plan: S/P Procedure(s) (LRB): AORTIC VALVE REPLACEMENT (AVR) USING INSPIRIS VALVE SIZE 25MM (N/A) CORONARY ARTERY BYPASS GRAFTING (CABG) x two, using bilateral leg greater saphenous veins harvested endoscopically (N/A) TRANSESOPHAGEAL ECHOCARDIOGRAM (TEE) (N/A)  POD# 4 conts to do well, now on 4E  1 hemodyn stable in sinus rhythm, Amio now PO 2 sats good on RA 3 leukocytosis conts to trend improved, no fevers 4 H/H , ABL anemia is stable.  Thrombocytopenia trend also conts to improve. 5 BS well controlled 6 CXR minor ATX, cont pulm toilet, rehab 7 may be able to switch to po lasix 8 pos home later today v tomorrow  LOS: 4 days    John Giovanni PA-C Pager [phone] 08/28/2019  I have seen and examined the patient and agree with the assessment and plan as outlined.  Still needs diuresis - will continue lasix IV.  Tentatively plan d/c home tomorrow.  Rexene Alberts, MD 08/28/2019 12:21 PM

## 2019-08-28 NOTE — Progress Notes (Signed)
CARDIAC REHAB PHASE I   PRE:  Rate/Rhythm: 72 SR  BP:  Sitting: 120/74      SaO2: 97 RA  MODE:  Ambulation: 470 ft   POST:  Rate/Rhythm: 120 ST  BP:  Sitting: 145/84    SaO2: 100 RA  Pt ambulated 423ft in hallway standby assist with front wheel walker. Pt denies CP, some SOB upon return to room. Pt coached through purse-lipped breathing. Encouraged continued ambulation and IS use. Pt states he may get to go home today. Will f/u this afternoon. Pt states he is asking his family if they have a rolling walker at home, if not he will need one for d/c.  [id number] Reynold Bowen, RN BSN 08/28/2019 10:20 AM

## 2019-08-29 MED ORDER — TRAMADOL HCL 50 MG PO TABS: 50.0000 mg | ORAL_TABLET | Freq: Four times a day (QID) | ORAL | 0 refills | Status: AC | PRN

## 2019-08-29 MED ORDER — ASPIRIN 325 MG PO TBEC: 325.0000 mg | DELAYED_RELEASE_TABLET | Freq: Every day | ORAL | Status: DC

## 2019-08-29 MED ORDER — FUROSEMIDE 40 MG PO TABS: 40.0000 mg | ORAL_TABLET | Freq: Every day | ORAL | 0 refills | Status: DC

## 2019-08-29 MED ORDER — FE FUMARATE-B12-VIT C-FA-IFC PO CAPS: 1.0000 | ORAL_CAPSULE | Freq: Every day | ORAL | 2 refills | Status: DC

## 2019-08-29 MED ORDER — AMIODARONE HCL 200 MG PO TABS: 200.0000 mg | ORAL_TABLET | Freq: Two times a day (BID) | ORAL | 1 refills | Status: DC

## 2019-08-29 MED ORDER — ROSUVASTATIN CALCIUM 20 MG PO TABS
20.0000 mg | ORAL_TABLET | Freq: Every day | ORAL | Status: DC
  Administered 2019-08-29: 20 mg via ORAL
  Filled 2019-08-29: qty 1

## 2019-08-29 MED ORDER — ROSUVASTATIN CALCIUM 20 MG PO TABS: 20.0000 mg | ORAL_TABLET | Freq: Every day | ORAL | 1 refills | Status: DC

## 2019-08-29 MED ORDER — POTASSIUM CHLORIDE ER 20 MEQ PO TBCR: 20.0000 meq | EXTENDED_RELEASE_TABLET | Freq: Every day | ORAL | 0 refills | Status: DC

## 2019-08-29 NOTE — Progress Notes (Signed)
CARDIAC REHAB PHASE I   D/c education completed with pt. Pt educated on importance of site care and monitoring incision daily. Encouraged continued IS use, walks, and sternal precautions. Pt given in-the-tube sheet, heart healthy diet and smoking cessation tip sheet. Reviewed restrictions and exercise guidelines. Will refer to CRP II [place]. Pt is interested in participating in Virtual Cardiac and Pulmonary Rehab. Pt advised that Virtual Cardiac and Pulmonary Rehab is provided at no cost to the patient.  Checklist:  1. Pt has smart device  ie smartphone and/or ipad for downloading an app  Yes 2. Reliable internet/wifi service    Yes 3. Understands how to use their smartphone and navigate within an app.  Yes  Pt verbalized understanding and is in agreement.  [id number] Reynold Bowen, RN BSN 08/29/2019 9:46 AM

## 2019-08-29 NOTE — Progress Notes (Addendum)
[address]       [place] [address]             [phone]      5 Days Post-Op Procedure(s) (LRB): AORTIC VALVE REPLACEMENT (AVR) USING INSPIRIS VALVE SIZE 25MM (N/A) CORONARY ARTERY BYPASS GRAFTING (CABG) x two, using bilateral leg greater saphenous veins harvested endoscopically (N/A) TRANSESOPHAGEAL ECHOCARDIOGRAM (TEE) (N/A) Subjective: conts to look and feel well  Objective: Vital signs in last 24 hours: Temp:  [98.1 F (36.7 C)-98.4 F (36.9 C)] 98.1 F (36.7 C) (04/14 0359) Pulse Rate:  [63-88] 65 (04/14 0359) Cardiac Rhythm: Normal sinus rhythm (04/14 0359) Resp:  [10-20] 19 (04/14 0359) BP: (110-122)/(55-95) 121/76 (04/14 0359) SpO2:  [97 %-100 %] 98 % (04/14 0359) Weight:  [151.2 kg] 151.2 kg (04/14 0402)  Hemodynamic parameters for last 24 hours:    Intake/Output from previous day: 04/13 0701 - 04/14 0700 In: 240 [P.O.:240] Out: 2550 [Urine:2550] Intake/Output this shift: No intake/output data recorded.  General appearance: alert, cooperative and no distress Heart: regular rate and rhythm Lungs: clear to auscultation bilaterally Abdomen: benign, obese Extremities: + edema BLE Wound: incis healing well, mod echymosis both thighs  Lab Results: Recent Labs    08/27/19 0419 08/28/19 0257  WBC 15.2* 11.3*  HGB 8.4* 8.1*  HCT 25.0* 24.6*  PLT 99* 138*   BMET:  Recent Labs    08/27/19 0419 08/28/19 0257  NA 135 132*  K 3.5 3.8  CL 98 97*  CO2 28 26  GLUCOSE 115* 114*  BUN 15 18  CREATININE 0.92 0.97  CALCIUM 8.1* 7.9*    PT/INR: No results for input(s): LABPROT, INR in the last 72 hours. ABG    Component Value Date/Time   PHART 7.344 (L) 08/24/2019 1957   HCO3 24.0 08/24/2019 1957   TCO2 25 08/24/2019 1957   ACIDBASEDEF 2.0 08/24/2019 1957   O2SAT 96.0 08/24/2019 1957   CBG (last 3)  Recent Labs    08/26/19 2331 08/27/19 0349 08/27/19 0758  GLUCAP 107* 121* 164*    Meds Scheduled Meds: .  acetaminophen  1,000 mg Oral Q6H  . amiodarone  200 mg Oral BID PC  . aspirin EC  325 mg Oral Daily  . bisacodyl  10 mg Oral Daily   Or  . bisacodyl  10 mg Rectal Daily  . carvedilol  25 mg Oral BID  . chlorhexidine gluconate (MEDLINE KIT)  15 mL Mouth Rinse BID  . Chlorhexidine Gluconate Cloth  6 each Topical Daily  . docusate sodium  200 mg Oral Daily  . ferrous [id number] C-folic acid  1 capsule Oral Q breakfast  . furosemide  40 mg Intravenous BID  . lisinopril  20 mg Oral Daily  . magnesium oxide  400 mg Oral BID  . pantoprazole  40 mg Oral Daily  . rosuvastatin  5 mg Oral Daily  . sodium chloride flush  10-40 mL Intracatheter Q12H  . sodium chloride flush  3 mL Intravenous Q12H  . spironolactone  25 mg Oral Daily   Continuous Infusions: . sodium chloride Stopped (08/25/19 0514)   PRN Meds:.metoprolol tartrate, morphine injection, ondansetron (ZOFRAN) IV, oxyCODONE, sodium chloride flush, sodium chloride flush, traMADol  Xrays DG Chest 2 View  Result Date: 08/28/2019 CLINICAL DATA:  Post open heart surgery 4-5 days ago. EXAM: CHEST - 2 VIEW COMPARISON:  08/27/2019 FINDINGS: Lungs are adequately inflated with linear density over the right midlung and left base unchanged likely atelectasis.  No focal consolidation or effusion. Flattening of the hemidiaphragms on the lateral film. Stable cardiomegaly. Remainder of the exam is unchanged. IMPRESSION: Stable linear atelectasis over the right midlung and left base. Stable cardiomegaly. Electronically Signed   By: Marin Olp M.D.   On: 08/28/2019 09:25    Assessment/Plan: S/P Procedure(s) (LRB): AORTIC VALVE REPLACEMENT (AVR) USING INSPIRIS VALVE SIZE 25MM (N/A) CORONARY ARTERY BYPASS GRAFTING (CABG) x two, using bilateral leg greater saphenous veins harvested endoscopically (N/A) TRANSESOPHAGEAL ECHOCARDIOGRAM (TEE) (N/A)  1 doing well overall 2 hemodyn stable, sinus with rare PAC's/PVC's, rare brady in 50's, amio  reduced to 200 bid 3 sats good on RA 4 no new labs  5 will cont lasix/K+ daily, cont spiro 6 will increase crestor to 20 7 reviewed instructions 8 stable for d/c    LOS: 5 days    John Giovanni PA-C Pager [phone] 08/29/2019   I have seen and examined the patient and agree with the assessment and plan as outlined.  D/C home today.  Rexene Alberts, MD 08/29/2019 9:51 AM

## 2019-08-29 NOTE — TOC Transition Note (Signed)
Transition of Care [facility]) - CM/SW Discharge Note Donn Pierini RN, BSN Transitions of Care Unit 4E- RN Case Manager [phone]   Patient Details  Name: Day Deery Reznik MRN: [id number] Date of Birth: 01-15-1966  Transition of Care [facility]) CM/SW Contact:  Darrold Span, RN Phone Number: 08/29/2019, 12:13 PM   Clinical Narrative:    Pt stable for transition home today, order for RW placed, call made to [facility] with [employer] for DME need- RW to be delivered to room prior to discharge.  Wife arrived for transport- per bedside RN pt did not want to wait for RW to be delivered- and stated to RN to just "forget about it"- pt left prior to DME delivery.    Final next level of care: Rest Home Barriers to Discharge: No Barriers Identified   Patient Goals and CMS Choice    home    Discharge Placement               Home        Discharge Plan and Services   Discharge Planning Services: CM Consult Post Acute Care Choice: Durable Medical Equipment          DME Arranged: Walker rolling DME Agency: [employer] Date DME Agency Contacted: 08/29/19 Time DME Agency Contacted: [phone] Representative spoke with at DME Agency: Ian Malkin HH Arranged: NA HH Agency: NA        Social Determinants of Health (SDOH) Interventions     Readmission Risk Interventions No flowsheet data found.

## 2019-08-30 ENCOUNTER — Telehealth (HOSPITAL_COMMUNITY): Payer: Self-pay

## 2019-08-30 NOTE — Telephone Encounter (Signed)
Called patient to see if he is interested in the Cardiac Rehab Program. Patient expressed interest. Explained scheduling process and went over insurance, patient verbalized understanding. Will contact patient for scheduling once f/u has been completed.  °

## 2019-08-30 NOTE — Telephone Encounter (Signed)
Pt insurance is active and benefits verified through [facility]. Co-pay $50.00, DED $2,500.00/$2,500.00 met, out of pocket $8,150.00/$8,150.00 met, co-insurance 0%. No pre-authorization required. Passport, 08/30/19 @ 12:22PM, AQV#[id number]  Will contact patient to see if he is interested in the Cardiac Rehab Program. If interested, patient will need to complete follow up appt. Once completed, patient will be contacted for scheduling upon review by the RN Navigator.

## 2019-09-02 ENCOUNTER — Other Ambulatory Visit: Payer: Self-pay | Admitting: Cardiothoracic Surgery

## 2019-09-02 MED ORDER — TEMAZEPAM 7.5 MG PO CAPS: 7.5000 mg | ORAL_CAPSULE | Freq: Every evening | ORAL | 0 refills | Status: DC | PRN

## 2019-09-06 MED FILL — Mannitol IV Soln 20%: INTRAVENOUS | Qty: 500 | Status: AC

## 2019-09-06 MED FILL — Electrolyte-R (PH 7.4) Solution: INTRAVENOUS | Qty: 2000 | Status: AC

## 2019-09-06 MED FILL — Electrolyte-R (PH 7.4) Solution: INTRAVENOUS | Qty: 9000 | Status: AC

## 2019-09-06 MED FILL — Sodium Chloride IV Soln 0.9%: INTRAVENOUS | Qty: 6000 | Status: AC

## 2019-09-06 MED FILL — Albumin, Human Inj 5%: INTRAVENOUS | Qty: 750 | Status: AC

## 2019-09-06 MED FILL — Heparin Sodium (Porcine) Inj 1000 Unit/ML: INTRAMUSCULAR | Qty: 20 | Status: AC

## 2019-09-06 MED FILL — Sodium Bicarbonate IV Soln 8.4%: INTRAVENOUS | Qty: 50 | Status: AC

## 2019-09-11 ENCOUNTER — Encounter: Payer: Self-pay | Admitting: Cardiology

## 2019-09-11 ENCOUNTER — Ambulatory Visit (INDEPENDENT_AMBULATORY_CARE_PROVIDER_SITE_OTHER): Payer: 59 | Admitting: Cardiology

## 2019-09-11 ENCOUNTER — Telehealth: Payer: Self-pay | Admitting: Cardiology

## 2019-09-11 ENCOUNTER — Other Ambulatory Visit: Payer: Self-pay

## 2019-09-11 VITALS — BP 82/58 | HR 78 | Ht 73.0 in | Wt 311.0 lb

## 2019-09-11 DIAGNOSIS — I952 Hypotension due to drugs: Secondary | ICD-10-CM | POA: Diagnosis not present

## 2019-09-11 DIAGNOSIS — Z953 Presence of xenogenic heart valve: Secondary | ICD-10-CM | POA: Diagnosis not present

## 2019-09-11 DIAGNOSIS — Z951 Presence of aortocoronary bypass graft: Secondary | ICD-10-CM

## 2019-09-11 DIAGNOSIS — I10 Essential (primary) hypertension: Secondary | ICD-10-CM | POA: Diagnosis not present

## 2019-09-11 MED ORDER — CARVEDILOL 12.5 MG PO TABS: 12.5000 mg | ORAL_TABLET | Freq: Two times a day (BID) | ORAL | 3 refills | Status: DC

## 2019-09-11 NOTE — Telephone Encounter (Signed)
Pt c/o weakness in the past few days. He states that he wonders if his hgb has dropped since being released from the hospital.  Pt denies shortness of breath, chest pain or bleeding. Pt states his hgb was 8.1 08/28/19. I offered him an appointment with Dr. Servando Salina today and he declined. Pt advised to go to the ED or call the office back if needed appointment. How do you advise on doing labs today?

## 2019-09-11 NOTE — Patient Instructions (Signed)
Medication Instructions:  Your physician has recommended you make the following change in your medication:  STOP: FUROSEMIDE STOP: SPIRONOLACTONE STOP: Lisinopril DECREASE: Carvedilol to 12.5 mg take one tablet by mouth twice daily. Do not start taking this until 09/13/19. *If you need a refill on your cardiac medications before your next appointment, please call your pharmacy*   Lab Work: Your physician recommends that you return for lab work in: TODAY CBC, BMP If you have labs (blood work) drawn today and your tests are completely normal, you will receive your results only by: Marland Kitchen MyChart Message (if you have MyChart) OR . A paper copy in the mail If you have any lab test that is abnormal or we need to change your treatment, we will call you to review the results.   Testing/Procedures: None   Follow-Up: At [facility], you and your health needs are our priority.  As part of our continuing mission to provide you with exceptional heart care, we have created designated Provider Care Teams.  These Care Teams include your primary Cardiologist (physician) and Advanced Practice Providers (APPs -  Physician Assistants and Nurse Practitioners) who all work together to provide you with the care you need, when you need it.  We recommend signing up for the patient portal called "MyChart".  Sign up information is provided on this After Visit Summary.  MyChart is used to connect with patients for Virtual Visits (Telemedicine).  Patients are able to view lab/test results, encounter notes, upcoming appointments, etc.  Non-urgent messages can be sent to your provider as well.   To learn more about what you can do with MyChart, go to [email or web address].    Your next appointment:   Keep appointment on Monday.  The format for your next appointment:   In Person  Provider:   Norman Herrlich, MD   Other Instructions Please take your BP at home and record these. Let us know if your BP is <90.

## 2019-09-11 NOTE — Telephone Encounter (Signed)
   Mother pf the patient called. The patient had open heart surgery 04-09. He came home from the hospital 04-19 and was doing well at first. Just within the past day or two he has gone from having lots of energy to having almost no energy. The Mother wanted to know if the patient could be seen sooner

## 2019-09-11 NOTE — Progress Notes (Signed)
Cardiology Office Note:    Date:  09/11/2019   ID:  Antonio Riley, DOB 09-27-1965, MRN [id number]  PCP:  Baldo Ash, FNP  Cardiologist:  Norman Herrlich, MD    Referring MD: Baldo Ash, FNP    ASSESSMENT:    1. Hypotension due to drugs   2. Resistant hypertension   3. S/P aortic valve replacement with bioprosthetic valve + CABG x2   4. S/P CABG x 2    PLAN:    In order of problems listed above:  1. He is not doing well volume contracted hypotensive stop diuretics discontinue lisinopril and reduce his beta-blocker to half after 24-hour hiatus.  Check stat labs including a CBC with transfusion and recheck renal function.  I offered him to go to the emergency room for IV fluids and he declined.  I do not think he is having a postoperative complication he has no fever chills no wound drainage and no signs of pericardial effusion.  He has an appointment for May 3 that he will keep   Medication Adjustments/Labs and Tests Ordered: Current medicines are reviewed at length with the patient today.  Concerns regarding medicines are outlined above.  No orders of the defined types were placed in this encounter.  No orders of the defined types were placed in this encounter.   Chief Complaint  Patient presents with  . Shortness of Breath  . Fatigue  . Hypotension    History of Present Illness:    Antonio Riley is a 54 y.o. male with a hx of hypertension bicuspid aortic valve recent aortic valve replacement last seen by me 07/11/2019. 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  He was discharged to [facility] after surgical AVR for 04/2020 his blood pressure discharge was 109-151/60-85 last hemoglobin 8.1 and renal function was normal with a creatinine of 0.97  His weight is down 15 pounds since discharge from the hospital he feels increasingly weak lightheaded and blurred vision he  is hypotensive blood pressure 80/50.  He has no edema or neck vein distention and no pulses paradoxus.  I will have him hold all of his diuretics his lisinopril in a 24-hour hiatus from carvedilol and receive half dose we will check stat labs today CBC BMP is appointment next week with me and call our office if he continues to be hypotensive systolics less than 90. Past Medical History:  Diagnosis Date  . Anginal pain (HCC)   . Aortic stenosis   . BMI 40.0-44.9, adult (HCC)   . Coronary artery disease   . Heart murmur   . Hypertension   . Knee pain 03/15/2018  . S/P aortic valve replacement with bioprosthetic valve 08/24/2019   25 mm Edwards Inspiris Resilia stented bovine pericardial tissue valve  . 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 Swelling of lower leg     Past Surgical History:  Procedure Laterality Date  . AORTIC VALVE REPLACEMENT N/A 08/24/2019   Procedure: AORTIC VALVE REPLACEMENT (AVR) USING INSPIRIS VALVE SIZE ;  Surgeon: Purcell Nails, MD;  Location: [facility];  Service: Open Heart Surgery;  Laterality: N/A;  . CORONARY ARTERY BYPASS GRAFT N/A 08/24/2019   Procedure: CORONARY ARTERY BYPASS GRAFTING (CABG) x two, using bilateral leg greater saphenous veins harvested endoscopically;  Surgeon: Purcell Nails, MD;  Location: [facility] OR;  Service:  Open Heart Surgery;  Laterality: N/A;  . FEMUR SURGERY    . RIGHT/LEFT HEART CATH AND CORONARY ANGIOGRAPHY N/A 07/31/2019   Procedure: RIGHT/LEFT HEART CATH AND CORONARY ANGIOGRAPHY;  Surgeon: Sherren Mocha, MD;  Location: [place] CV LAB;  Service: Cardiovascular;  Laterality: N/A;  . TEE WITHOUT CARDIOVERSION N/A 08/24/2019   Procedure: TRANSESOPHAGEAL ECHOCARDIOGRAM (TEE);  Surgeon: Rexene Alberts, MD;  Location: [place];  Service: Open Heart Surgery;  Laterality: N/A;  . WRIST SURGERY      Current Medications: Current Meds  Medication Sig  . amiodarone (PACERONE) 200 MG tablet Take 1 tablet  (200 mg total) by mouth 2 (two) times daily after a meal.  . amoxicillin (AMOXIL) 500 MG capsule Take 4 capsules (2,000 mg) 45 minutes before dental procedures.  Marland Kitchen aspirin EC 325 MG EC tablet Take 1 tablet (325 mg total) by mouth daily.  . [place]-Glucosamine-Vit D (OSTEO BI-FLEX ONE PER DAY PO) Take 1 tablet by mouth daily.   . Calcium Carbonate-Vitamin D (CALCIUM 600/VITAMIN D PO) Take 1 tablet by mouth daily.  . carvedilol (COREG) 25 MG tablet Take 1 tablet (25 mg total) by mouth 2 (two) times daily.  . ferrous [id number] C-folic acid (TRINSICON / FOLTRIN) capsule Take 1 capsule by mouth daily with breakfast.  . furosemide (LASIX) 40 MG tablet Take 1 tablet (40 mg total) by mouth daily.  Marland Kitchen lisinopril (ZESTRIL) 20 MG tablet Take 1 tablet (20 mg total) by mouth daily.  Marland Kitchen omeprazole (PRILOSEC OTC) 20 MG tablet Take 20 mg by mouth daily.  Marland Kitchen OVER THE COUNTER MEDICATION Take 1 tablet by mouth daily. Allergy Medicine  . potassium chloride 20 MEQ TBCR Take 20 mEq by mouth daily.  . rosuvastatin (CRESTOR) 20 MG tablet Take 1 tablet (20 mg total) by mouth daily.  Marland Kitchen spironolactone (ALDACTONE) 25 MG tablet Take 1 tablet (25 mg total) by mouth daily.  . temazepam (RESTORIL) 7.5 MG capsule Take 1 capsule (7.5 mg total) by mouth at bedtime as needed for sleep.     Allergies:   Coricidin hbp cough-cold [chlorpheniramine-dm]   Social History   Socioeconomic History  . Marital status: Married    Spouse name: Not on file  . Number of children: Not on file  . Years of education: Not on file  . Highest education level: Not on file  Occupational History  . Not on file  Tobacco Use  . Smoking status: Former Smoker    Packs/day: 0.25    Years: 28.00    Pack years: 7.00    Types: Cigarettes    Quit date: 08/21/2019    Years since quitting: 0.0  . Smokeless tobacco: Never Used  . Tobacco comment: trying to quit  Substance and Sexual Activity  . Alcohol use: Yes    Comment: occ beer   .  Drug use: Not Currently  . Sexual activity: Not on file  Other Topics Concern  . Not on file  Social History Narrative  . Not on file   Social Determinants of Health   Financial Resource Strain:   . Difficulty of Paying Living Expenses:   Food Insecurity:   . Worried About Charity fundraiser in the Last Year:   . Arboriculturist in the Last Year:   Transportation Needs:   . Film/video editor (Medical):   Marland Kitchen Lack of Transportation (Non-Medical):   Physical Activity:   . Days of Exercise per Week:   . Minutes of Exercise per  Session:   Stress:   . Feeling of Stress :   Social Connections:   . Frequency of Communication with Friends and Family:   . Frequency of Social Gatherings with Friends and Family:   . Attends Religious Services:   . Active Member of Clubs or Organizations:   . Attends Banker Meetings:   Marland Kitchen Marital Status:      Family History: The patient's family history includes CAD in his mother; Deep vein thrombosis in his mother; Diabetes in his cousin and mother; Heart murmur in his mother. ROS:   Please see the history of present illness.    All other systems reviewed and are negative.  EKGs/Labs/Other Studies Reviewed:    The following studies were reviewed today:  EKG:  EKG ordered today and personally reviewed.  The ekg ordered today demonstrates sinus rhythm normal.  Recent Labs: 04/30/2019: NT-Pro BNP 86 08/22/2019: ALT 21 08/25/2019: Magnesium 1.9 08/28/2019: BUN 18; Creatinine, Ser 0.97; Hemoglobin 8.1; Platelets 138; Potassium 3.8; Sodium 132  Recent Lipid Panel    Component Value Date/Time   CHOL 173 04/30/2019 1215   TRIG 117 04/30/2019 1215   HDL 45 04/30/2019 1215   CHOLHDL 3.8 04/30/2019 1215   LDLCALC 107 (H) 04/30/2019 1215    Physical Exam:    VS:  BP (!) 82/58 (BP Location: Right Arm, Patient Position: Sitting, Cuff Size: Large)   Pulse 78   Ht 6\' 1"  (1.854 m)   Wt (!) 311 lb (141.1 kg)   SpO2 98%   BMI 41.03  kg/m     Wt Readings from Last 3 Encounters:  09/11/19 (!) 311 lb (141.1 kg)  08/29/19 (!) 333 lb 5.4 oz (151.2 kg)  08/22/19 (!) 324 lb (147 kg)     GEN: He appears weak well nourished, well developed in no acute distress HEENT: Normal NECK: No JVD; No carotid bruits LYMPHATICS: No lymphadenopathy CARDIAC: RRR, no murmurs, rubs, gallops RESPIRATORY:  Clear to auscultation without rales, wheezing or rhonchi  ABDOMEN: Soft, non-tender, non-distended MUSCULOSKELETAL:  No edema; No deformity  SKIN: Warm and dry mild pallor of the skin NEUROLOGIC:  Alert and oriented x 3 PSYCHIATRIC:  Normal affect    Signed, 10/22/19, MD  09/11/2019 4:25 PM    [facility]

## 2019-09-11 NOTE — Telephone Encounter (Signed)
Can we recommend with Dr. Servando Salina today as an urgent.  Post hospital visit

## 2019-09-11 NOTE — Telephone Encounter (Signed)
Follow up   Pt's mother calling back to follow up  Please call

## 2019-09-12 ENCOUNTER — Telehealth: Payer: Self-pay

## 2019-09-12 DIAGNOSIS — I10 Essential (primary) hypertension: Secondary | ICD-10-CM

## 2019-09-12 LAB — BASIC METABOLIC PANEL
BUN/Creatinine Ratio: 11 (ref 9–20)
BUN: 20 mg/dL (ref 6–24)
CO2: 21 mmol/L (ref 20–29)
Calcium: 9.7 mg/dL (ref 8.7–10.2)
Chloride: 91 mmol/L — ABNORMAL LOW (ref 96–106)
Creatinine, Ser: 1.82 mg/dL — ABNORMAL HIGH (ref 0.76–1.27)
GFR calc Af Amer: 48 mL/min/{1.73_m2} — ABNORMAL LOW (ref >59)
GFR calc non Af Amer: 42 mL/min/{1.73_m2} — ABNORMAL LOW (ref >59)
Glucose: 133 mg/dL — ABNORMAL HIGH (ref 65–99)
Potassium: 6 mmol/L — ABNORMAL HIGH (ref 3.5–5.2)
Sodium: 128 mmol/L — ABNORMAL LOW (ref 134–144)

## 2019-09-12 LAB — CBC
Hematocrit: 34.8 % — ABNORMAL LOW (ref 37.5–51.0)
Hemoglobin: 11.3 g/dL — ABNORMAL LOW (ref 13.0–17.7)
MCH: 28.9 pg (ref 26.6–33.0)
MCHC: 32.5 g/dL (ref 31.5–35.7)
MCV: 89 fL (ref 79–97)
Platelets: 532 10*3/uL — ABNORMAL HIGH (ref 150–450)
RBC: 3.91 x10E6/uL — ABNORMAL LOW (ref 4.14–5.80)
RDW: 12.9 % (ref 11.6–15.4)
WBC: 14.5 10*3/uL — ABNORMAL HIGH (ref 3.4–10.8)

## 2019-09-12 MED ORDER — LOKELMA 10 G PO PACK: 10.0000 g | PACK | Freq: Every day | ORAL | 3 refills | Status: DC

## 2019-09-12 NOTE — Telephone Encounter (Signed)
Spoke with patient regarding results and recommendations.  Patient verbalizes understanding and is agreeable to plan of care. Advised patient to call back with any issues or concerns.  

## 2019-09-12 NOTE — Telephone Encounter (Signed)
-----   Message from Baldo Daub, MD sent at 09/12/2019  7:51 AM EDT ----- I am most concerned about his kidney function  Lokelma 10 g daily  Have him return on early Friday morning our office for stat BMP

## 2019-09-12 NOTE — Telephone Encounter (Signed)
See previous telephone note for documentation.

## 2019-09-13 ENCOUNTER — Telehealth: Payer: Self-pay | Admitting: Cardiology

## 2019-09-13 ENCOUNTER — Encounter (HOSPITAL_COMMUNITY): Payer: Self-pay | Admitting: Emergency Medicine

## 2019-09-13 ENCOUNTER — Emergency Department (HOSPITAL_COMMUNITY)
Admission: EM | Admit: 2019-09-13 | Discharge: 2019-09-13 | Disposition: A | Discharge status: Home / Self Care | Payer: 59 | Attending: Emergency Medicine | Admitting: Emergency Medicine

## 2019-09-13 ENCOUNTER — Other Ambulatory Visit: Payer: Self-pay

## 2019-09-13 DIAGNOSIS — I2510 Atherosclerotic heart disease of native coronary artery without angina pectoris: Secondary | ICD-10-CM | POA: Diagnosis not present

## 2019-09-13 DIAGNOSIS — Z87891 Personal history of nicotine dependence: Secondary | ICD-10-CM | POA: Diagnosis not present

## 2019-09-13 DIAGNOSIS — R319 Hematuria, unspecified: Secondary | ICD-10-CM | POA: Diagnosis present

## 2019-09-13 DIAGNOSIS — Z7982 Long term (current) use of aspirin: Secondary | ICD-10-CM | POA: Diagnosis not present

## 2019-09-13 DIAGNOSIS — I10 Essential (primary) hypertension: Secondary | ICD-10-CM | POA: Insufficient documentation

## 2019-09-13 DIAGNOSIS — N3091 Cystitis, unspecified with hematuria: Secondary | ICD-10-CM | POA: Diagnosis not present

## 2019-09-13 DIAGNOSIS — Z951 Presence of aortocoronary bypass graft: Secondary | ICD-10-CM | POA: Insufficient documentation

## 2019-09-13 DIAGNOSIS — Z79899 Other long term (current) drug therapy: Secondary | ICD-10-CM | POA: Diagnosis not present

## 2019-09-13 LAB — URINALYSIS, ROUTINE W REFLEX MICROSCOPIC

## 2019-09-13 LAB — COMPREHENSIVE METABOLIC PANEL
ALT: 27 U/L (ref 0–44)
AST: 17 U/L (ref 15–41)
Albumin: 3.6 g/dL (ref 3.5–5.0)
Alkaline Phosphatase: 76 U/L (ref 38–126)
Anion gap: 11 (ref 5–15)
BUN: 19 mg/dL (ref 6–20)
CO2: 23 mmol/L (ref 22–32)
Calcium: 9.5 mg/dL (ref 8.9–10.3)
Chloride: 93 mmol/L — ABNORMAL LOW (ref 98–111)
Creatinine, Ser: 1.36 mg/dL — ABNORMAL HIGH (ref 0.61–1.24)
GFR calc Af Amer: >60 mL/min (ref >60)
GFR calc non Af Amer: 59 mL/min — ABNORMAL LOW (ref >60)
Glucose, Bld: 128 mg/dL — ABNORMAL HIGH (ref 70–99)
Potassium: 4.9 mmol/L (ref 3.5–5.1)
Sodium: 127 mmol/L — ABNORMAL LOW (ref 135–145)
Total Bilirubin: 1.1 mg/dL (ref 0.3–1.2)
Total Protein: 8 g/dL (ref 6.5–8.1)

## 2019-09-13 LAB — URINALYSIS, MICROSCOPIC (REFLEX)
RBC / HPF: >50 RBC/hpf (ref 0–5)
WBC, UA: >50 WBC/hpf (ref 0–5)

## 2019-09-13 LAB — CBC
HCT: 36.1 % — ABNORMAL LOW (ref 39.0–52.0)
Hemoglobin: 11.4 g/dL — ABNORMAL LOW (ref 13.0–17.0)
MCH: 28.4 pg (ref 26.0–34.0)
MCHC: 31.6 g/dL (ref 30.0–36.0)
MCV: 89.8 fL (ref 80.0–100.0)
Platelets: 435 10*3/uL — ABNORMAL HIGH (ref 150–400)
RBC: 4.02 MIL/uL — ABNORMAL LOW (ref 4.22–5.81)
RDW: 12.9 % (ref 11.5–15.5)
WBC: 16.7 10*3/uL — ABNORMAL HIGH (ref 4.0–10.5)
nRBC: 0 % (ref 0.0–0.2)

## 2019-09-13 MED ORDER — SODIUM CHLORIDE 0.9 % IV SOLN
1.0000 g | Freq: Once | INTRAVENOUS | Status: AC
  Administered 2019-09-13: 15:00:00 1 g via INTRAVENOUS
  Filled 2019-09-13: qty 10

## 2019-09-13 MED ORDER — CEFDINIR 300 MG PO CAPS: 300.0000 mg | ORAL_CAPSULE | Freq: Two times a day (BID) | ORAL | 0 refills | Status: DC

## 2019-09-13 NOTE — ED Notes (Signed)
Hooked patient to the monitor patient is resting with call bell in reach 

## 2019-09-13 NOTE — Telephone Encounter (Signed)
Pt woke up this morning and said he was urinating blood. He has not started his medication that Dr. Dulce Sellar put him on on Tuesday yet.

## 2019-09-13 NOTE — Telephone Encounter (Signed)
Spoke to patient just now. Dr. Dulce Sellar has advised that he go directly to the ED. Patient is aware of this and verbalizes understanding, No other issues or concerns noted at this time.    Encouraged patient to call back with any questions or concerns.

## 2019-09-13 NOTE — ED Provider Notes (Signed)
[facility] EMERGENCY DEPARTMENT Provider Note   CSN: [id number] Arrival date & time: 09/13/19  3016     History Chief Complaint  Patient presents with  . Hematuria    Delvecchio Madole Brozowski is a 54 y.o. male.  The history is provided by the patient and medical records. No language interpreter was used.  Hematuria   Kirill Chatterjee Grable is a 54 y.o. male who presents to the Emergency Department complaining of hematuria.  He is three weeks s/p CABG and aortic valve surgery.  He was doing well following the surgery and this morning he developed dysuria and hematuria.  His urine is very red with some small clots mixed in.  He has associated urinary frequency.  He feels like he is not voiding completely.  No fever, CP, sob, N/V, abdominal pain.  He has poor appetite since leaving the hospital.  He takes 325 mg aspirin daily.      Past Medical History:  Diagnosis Date  . Anginal pain (HCC)   . Aortic stenosis   . BMI 40.0-44.9, adult (HCC)   . Coronary artery disease   . Heart murmur   . Hypertension   . Knee pain 03/15/2018  . S/P aortic valve replacement with bioprosthetic valve 08/24/2019   25 mm Edwards Inspiris Resilia stented bovine pericardial tissue valve  . 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 Swelling of lower leg     Patient Active Problem List   Diagnosis Date Noted  . S/P aortic valve replacement with bioprosthetic valve + CABG x2 08/24/2019  . 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 Coronary artery disease involving native coronary artery of native heart without angina pectoris   . Aortic stenosis   . BMI 40.0-44.9, adult (HCC)   . Knee pain 03/15/2018  . Swelling of lower leg 03/15/2018  . Resistant hypertension 03/15/2018    Past Surgical History:  Procedure Laterality Date  . AORTIC VALVE REPLACEMENT N/A 08/24/2019   Procedure: AORTIC VALVE REPLACEMENT (AVR) USING INSPIRIS VALVE SIZE ;  Surgeon: Purcell Nails, MD;  Location: [facility] OR;  Service: Open Heart Surgery;  Laterality: N/A;  . CORONARY ARTERY BYPASS GRAFT N/A 08/24/2019   Procedure: CORONARY ARTERY BYPASS GRAFTING (CABG) x two, using bilateral leg greater saphenous veins harvested endoscopically;  Surgeon: Purcell Nails, MD;  Location: [facility] OR;  Service: Open Heart Surgery;  Laterality: N/A;  . FEMUR SURGERY    . RIGHT/LEFT HEART CATH AND CORONARY ANGIOGRAPHY N/A 07/31/2019   Procedure: RIGHT/LEFT HEART CATH AND CORONARY ANGIOGRAPHY;  Surgeon: Tonny Bollman, MD;  Location: [facility] INVASIVE CV LAB;  Service: Cardiovascular;  Laterality: N/A;  . TEE WITHOUT CARDIOVERSION N/A 08/24/2019   Procedure: TRANSESOPHAGEAL ECHOCARDIOGRAM (TEE);  Surgeon: Purcell Nails, MD;  Location: [facility] OR;  Service: Open Heart Surgery;  Laterality: N/A;  . WRIST SURGERY         Family History  Problem Relation Age of Onset  . CAD Mother   . Deep vein thrombosis Mother   . Heart murmur Mother   . Diabetes Mother   . Diabetes Cousin     Social History   Tobacco Use  . Smoking status: Former Smoker    Packs/day: 0.25    Years: 28.00    Pack years: 7.00    Types: Cigarettes    Quit date: 08/21/2019    Years since quitting: 0.0  . Smokeless tobacco: Never  Used  . Tobacco comment: trying to quit  Substance Use Topics  . Alcohol use: Yes    Comment: occ beer   . Drug use: Not Currently    Home Medications Prior to Admission medications   Medication Sig Start Date End Date Taking? Authorizing Provider  amiodarone (PACERONE) 200 MG tablet Take 1 tablet (200 mg total) by mouth 2 (two) times daily after a meal. 08/29/19   Gold, Wayne E, PA-C  amoxicillin (AMOXIL) 500 MG capsule Take 4 capsules (2,000 mg) 45 minutes before dental procedures. 03/23/18   Baldo Daub, MD  aspirin EC 325 MG EC tablet Take 1 tablet (325 mg total) by mouth daily. 08/29/19   Gold, Glenice Laine, PA-C  Boswellia-Glucosamine-Vit D (OSTEO BI-FLEX ONE PER DAY PO) Take 1 tablet by mouth daily.      [provider]  Calcium Carbonate-Vitamin D (CALCIUM 600/VITAMIN D PO) Take 1 tablet by mouth daily.    [provider]  carvedilol (COREG) 12.5 MG tablet Take 1 tablet (12.5 mg total) by mouth 2 (two) times daily. 09/11/19 12/10/19  Baldo Daub, MD  cefdinir (OMNICEF) 300 MG capsule Take 1 capsule (300 mg total) by mouth 2 (two) times daily. 09/13/19   Tilden Fossa, MD  ferrous fumarate-b12-vitamic C-folic acid (TRINSICON / FOLTRIN) capsule Take 1 capsule by mouth daily with breakfast. 08/29/19   Gold, Glenice Laine, PA-C  omeprazole (PRILOSEC OTC) 20 MG tablet Take 20 mg by mouth daily.    [provider]  OVER THE COUNTER MEDICATION Take 1 tablet by mouth daily. Allergy Medicine    [provider]  potassium chloride 20 MEQ TBCR Take 20 mEq by mouth daily. 08/29/19   Gold, Wayne E, PA-C  rosuvastatin (CRESTOR) 20 MG tablet Take 1 tablet (20 mg total) by mouth daily. 08/29/19   Gold, Deniece Portela E, PA-C  sodium zirconium cyclosilicate (LOKELMA) 10 g PACK packet Take 10 g by mouth daily. 09/12/19   Baldo Daub, MD  temazepam (RESTORIL) 7.5 MG capsule Take 1 capsule (7.5 mg total) by mouth at bedtime as needed for sleep. 09/02/19   Kerin Perna, MD    Allergies    Coricidin hbp cough-cold [chlorpheniramine-dm]  Review of Systems   Review of Systems  Genitourinary: Positive for hematuria.  All other systems reviewed and are negative.   Physical Exam Updated Vital Signs BP 110/74 (BP Location: Left Arm)   Pulse 73   Temp 98.2 F (36.8 C) (Oral)   Resp 18   Ht 6\' 1"  (1.854 m)   Wt (!) 140.6 kg   SpO2 98%   BMI 40.90 kg/m   Physical Exam Vitals and nursing note reviewed.  Constitutional:      Appearance: He is well-developed.  HENT:     Head: Normocephalic and atraumatic.  Cardiovascular:     Rate and Rhythm: Normal rate and regular rhythm.     Heart sounds: No murmur.  Pulmonary:     Effort: Pulmonary effort is normal. No respiratory  distress.     Breath sounds: Normal breath sounds.     Comments: Healing sternal surgical site Abdominal:     Palpations: Abdomen is soft.     Tenderness: There is no abdominal tenderness. There is no guarding or rebound.  Genitourinary:    Penis: Normal.   Musculoskeletal:        General: No tenderness.  Skin:    General: Skin is warm and dry.  Neurological:     Mental  Status: He is alert and oriented to person, place, and time.  Psychiatric:        Behavior: Behavior normal.     ED Results / Procedures / Treatments   Labs (all labs ordered are listed, but only abnormal results are displayed) Labs Reviewed  COMPREHENSIVE METABOLIC PANEL - Abnormal; Notable for the following components:      Result Value   Sodium 127 (*)    Chloride 93 (*)    Glucose, Bld 128 (*)    Creatinine, Ser 1.36 (*)    GFR calc non Af Amer 59 (*)    All other components within normal limits  CBC - Abnormal; Notable for the following components:   WBC 16.7 (*)    RBC 4.02 (*)    Hemoglobin 11.4 (*)    HCT 36.1 (*)    Platelets 435 (*)    All other components within normal limits  URINALYSIS, ROUTINE W REFLEX MICROSCOPIC - Abnormal; Notable for the following components:   Color, Urine RED (*)    APPearance TURBID (*)    Glucose, UA   (*)    Value: TEST NOT REPORTED DUE TO COLOR INTERFERENCE OF URINE PIGMENT   Hgb urine dipstick   (*)    Value: TEST NOT REPORTED DUE TO COLOR INTERFERENCE OF URINE PIGMENT   Bilirubin Urine   (*)    Value: TEST NOT REPORTED DUE TO COLOR INTERFERENCE OF URINE PIGMENT   Ketones, ur   (*)    Value: TEST NOT REPORTED DUE TO COLOR INTERFERENCE OF URINE PIGMENT   Protein, ur   (*)    Value: TEST NOT REPORTED DUE TO COLOR INTERFERENCE OF URINE PIGMENT   Nitrite   (*)    Value: TEST NOT REPORTED DUE TO COLOR INTERFERENCE OF URINE PIGMENT   Leukocytes,Ua   (*)    Value: TEST NOT REPORTED DUE TO COLOR INTERFERENCE OF URINE PIGMENT   All other components within normal  limits  URINALYSIS, MICROSCOPIC (REFLEX) - Abnormal; Notable for the following components:   Bacteria, UA RARE (*)    Non Squamous Epithelial PRESENT (*)    All other components within normal limits  URINE CULTURE    EKG None  Radiology No results found.  Procedures Procedures (including critical care time)  Medications Ordered in ED Medications  cefTRIAXone (ROCEPHIN) 1 g in sodium chloride 0.9 % 100 mL IVPB (1 g Intravenous New Bag/Given 09/13/19 1434)    ED Course  I have reviewed the triage vital signs and the nursing notes.  Pertinent labs & imaging results that were available during my care of the patient were reviewed by me and considered in my medical decision making (see chart for details).    MDM Rules/Calculators/A&P                     Patient here for evaluation of hematuria that began today. UA is concerning for UTI with setting of numerous WBCs and rare bacteria. He is non-toxic appearing on evaluation. He is able to void still does not appear to be obstructed. Presentation is not consistent with kidney stone. Will start on antibiotics and send urine cultures. Discussed with patient home care for hematuria. Discussed return precautions if you develop any signs of worsening infection or obstruction.  Final Clinical Impression(s) / ED Diagnoses Final diagnoses:  Hemorrhagic cystitis    Rx / DC Orders ED Discharge Orders         Ordered  cefdinir (OMNICEF) 300 MG capsule  2 times daily     09/13/19 1422           Quintella Reichert, MD 09/13/19 1506

## 2019-09-13 NOTE — ED Notes (Signed)
Patient verbalizes understanding of discharge instructions. Opportunity for questioning and answers were provided. Armband removed by staff, pt discharged from ED to home with father °

## 2019-09-13 NOTE — ED Triage Notes (Signed)
Pt reports having some burning while peeing since yesterday and had blood in his urine today. States he called his cardiologist and was sent here due to recent surgery.

## 2019-09-14 ENCOUNTER — Telehealth: Payer: Self-pay | Admitting: Cardiology

## 2019-09-14 NOTE — Progress Notes (Signed)
Cardiology Office Note:    Date:  09/17/2019   ID:  Antonio Riley, DOB 1965/09/11, MRN [id number]  PCP:  Baldo Ash, FNP  Cardiologist:  Norman Herrlich, MD    Referring MD: Baldo Ash, FNP    ASSESSMENT:    1. Hypotension due to drugs   2. Urinary tract infection associated with indwelling urethral catheter, subsequent encounter   3. S/P aortic valve replacement with bioprosthetic valve + CABG x2   4. Hyperkalemia    PLAN:    In order of problems listed above:  1. Resolved secondary to overdiuresis ARB and urinary tract infection.  This time does not require Lokelma I will keep him off diuretics loop and distal and ARB blood pressure presently at target.  Recheck renal function potassium today 2. Improved continue and finish antibiotic recheck white count today 3. Stable enroll in cardiac rehabilitation 4. Recheck renal function potassium   Next appointment: 4 weeks   Medication Adjustments/Labs and Tests Ordered: Current medicines are reviewed at length with the patient today.  Concerns regarding medicines are outlined above.  No orders of the defined types were placed in this encounter.  No orders of the defined types were placed in this encounter.   Chief Complaint  Patient presents with  . Follow-up    In ED visit with urinary tract infection and hematuria  . Hypotension    History of Present Illness:    Antonio Riley is a 54 y.o. male with a hx of  hypertension bicuspid aortic valve recent aortic valve replacement  last seen 09/11/2019 with hypotension renal insufficiency with hyperkalemia. He was subsequently seen in the emergency room 09/13/2019 with hematuria and urinary tract infection white count elevated 16,700 hemoglobin 11.4.  Gram-negative rods are seen on culture and he had gross hematuria.  Serum sodium diminished 127 potassium 4.9 creatinine improved at 1.36 and GFR improved from 40 to 59 cc/min.  He was initiated on antibiotics for  outpatient treatment.  His urine culture grew E. coli resistant to sulfa IV Rocephin in the ED and oral cephalosporin at discharge. Compliance with diet, lifestyle and medications: Yes  Within 24 hours his urinary symptoms resolved he has had no fever chills he feels well he is just a little bit sleepy and attributes it to taking hypnotic and asked him to stop.  He never filled the prescription for [facility].  Blood pressure is at target at home and in the office I am not can restart loop diuretic or ARB.  I asked him to enroll in cardiac rehabilitation recheck a CBC for white count today along with BMP continue low-dose carvedilol and see back in the office in 4 weeks. Past Medical History:  Diagnosis Date  . Anginal pain (HCC)   . Aortic stenosis   . BMI 40.0-44.9, adult (HCC)   . Coronary artery disease   . Heart murmur   . Hypertension   . Knee pain 03/15/2018  . S/P aortic valve replacement with bioprosthetic valve 08/24/2019   25 mm Edwards Inspiris Resilia stented bovine pericardial tissue valve  . 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 Swelling of lower leg     Past Surgical History:  Procedure Laterality Date  . AORTIC VALVE REPLACEMENT N/A 08/24/2019   Procedure: AORTIC VALVE REPLACEMENT (AVR) USING INSPIRIS VALVE SIZE ;  Surgeon: Purcell Nails, MD;  Location: [facility] OR;  Service: Open Heart Surgery;  Laterality: N/A;  .  CORONARY ARTERY BYPASS GRAFT N/A 08/24/2019   Procedure: CORONARY ARTERY BYPASS GRAFTING (CABG) x two, using bilateral leg greater saphenous veins harvested endoscopically;  Surgeon: Rexene Alberts, MD;  Location: [place];  Service: Open Heart Surgery;  Laterality: N/A;  . FEMUR SURGERY    . RIGHT/LEFT HEART CATH AND CORONARY ANGIOGRAPHY N/A 07/31/2019   Procedure: RIGHT/LEFT HEART CATH AND CORONARY ANGIOGRAPHY;  Surgeon: Sherren Mocha, MD;  Location: [place] CV LAB;  Service: Cardiovascular;  Laterality: N/A;  . TEE  WITHOUT CARDIOVERSION N/A 08/24/2019   Procedure: TRANSESOPHAGEAL ECHOCARDIOGRAM (TEE);  Surgeon: Rexene Alberts, MD;  Location: [place];  Service: Open Heart Surgery;  Laterality: N/A;  . WRIST SURGERY      Current Medications: Current Meds  Medication Sig  . amiodarone (PACERONE) 200 MG tablet Take 1 tablet (200 mg total) by mouth 2 (two) times daily after a meal.  . aspirin EC 325 MG EC tablet Take 1 tablet (325 mg total) by mouth daily.  . Boswellia-Glucosamine-Vit D (OSTEO BI-FLEX ONE PER DAY PO) Take 1 tablet by mouth daily.   . Calcium Carbonate-Vitamin D (CALCIUM 600/VITAMIN D PO) Take 1 tablet by mouth daily.  . carvedilol (COREG) 12.5 MG tablet Take 1 tablet (12.5 mg total) by mouth 2 (two) times daily.  . cefdinir (OMNICEF) 300 MG capsule Take 1 capsule (300 mg total) by mouth 2 (two) times daily.  . ferrous [id number] C-folic acid (TRINSICON / FOLTRIN) capsule Take 1 capsule by mouth daily with breakfast.  . omeprazole (PRILOSEC OTC) 20 MG tablet Take 20 mg by mouth daily.  Marland Kitchen OVER THE COUNTER MEDICATION Take 1 tablet by mouth daily. Allergy Medicine  . potassium chloride 20 MEQ TBCR Take 20 mEq by mouth daily.  . rosuvastatin (CRESTOR) 20 MG tablet Take 1 tablet (20 mg total) by mouth daily.     Allergies:   Coricidin hbp cough-cold [chlorpheniramine-dm]   Social History   Socioeconomic History  . Marital status: Married    Spouse name: Not on file  . Number of children: Not on file  . Years of education: Not on file  . Highest education level: Not on file  Occupational History  . Not on file  Tobacco Use  . Smoking status: Former Smoker    Packs/day: 0.25    Years: 28.00    Pack years: 7.00    Types: Cigarettes    Quit date: 08/21/2019    Years since quitting: 0.0  . Smokeless tobacco: Never Used  . Tobacco comment: trying to quit  Substance and Sexual Activity  . Alcohol use: Yes    Comment: occ beer   . Drug use: Not Currently  . Sexual activity:  Not on file  Other Topics Concern  . Not on file  Social History Narrative  . Not on file   Social Determinants of Health   Financial Resource Strain:   . Difficulty of Paying Living Expenses:   Food Insecurity:   . Worried About Charity fundraiser in the Last Year:   . Arboriculturist in the Last Year:   Transportation Needs:   . Film/video editor (Medical):   Marland Kitchen Lack of Transportation (Non-Medical):   Physical Activity:   . Days of Exercise per Week:   . Minutes of Exercise per Session:   Stress:   . Feeling of Stress :   Social Connections:   . Frequency of Communication with Friends and Family:   . Frequency of Social  Gatherings with Friends and Family:   . Attends Religious Services:   . Active Member of Clubs or Organizations:   . Attends Banker Meetings:   Marland Kitchen Marital Status:      Family History: The patient's family history includes CAD in his mother; Deep vein thrombosis in his mother; Diabetes in his cousin and mother; Heart murmur in his mother. ROS:   Please see the history of present illness.    All other systems reviewed and are negative.  EKGs/Labs/Other Studies Reviewed:    The following studies were reviewed today:  EKG:  EKG ordered today and personally reviewed.  The ekg ordered today demonstrates sinus rhythm LVH repolarization  Recent Labs: 04/30/2019: NT-Pro BNP 86 08/25/2019: Magnesium 1.9 09/13/2019: ALT 27; BUN 19; Creatinine, Ser 1.36; Hemoglobin 11.4; Platelets 435; Potassium 4.9; Sodium 127  Recent Lipid Panel    Component Value Date/Time   CHOL 173 04/30/2019 1215   TRIG 117 04/30/2019 1215   HDL 45 04/30/2019 1215   CHOLHDL 3.8 04/30/2019 1215   LDLCALC 107 (H) 04/30/2019 1215    Physical Exam:    VS:  BP 132/88   Pulse 64   Temp (!) 97.1 F (36.2 C)   Ht 6\' 1"  (1.854 m)   Wt (!) 317 lb (143.8 kg)   SpO2 97%   BMI 41.82 kg/m     Wt Readings from Last 3 Encounters:  09/17/19 (!) 317 lb (143.8 kg)    09/13/19 (!) 310 lb (140.6 kg)  09/11/19 (!) 311 lb (141.1 kg)     GEN: Looks much better no longer acutely ill well nourished, well developed in no acute distress HEENT: Normal NECK: No JVD; No carotid bruits LYMPHATICS: No lymphadenopathy CARDIAC: No murmur no wound drainage RRR, no murmurs, rubs, gallops RESPIRATORY:  Clear to auscultation without rales, wheezing or rhonchi  ABDOMEN: Soft, non-tender, non-distended MUSCULOSKELETAL:  No edema; No deformity  SKIN: Warm and dry NEUROLOGIC:  Alert and oriented x 3 PSYCHIATRIC:  Normal affect    Signed, 09/13/19, MD  09/17/2019 9:05 AM    [facility]

## 2019-09-14 NOTE — Telephone Encounter (Signed)
Antonio Riley is calling stating he had blood work performed at the hospital yesterday and would like to know if he still needs to come to the office today to have it drawn again or if the results from yesterday can just be used. Please advise.

## 2019-09-15 LAB — URINE CULTURE: Culture: 50000 — AB

## 2019-09-16 ENCOUNTER — Telehealth: Payer: Self-pay | Admitting: Emergency Medicine

## 2019-09-16 NOTE — Telephone Encounter (Signed)
Post ED Visit - Positive Culture Follow-up  Culture report reviewed by antimicrobial stewardship pharmacist: [facility] []  , Pharm.D. []  Enzo Bi, Pharm.D., BCPS AQ-ID []  , Pharm.D., BCPS []  Celedonio Miyamoto, Pharm.D., BCPS []  [place], Garvin Fila.D., BCPS, AAHIVP []  , Pharm.D., BCPS, AAHIVP []  Georgina Pillion, PharmD, BCPS []  , PharmD, BCPS []  [place], PharmD, BCPS []  [place], PharmD []  , PharmD, BCPS [x]  Estella Husk, PharmD  Pharmacy Team []  Lysle Pearl, PharmD []  , PharmD []  Phillips Climes, PharmD []  , Rph []  Agapito Games) , PharmD []  Verlan Friends, PharmD []  , PharmD []  Mervyn Gay, PharmD []  , PharmD []  Vinnie Level, PharmD []  [facility], PharmD []  , PharmD []  Len Childs, PharmD   Positive urine culture Treated with Cefdinir, organism sensitive to the same and no further patient follow-up is required at this time.  Shenica Holzheimer 09/16/2019, 11:28 AM

## 2019-09-17 ENCOUNTER — Ambulatory Visit (INDEPENDENT_AMBULATORY_CARE_PROVIDER_SITE_OTHER): Payer: 59 | Admitting: Cardiology

## 2019-09-17 ENCOUNTER — Encounter: Payer: Self-pay | Admitting: Cardiology

## 2019-09-17 ENCOUNTER — Other Ambulatory Visit: Payer: Self-pay

## 2019-09-17 VITALS — BP 132/88 | HR 64 | Temp 97.1°F | Ht 73.0 in | Wt 317.0 lb

## 2019-09-17 DIAGNOSIS — E875 Hyperkalemia: Secondary | ICD-10-CM

## 2019-09-17 DIAGNOSIS — I952 Hypotension due to drugs: Secondary | ICD-10-CM

## 2019-09-17 DIAGNOSIS — T83511D Infection and inflammatory reaction due to indwelling urethral catheter, subsequent encounter: Secondary | ICD-10-CM | POA: Diagnosis not present

## 2019-09-17 DIAGNOSIS — Z953 Presence of xenogenic heart valve: Secondary | ICD-10-CM | POA: Diagnosis not present

## 2019-09-17 DIAGNOSIS — N390 Urinary tract infection, site not specified: Secondary | ICD-10-CM

## 2019-09-17 LAB — CBC
Hematocrit: 33.7 % — ABNORMAL LOW (ref 37.5–51.0)
Hemoglobin: 11 g/dL — ABNORMAL LOW (ref 13.0–17.7)
MCH: 28.7 pg (ref 26.6–33.0)
MCHC: 32.6 g/dL (ref 31.5–35.7)
MCV: 88 fL (ref 79–97)
Platelets: 438 10*3/uL (ref 150–450)
RBC: 3.83 x10E6/uL — ABNORMAL LOW (ref 4.14–5.80)
RDW: 12.8 % (ref 11.6–15.4)
WBC: 8.4 10*3/uL (ref 3.4–10.8)

## 2019-09-17 LAB — BASIC METABOLIC PANEL
BUN/Creatinine Ratio: 11 (ref 9–20)
BUN: 12 mg/dL (ref 6–24)
CO2: 22 mmol/L (ref 20–29)
Calcium: 9.3 mg/dL (ref 8.7–10.2)
Chloride: 101 mmol/L (ref 96–106)
Creatinine, Ser: 1.05 mg/dL (ref 0.76–1.27)
GFR calc Af Amer: 94 mL/min/{1.73_m2} (ref >59)
GFR calc non Af Amer: 81 mL/min/{1.73_m2} (ref >59)
Glucose: 109 mg/dL — ABNORMAL HIGH (ref 65–99)
Potassium: 5.6 mmol/L — ABNORMAL HIGH (ref 3.5–5.2)
Sodium: 136 mmol/L (ref 134–144)

## 2019-09-17 NOTE — Telephone Encounter (Signed)
Patient was seen in the office today 09/17/19 and labs were drawn.

## 2019-09-17 NOTE — Patient Instructions (Signed)
Medication Instructions:  Your physician has recommended you make the following change in your medication:  STOP: Lokelma STOP: Retoril *If you need a refill on your cardiac medications before your next appointment, please call your pharmacy*   Lab Work: Your physician recommends that you return for lab work in: TODAY CBC, BMP If you have labs (blood work) drawn today and your tests are completely normal, you will receive your results only by: Marland Kitchen MyChart Message (if you have MyChart) OR . A paper copy in the mail If you have any lab test that is abnormal or we need to change your treatment, we will call you to review the results.   Testing/Procedures: None   Follow-Up: At [facility], you and your health needs are our priority.  As part of our continuing mission to provide you with exceptional heart care, we have created designated Provider Care Teams.  These Care Teams include your primary Cardiologist (physician) and Advanced Practice Providers (APPs -  Physician Assistants and Nurse Practitioners) who all work together to provide you with the care you need, when you need it.  We recommend signing up for the patient portal called "MyChart".  Sign up information is provided on this After Visit Summary.  MyChart is used to connect with patients for Virtual Visits (Telemedicine).  Patients are able to view lab/test results, encounter notes, upcoming appointments, etc.  Non-urgent messages can be sent to your provider as well.   To learn more about what you can do with MyChart, go to [email or web address].    Your next appointment:   4 week(s)  The format for your next appointment:   In Person  Provider:   Norman Herrlich, MD   Other Instructions

## 2019-09-18 ENCOUNTER — Telehealth: Payer: Self-pay

## 2019-09-18 DIAGNOSIS — I10 Essential (primary) hypertension: Secondary | ICD-10-CM

## 2019-09-18 DIAGNOSIS — I2510 Atherosclerotic heart disease of native coronary artery without angina pectoris: Secondary | ICD-10-CM

## 2019-09-18 NOTE — Telephone Encounter (Signed)
Spoke with patient regarding results and recommendation.  Patient verbalizes understanding and is agreeable to plan of care. Advised patient to call back with any issues or concerns.  

## 2019-09-18 NOTE — Telephone Encounter (Signed)
-----   Message from Baldo Daub, MD sent at 09/18/2019  8:36 AM EDT ----- Normal or stable result  All good except his potassium is elevated please have him stop supplements in about 1 week recheck a BMP.

## 2019-09-20 ENCOUNTER — Other Ambulatory Visit: Payer: Self-pay | Admitting: Thoracic Surgery (Cardiothoracic Vascular Surgery)

## 2019-09-20 ENCOUNTER — Other Ambulatory Visit: Payer: Self-pay | Admitting: Surgical

## 2019-09-20 ENCOUNTER — Other Ambulatory Visit: Payer: Self-pay | Admitting: Cardiology

## 2019-09-20 DIAGNOSIS — Z951 Presence of aortocoronary bypass graft: Secondary | ICD-10-CM

## 2019-09-24 ENCOUNTER — Ambulatory Visit: Payer: 59 | Admitting: Thoracic Surgery (Cardiothoracic Vascular Surgery)

## 2019-09-25 LAB — BASIC METABOLIC PANEL
BUN/Creatinine Ratio: 12 (ref 9–20)
BUN: 12 mg/dL (ref 6–24)
CO2: 22 mmol/L (ref 20–29)
Calcium: 9 mg/dL (ref 8.7–10.2)
Chloride: 103 mmol/L (ref 96–106)
Creatinine, Ser: 1.03 mg/dL (ref 0.76–1.27)
GFR calc Af Amer: 96 mL/min/{1.73_m2} (ref >59)
GFR calc non Af Amer: 83 mL/min/{1.73_m2} (ref >59)
Glucose: 110 mg/dL — ABNORMAL HIGH (ref 65–99)
Potassium: 4.8 mmol/L (ref 3.5–5.2)
Sodium: 140 mmol/L (ref 134–144)

## 2019-09-26 ENCOUNTER — Telehealth: Payer: Self-pay

## 2019-09-26 NOTE — Telephone Encounter (Signed)
Spoke with patient regarding results and recommendation.  Patient verbalizes understanding and is agreeable to plan of care. Advised patient to call back with any issues or concerns.  

## 2019-09-26 NOTE — Telephone Encounter (Signed)
-----   Message from Thomasene Ripple, DO sent at 09/26/2019  8:05 AM EDT ----- Labs stable,please forward copy to PCP.

## 2019-09-27 ENCOUNTER — Ambulatory Visit
Admission: RE | Admit: 2019-09-27 | Discharge: 2019-09-27 | Disposition: A | Discharge status: Home / Self Care | Payer: 59 | Source: Ambulatory Visit | Attending: Thoracic Surgery (Cardiothoracic Vascular Surgery) | Admitting: Thoracic Surgery (Cardiothoracic Vascular Surgery)

## 2019-09-27 ENCOUNTER — Ambulatory Visit (INDEPENDENT_AMBULATORY_CARE_PROVIDER_SITE_OTHER): Payer: Self-pay | Admitting: Physician Assistant

## 2019-09-27 ENCOUNTER — Encounter: Payer: Self-pay | Admitting: Physician Assistant

## 2019-09-27 ENCOUNTER — Other Ambulatory Visit: Payer: Self-pay

## 2019-09-27 VITALS — BP 145/83 | HR 60 | Temp 97.9°F | Resp 20 | Ht 73.0 in | Wt 331.0 lb

## 2019-09-27 DIAGNOSIS — Z951 Presence of aortocoronary bypass graft: Secondary | ICD-10-CM

## 2019-09-27 DIAGNOSIS — I350 Nonrheumatic aortic (valve) stenosis: Secondary | ICD-10-CM

## 2019-09-27 DIAGNOSIS — I25709 Atherosclerosis of coronary artery bypass graft(s), unspecified, with unspecified angina pectoris: Secondary | ICD-10-CM

## 2019-09-27 DIAGNOSIS — Z952 Presence of prosthetic heart valve: Secondary | ICD-10-CM

## 2019-09-27 NOTE — Patient Instructions (Signed)
Make every effort to stay physically active, get some type of exercise on a regular basis, and stick to a "heart healthy diet".  The long term benefits for regular exercise and a healthy diet are critically important to your overall health and wellbeing.  You are encouraged to enroll and participate in the outpatient cardiac rehab program beginning as soon as practical.  You may return to driving an automobile as long as you are no longer requiring oral narcotic pain relievers during the daytime.  It would be wise to start driving only short distances during the daylight and gradually increase from there as you feel comfortable.  Endocarditis is a potentially serious infection of heart valves or inside lining of the heart.  It occurs more commonly in patients with diseased heart valves (such as patient's with aortic or mitral valve disease) and in patients who have undergone heart valve repair or replacement.  Certain surgical and dental procedures may put you at risk, such as dental cleaning, other dental procedures, or any surgery involving the respiratory, urinary, gastrointestinal tract, gallbladder or prostate gland.   To minimize your chances for develooping endocarditis, maintain good oral health and seek prompt medical attention for any infections involving the mouth, teeth, gums, skin or urinary tract.    Always notify your doctor or dentist about your underlying heart valve condition before having any invasive procedures. You will need to take antibiotics before certain procedures, including all routine dental cleanings or other dental procedures.  Your cardiologist or dentist should prescribe these antibiotics for you to be taken ahead of time.      

## 2019-09-27 NOTE — Progress Notes (Addendum)
[address]       [place] [address]             [phone]      Antonio Riley is a 54 y.o. male patient status post AVR and coronary bypass grafting x2 on 08/24/2019 with Dr. Roxy Manns.  His hospital stay was complicated by atrial fibrillation on postop day 2. We initiated Amiodarone with resolution. He returned to the office today for his routine post-op visit.  The patient saw Dr. Bettina Gavia his cardiologist earlier this month due to a hypotensive episode.  He was on several blood pressure medicines in addition to some diuretics.  At this time Dr. Bettina Gavia discontinued most of his medications and scheduled a follow-up appointment which is early June.   1. S/P CABG x 2   2. S/P AVR (aortic valve replacement)   3. Severe aortic stenosis   4. Coronary artery disease involving coronary bypass graft of native heart with angina pectoris [facility])    Past Medical History:  Diagnosis Date  . Anginal pain ([place])   . Aortic stenosis   . BMI 40.0-44.9, adult ([place])   . Coronary artery disease   . Heart murmur   . Hypertension   . Knee pain 03/15/2018  . S/P aortic valve replacement with bioprosthetic valve 08/24/2019   25 mm Edwards Inspiris Resilia stented bovine pericardial tissue valve  . 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 Swelling of lower leg    No past surgical history pertinent negatives on file.    Scheduled Meds: Current Outpatient Medications on File Prior to Visit  Medication Sig Dispense Refill  . amiodarone (PACERONE) 200 MG tablet Take 1 tablet (200 mg total) by mouth 2 (two) times daily after a meal. 60 tablet 1  . amoxicillin (AMOXIL) 500 MG capsule Take 4 capsules (2,000 mg) 45 minutes before dental procedures. 20 capsule 0  . aspirin EC 325 MG EC tablet Take 1 tablet (325 mg total) by mouth daily.    . [place]well[place]-Glucosamine-Vit D (OSTEO BI-FLEX ONE PER DAY PO) Take 1 tablet by mouth daily.     . Calcium  Carbonate-Vitamin D (CALCIUM 600/VITAMIN D PO) Take 1 tablet by mouth daily.    . carvedilol (COREG) 12.5 MG tablet Take 1 tablet (12.5 mg total) by mouth 2 (two) times daily. 180 tablet 3  . ferrous [id number] C-folic acid (TRINSICON / FOLTRIN) capsule Take 1 capsule by mouth daily with breakfast. 30 capsule 2  . omeprazole (PRILOSEC OTC) 20 MG tablet Take 20 mg by mouth daily.    Marland Kitchen OVER THE COUNTER MEDICATION Take 1 tablet by mouth daily. Allergy Medicine    . rosuvastatin (CRESTOR) 20 MG tablet Take 1 tablet (20 mg total) by mouth daily. 30 tablet 1   No current facility-administered medications on file prior to visit.    Allergies  Allergen Reactions  . Coricidin Hbp Cough-Cold [Chlorpheniramine-Dm] Hives   Active Problems:   * No active hospital problems. *  Blood pressure (!) 145/83, pulse 60, temperature 97.9 F (36.6 C), temperature source Temporal, resp. rate 20, height 6\' 1"  (1.854 m), weight (!) 331 lb (150.1 kg), SpO2 98 %.  Subjective   The patient presents to the office for his routine 4 week follow-up appointment s/p CABG x 2 and AVR by Dr. Roxy Manns.  The patient today feels wonderful.  He states that he has not felt  this good in years.  He did discuss with me his hypotensive episode in which he saw Dr. Dulce Sellar about 2 weeks ago and was taken off all his blood pressure medicines and off of his diuretics.  Today he does have some 1-2+ pitting edema in his lower extremity bilaterally and his blood pressure is elevated.  He states that at home his blood pressure averages about 150 systolic.  Objective  Cor: Regular rate and rhythm, no murmur Pulmonary: Clear to auscultation in all fields Abdomen: No tenderness Incision: Incision is well-healed however there is some edema present along the right and left border.  No drainage. Extremities: The patient does have 1-2+ pitting edema in his bilateral lower extremities   CLINICAL DATA:  Previous CABG.  No current  complaints.  EXAM: CHEST - 2 VIEW  COMPARISON:  08/28/2019  FINDINGS: Previous CABG markers. Previous aortic valve replacement. Mild chronic cardiac enlargement. No other mediastinal finding. Pulmonary vascularity is normal. The lungs are clear. Minimal blunting of the posterior costophrenic angles could be scarring or tiny amount of pleural fluid.  IMPRESSION: No active disease. Minimal blunting of the posterior costophrenic angles could be mild scarring or a tiny amount of pleural fluid.   Electronically Signed   By: Paulina Fusi M.D.   On: 09/27/2019 15:24    Assessment & Plan   Today, overall the patient is doing very well.  He is walking several times a day and is starting to build up his aerobic stamina.  He does have a concern about his edema in his lower extremities.  I do believe that he will need some diuretic therapy, however since Dr. Dulce Sellar discontinued these medications I will refer to him for reinitiation.  Also, I do believe that he likely will need an ACE or an ARB added to his blood pressure regimen.  He was likely on too much medication initially but now is not on quite enough.  I discussed with the patient potential options and we both agreed that contacting Dr. Dulce Sellar for titration is appropriate.  The patient is going to call Dr. Dulce Sellar tomorrow to schedule an appointment within 1 to 2 weeks for medication adjustment.  We also discussed weight management and since the patient is feeling much better now after surgery he is going to enroll in a rehab program close to [place] which is home.  He asked about returning to work and since his job is sedentary I stated it would be okay to return to work after the 6-week point.  I suggested starting part-time perhaps only 2 to 3 days a week for half day and continuing this regimen for 2 to 3 weeks before returning to full-time.  He states that his job is flexible and he is able to do this.  He is cleared to drive today  since he is no longer requiring narcotic pain medication and has not taken any for 3 weeks.  His pain has been well controlled, however I did reinforce sternal precautions since he did have some sternal swelling on exam today.  He is still limited to no pushing or pulling movements and to keep to a 10 pound weight limit with his upper extremities.   The patient is advised to weigh himself every morning after using the bathroom and before breakfast and keep track of this weight.  He also is asked to take his blood pressure several times a day and track this as well so that we may titrate his medications.  He is motivated to lose some weight since over the last 10 years or so he has put on about 100 pounds.  Now that he has a well working aortic valve and fresh blood flowing to his heart I think this is a fresh start for the patient and definitely provided some motivation.   Since the patient has seemingly been in normal sinus rhythm for the last few weeks I decreased his amiodarone 200 mg twice daily to 200 mg daily.  I will defer to Dr. Dulce Sellar for titration of his diuretics and blood pressure medications.  He is planning to call his office tomorrow and arrange for an appointment within 1 to 2 weeks.  He will be scheduled to see our office in 2 months with Dr. Cornelius Moras.  If there are any changes to his incision or needs to see Dr. Cornelius Moras or a PA before his scheduled appointment he is to call our office.   Sharlene Dory 09/27/2019   Addendum: Spoke with Mr. Toney on the phone and asked how he was doing. His legs are about the same with his swelling and his BP is up a little (around 160 systolic according to his home device). He states his sternal incision is less swollen and his distal rash is improving. I reminded him he can put some hydrocortisone cream (cordizone 10 cream) around his incision but not on his incision. He states that his hands have some dry spots on them which is probably from washing them several  times a day. He is advised to try some Vaseline at night with gloves which will help moisturize. I doubt dry hands is a reaction to any medications he is on.  He was advised by cardiology to not make any medication adjustments right now but does have an appointment on Friday 5/21 to see Dr. Dulce Sellar. I suspect they will start him on a low-dose diuretic and possibly a low-dose ACE/ARB for his BP. I will follow-up again on Friday. Encouraged him to call our office for additional questions/concerns regarding his incision.  Jari Favre, PA-C 10/02/2019

## 2019-09-28 ENCOUNTER — Telehealth: Payer: Self-pay | Admitting: Cardiology

## 2019-09-28 NOTE — Telephone Encounter (Signed)
I spoke with the patient just now. He let me know that during his office visit with the surgeon yesterday his BP was 145/83. He also states that he has been having some swelling in his calves and feet and the surgeon mentioned to him that he may need a diuretic for this. He has an appointment on 10/05/2019 but was wanting to know if there was anything that he could be given for the swelling in the mean time.   I will route this to Dr. Servando Salina for further advise.    Encouraged patient to call back with any questions or concerns.

## 2019-09-28 NOTE — Telephone Encounter (Signed)
Spoke with the patient just now and let him know that Dr. Servando Salina said that she would not make any changes to his medications right now but we will reassess at his follow up visit next week.

## 2019-09-28 NOTE — Telephone Encounter (Signed)
Mew Message"     Pt said he saw his surgeon yesterday. He told him that his blood pressure was up and his his feet and legs are swelling. He told him to follow up with Cardiologist before his scheduled appt on 10-23-19. I made him an appt for 10-05-19, please call to evaluate

## 2019-10-01 ENCOUNTER — Encounter (HOSPITAL_COMMUNITY): Payer: Self-pay

## 2019-10-01 ENCOUNTER — Telehealth (HOSPITAL_COMMUNITY): Payer: Self-pay

## 2019-10-01 NOTE — Telephone Encounter (Signed)
Attempted to call patient in regards to Cardiac Rehab - LM on VM Mailed letter 

## 2019-10-05 ENCOUNTER — Ambulatory Visit (INDEPENDENT_AMBULATORY_CARE_PROVIDER_SITE_OTHER): Payer: 59 | Admitting: Cardiology

## 2019-10-05 ENCOUNTER — Encounter: Payer: Self-pay | Admitting: Cardiology

## 2019-10-05 ENCOUNTER — Other Ambulatory Visit: Payer: Self-pay

## 2019-10-05 VITALS — BP 174/102 | HR 86 | Ht 73.0 in | Wt 335.2 lb

## 2019-10-05 DIAGNOSIS — I952 Hypotension due to drugs: Secondary | ICD-10-CM | POA: Diagnosis not present

## 2019-10-05 DIAGNOSIS — E7841 Elevated Lipoprotein(a): Secondary | ICD-10-CM | POA: Diagnosis not present

## 2019-10-05 DIAGNOSIS — Z953 Presence of xenogenic heart valve: Secondary | ICD-10-CM | POA: Diagnosis not present

## 2019-10-05 MED ORDER — FUROSEMIDE 20 MG PO TABS: 20.0000 mg | ORAL_TABLET | ORAL | 3 refills | Status: DC

## 2019-10-05 MED ORDER — LOSARTAN POTASSIUM 50 MG PO TABS: 50.0000 mg | ORAL_TABLET | Freq: Every day | ORAL | 3 refills | Status: DC

## 2019-10-05 NOTE — Progress Notes (Signed)
Cardiology Office Note:    Date:  10/05/2019   ID:  Antonio Riley, DOB [birth year], MRN [id number]  PCP:  Sarajane Jews, FNP  Cardiologist:  Shirlee More, MD    Referring MD: Sarajane Jews, FNP    ASSESSMENT:    1. S/P aortic valve replacement with bioprosthetic valve + CABG x2   2. Hypotension due to drugs   3. Elevated lipoprotein A level    PLAN:    In order of problems listed above:  1. He continues to improve and is recovered from his open heart surgery is going to return to work Monday.  He has had a complicated course I think he can stop his amiodarone EKG 2 weeks ago showed sinus rhythm we will do an EKG next office visit.  I think the problem now is fluid overload from his postoperative state I put him on a low-dose of a loop diuretic and put him on a low-dose of ARB his blood pressure cuff at target.  He will continue his high intensity statin and recheck his lipids and lipoprotein a he is scheduled to see me 11/26/2019 for follow-up regarding postoperative atrial fibrillation hypertension edema and dyslipidemia.  I reviewed medications before leaving the office as well as the note from cardiothoracic surgery I directed him back to my office with his peripheral edema. 2. He also be scheduled for his postoperative echocardiogram is baseline.   Next appointment: November 26, 2019   Medication Adjustments/Labs and Tests Ordered: Current medicines are reviewed at length with the patient today.  Concerns regarding medicines are outlined above.  No orders of the defined types were placed in this encounter.  No orders of the defined types were placed in this encounter.   Chief Complaint  Patient presents with  . Follow-up    4 WK FU     History of Present Illness:    Antonio Riley is a 54 y.o. male with a hx of hypertension bicuspid aortic valve recent aortic valve replacement  last seen 09/11/2019 with hypotension renal insufficiency with hyperkalemia. He  was subsequently seen in the emergency room 09/13/2019 with hematuria and urinary tract infection white count elevated 16,700 hemoglobin 11.4.  Gram-negative rods are seen on culture and he had gross hematuria.  Serum sodium diminished 127 potassium 4.9 creatinine improved at 1.36 and GFR improved from 40 to 59 cc/min.  He was initiated on antibiotics for outpatient treatment.  His urine culture grew E. coli resistant to sulfa IV Rocephin in the ED and oral cephalosporin at discharge.  He also had brief postoperative atrial fibrillation and is on amiodarone. He was last seen 09/17/2019. Compliance with diet, lifestyle and medications: Yes  He feels improved he is quite a bit of work Monday no angina dyspnea palpitations syncope but has developed peripheral edema blood pressure is elevated and wants to avoid ACE inhibitors they gave him erectile dysfunction in the past.  Reviewed the notes from CT surgery and they decrease his amiodarone to once daily.  I asked him to be seen by me earlier because the edema.  He still needs his postoperative echocardiogram ordered started on a loop diuretic low-dose of ARB and plan to recheck labs including renal function potassium lipids and LP(a) level. Past Medical History:  Diagnosis Date  . Anginal pain ([place])   . Aortic stenosis   . BMI 40.0-44.9, adult ([place])   . Coronary artery disease   . Heart murmur   . Hypertension   . Knee  pain 03/15/2018  . S/P aortic valve replacement with bioprosthetic valve 08/24/2019   25 mm Edwards Inspiris Resilia stented bovine pericardial tissue valve  . 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 Swelling of lower leg     Past Surgical History:  Procedure Laterality Date  . AORTIC VALVE REPLACEMENT N/A 08/24/2019   Procedure: AORTIC VALVE REPLACEMENT (AVR) USING INSPIRIS VALVE SIZE ;  Surgeon: Purcell Nails, MD;  Location: [facility] OR;  Service: Open Heart Surgery;  Laterality: N/A;  .  CORONARY ARTERY BYPASS GRAFT N/A 08/24/2019   Procedure: CORONARY ARTERY BYPASS GRAFTING (CABG) x two, using bilateral leg greater saphenous veins harvested endoscopically;  Surgeon: Purcell Nails, MD;  Location: [facility] OR;  Service: Open Heart Surgery;  Laterality: N/A;  . FEMUR SURGERY    . RIGHT/LEFT HEART CATH AND CORONARY ANGIOGRAPHY N/A 07/31/2019   Procedure: RIGHT/LEFT HEART CATH AND CORONARY ANGIOGRAPHY;  Surgeon: Tonny Bollman, MD;  Location: [facility]VASIVE CV LAB;  Service: Cardiovascular;  Laterality: N/A;  . TEE WITHOUT CARDIOVERSION N/A 08/24/2019   Procedure: TRANSESOPHAGEAL ECHOCARDIOGRAM (TEE);  Surgeon: Purcell Nails, MD;  Location: [facility] OR;  Service: Open Heart Surgery;  Laterality: N/A;  . WRIST SURGERY      Current Medications: Current Meds  Medication Sig  . amiodarone (PACERONE) 200 MG tablet Take 1 tablet (200 mg total) by mouth 2 (two) times daily after a meal. (Patient taking differently: Take 200 mg by mouth daily. )  . aspirin EC 325 MG EC tablet Take 1 tablet (325 mg total) by mouth daily.  . [place]-Glucosamine-Vit D (OSTEO BI-FLEX ONE PER DAY PO) Take 1 tablet by mouth daily.   . Calcium Carbonate-Vitamin D (CALCIUM 600/VITAMIN D PO) Take 1 tablet by mouth daily.  . carvedilol (COREG) 12.5 MG tablet Take 1 tablet (12.5 mg total) by mouth 2 (two) times daily.  . ferrous fumarate-b12-vitamic C-folic acid (TRINSICON / FOLTRIN) capsule Take 1 capsule by mouth daily with breakfast.  . omeprazole (PRILOSEC OTC) 20 MG tablet Take 20 mg by mouth daily.  Marland Kitchen OVER THE COUNTER MEDICATION Take 1 tablet by mouth daily. Allergy Medicine  . rosuvastatin (CRESTOR) 20 MG tablet Take 1 tablet (20 mg total) by mouth daily.     Allergies:   Coricidin hbp cough-cold [chlorpheniramine-dm]   Social History   Socioeconomic History  . Marital status: Married    Spouse name: Not on file  . Number of children: Not on file  . Years of education: Not on file  . Highest education level:  Not on file  Occupational History  . Not on file  Tobacco Use  . Smoking status: Former Smoker    Packs/day: 0.25    Years: 28.00    Pack years: 7.00    Types: Cigarettes    Quit date: 08/21/2019    Years since quitting: 0.1  . Smokeless tobacco: Never Used  . Tobacco comment: trying to quit  Substance and Sexual Activity  . Alcohol use: Yes    Comment: occ beer   . Drug use: Not Currently  . Sexual activity: Not on file  Other Topics Concern  . Not on file  Social History Narrative  . Not on file   Social Determinants of Health   Financial Resource Strain:   . Difficulty of Paying Living Expenses:   Food Insecurity:   . Worried About Programme researcher, broadcasting/film/video in the Last Year:   .  Ran Out of Food in the Last Year:   Transportation Needs:   . Freight forwarder (Medical):   Marland Kitchen Lack of Transportation (Non-Medical):   Physical Activity:   . Days of Exercise per Week:   . Minutes of Exercise per Session:   Stress:   . Feeling of Stress :   Social Connections:   . Frequency of Communication with Friends and Family:   . Frequency of Social Gatherings with Friends and Family:   . Attends Religious Services:   . Active Member of Clubs or Organizations:   . Attends Banker Meetings:   Marland Kitchen Marital Status:      Family History: The patient's family history includes CAD in his mother; Deep vein thrombosis in his mother; Diabetes in his cousin and mother; Heart murmur in his mother. ROS:   Please see the history of present illness.    All other systems reviewed and are negative.  EKGs/Labs/Other Studies Reviewed:    The followin 2+ bilateral lower extremity pittingstudies were reviewed today:    Recent Labs: 04/30/2019: NT-Pro BNP 86 08/25/2019: Magnesium 1.9 09/13/2019: ALT 27 09/17/2019: Hemoglobin 11.0; Platelets 438 09/25/2019: BUN 12; Creatinine, Ser 1.03; Potassium 4.8; Sodium 140  Recent Lipid Panel    Component Value Date/Time   CHOL 173 04/30/2019 1215    TRIG 117 04/30/2019 1215   HDL 45 04/30/2019 1215   CHOLHDL 3.8 04/30/2019 1215   LDLCALC 107 (H) 04/30/2019 1215    Physical Exam:    VS:  BP (!) 174/102   Pulse 86   Ht 6\' 1"  (1.854 m)   Wt (!) 335 lb 3.2 oz (152 kg)   SpO2 98%   BMI 44.22 kg/m     Wt Readings from Last 3 Encounters:  10/05/19 (!) 335 lb 3.2 oz (152 kg)  09/27/19 (!) 331 lb (150.1 kg)  09/17/19 (!) 317 lb (143.8 kg)     GEN:  Well nourished, well developed in no acute distress HEENT: Normal NECK: No JVD; No carotid bruits LYMPHATICS: No lymphadenopathy CARDIAC: RRR, no murmurs, rubs, gallops RESPIRATORY:  Clear to auscultation without rales, wheezing or rhonchi  ABDOMEN: Soft, non-tender, non-distended MUSCULOSKELETAL: He has 2+ bilateral lower extremity pitting edema; No deformity  SKIN: Warm and dry NEUROLOGIC:  Alert and oriented x 3 PSYCHIATRIC:  Normal affect    Signed, 11/17/19, MD  10/05/2019 2:53 PM    [facility]

## 2019-10-05 NOTE — Patient Instructions (Addendum)
Medication Instructions:  Your physician has recommended you make the following change in your medication:  START: Cozaar 50 mg take one tablet by mouth daily.  START: Furosemide 20 mg take one tablet by mouth daily for one week. Then take three times weekly on Monday, Wednesday, and Friday. STOP: Amiodarone *If you need a refill on your cardiac medications before your next appointment, please call your pharmacy*   Lab Work: Your physician recommends that you return for lab work in: Two weeks If you have labs (blood work) drawn today and your tests are completely normal, you will receive your results only by: Marland Kitchen MyChart Message (if you have MyChart) OR . A paper copy in the mail If you have any lab test that is abnormal or we need to change your treatment, we will call you to review the results.   Testing/Procedures: Your physician has requested that you have an echocardiogram. Echocardiography is a painless test that uses sound waves to create images of your heart. It provides your doctor with information about the size and shape of your heart and how well your heart's chambers and valves are working. This procedure takes approximately one hour. There are no restrictions for this procedure.     Follow-Up: At [facility], you and your health needs are our priority.  As part of our continuing mission to provide you with exceptional heart care, we have created designated Provider Care Teams.  These Care Teams include your primary Cardiologist (physician) and Advanced Practice Providers (APPs -  Physician Assistants and Nurse Practitioners) who all work together to provide you with the care you need, when you need it.  We recommend signing up for the patient portal called "MyChart".  Sign up information is provided on this After Visit Summary.  MyChart is used to connect with patients for Virtual Visits (Telemedicine).  Patients are able to view lab/test results, encounter notes, upcoming  appointments, etc.  Non-urgent messages can be sent to your provider as well.   To learn more about what you can do with MyChart, go to [email or web address].    Your next appointment:   Keep appointment scheduled in July  The format for your next appointment:   In Person  Provider:   Norman Herrlich, MD   Other Instructions

## 2019-10-10 ENCOUNTER — Telehealth: Payer: Self-pay | Admitting: Physician Assistant

## 2019-10-10 NOTE — Telephone Encounter (Signed)
      [address]       [name] [address]             [phone]       Spoke with Mr. Reasons this afternoon regarding his recent cardiology visit and general well-being.  He states that he is doing very well and feels better than he has in years.  Dr. Dulce Sellar started him on 20 of Lasix and losartan daily for his blood pressure and fluid overload.  He states that his lower extremity edema is improving but not yet gone.  His blood pressure has been trending down but is still high at times.  He has been walking daily and has been losing weight.  He decided not to do the cardiac pulmonary rehab program since he attended the evaluation and felt he could do the exercises at home.  He did start working part-time this week for a few hours in the mornings and has tolerated this well.  He stays active at home and has noticed his shortness of breath is still there but improving.  His distal rash along his incision is almost gone with the use of cortisone 10 cream occasionally.  He knows about his follow-up appointment with Dr. Cornelius Moras and plans to attend.  He knows to call our office if he needs our assistance before his scheduled appointment.   Jari Favre, PA-C

## 2019-10-11 NOTE — Telephone Encounter (Signed)
Refer to other telephone encounter note.

## 2019-10-14 ENCOUNTER — Other Ambulatory Visit: Payer: Self-pay | Admitting: Surgical

## 2019-10-16 ENCOUNTER — Telehealth (HOSPITAL_COMMUNITY): Payer: Self-pay

## 2019-10-16 NOTE — Telephone Encounter (Signed)
No response from pt regarding CR.  Closed referral.  

## 2019-10-23 ENCOUNTER — Ambulatory Visit: Payer: 59 | Admitting: Cardiology

## 2019-10-25 ENCOUNTER — Ambulatory Visit (INDEPENDENT_AMBULATORY_CARE_PROVIDER_SITE_OTHER): Payer: 59

## 2019-10-25 ENCOUNTER — Other Ambulatory Visit: Payer: Self-pay

## 2019-10-25 ENCOUNTER — Telehealth: Payer: Self-pay

## 2019-10-25 DIAGNOSIS — Z953 Presence of xenogenic heart valve: Secondary | ICD-10-CM | POA: Diagnosis not present

## 2019-10-25 DIAGNOSIS — I952 Hypotension due to drugs: Secondary | ICD-10-CM | POA: Diagnosis not present

## 2019-10-25 DIAGNOSIS — E7841 Elevated Lipoprotein(a): Secondary | ICD-10-CM

## 2019-10-25 NOTE — Telephone Encounter (Signed)
Spoke with patient regarding results and recommendation.  Patient verbalizes understanding and is agreeable to plan of care. Advised patient to call back with any issues or concerns.  

## 2019-10-25 NOTE — Telephone Encounter (Signed)
Left message on patients voicemail to please return our call.   

## 2019-10-25 NOTE — Progress Notes (Signed)
Complete echocardiogram has been performed.  Artur Amaree Loisel RDCS, RVT 

## 2019-10-25 NOTE — Telephone Encounter (Signed)
-----   Message from Baldo Daub, MD sent at 10/25/2019  1:48 PM EDT ----- Normal or stable result  Good result this is his baseline echo after aortic valve replacement

## 2019-10-26 ENCOUNTER — Other Ambulatory Visit: Payer: Self-pay | Admitting: Cardiology

## 2019-10-27 LAB — LIPID PANEL
Chol/HDL Ratio: 2.8 ratio (ref 0.0–5.0)
Cholesterol, Total: 118 mg/dL (ref 100–199)
HDL: 42 mg/dL (ref >39)
LDL Chol Calc (NIH): 59 mg/dL (ref 0–99)
Triglycerides: 85 mg/dL (ref 0–149)
VLDL Cholesterol Cal: 17 mg/dL (ref 5–40)

## 2019-10-27 LAB — BASIC METABOLIC PANEL
BUN/Creatinine Ratio: 15 (ref 9–20)
BUN: 15 mg/dL (ref 6–24)
CO2: 24 mmol/L (ref 20–29)
Calcium: 9.4 mg/dL (ref 8.7–10.2)
Chloride: 102 mmol/L (ref 96–106)
Creatinine, Ser: 0.98 mg/dL (ref 0.76–1.27)
GFR calc Af Amer: 102 mL/min/{1.73_m2} (ref >59)
GFR calc non Af Amer: 88 mL/min/{1.73_m2} (ref >59)
Glucose: 120 mg/dL — ABNORMAL HIGH (ref 65–99)
Potassium: 4.8 mmol/L (ref 3.5–5.2)
Sodium: 139 mmol/L (ref 134–144)

## 2019-10-27 LAB — LIPOPROTEIN A (LPA): Lipoprotein (a): 91 nmol/L — ABNORMAL HIGH (ref <75.0)

## 2019-10-29 ENCOUNTER — Telehealth: Payer: Self-pay

## 2019-10-29 NOTE — Telephone Encounter (Signed)
-----   Message from Baldo Daub, MD sent at 10/27/2019 12:51 PM EDT ----- Normal or stable result  Good result no changes

## 2019-10-29 NOTE — Telephone Encounter (Signed)
Left message on patients voicemail to please return our call.   

## 2019-10-30 NOTE — Telephone Encounter (Signed)
Spoke with patient regarding results and recommendation.  Patient verbalizes understanding and is agreeable to plan of care. Advised patient to call back with any issues or concerns.  

## 2019-10-31 ENCOUNTER — Other Ambulatory Visit: Payer: Self-pay | Admitting: Surgical

## 2019-11-22 ENCOUNTER — Other Ambulatory Visit: Payer: Self-pay | Admitting: Surgical

## 2019-11-25 ENCOUNTER — Other Ambulatory Visit: Payer: Self-pay | Admitting: Surgical

## 2019-11-26 ENCOUNTER — Encounter: Payer: 59 | Admitting: Thoracic Surgery (Cardiothoracic Vascular Surgery)

## 2019-11-26 ENCOUNTER — Other Ambulatory Visit: Payer: Self-pay | Admitting: Surgical

## 2019-11-27 ENCOUNTER — Other Ambulatory Visit: Payer: Self-pay | Admitting: Cardiology

## 2019-11-27 ENCOUNTER — Telehealth: Payer: Self-pay | Admitting: Cardiology

## 2019-11-27 MED ORDER — ROSUVASTATIN CALCIUM 20 MG PO TABS: 20.0000 mg | ORAL_TABLET | Freq: Every day | ORAL | 3 refills | Status: DC

## 2019-11-27 NOTE — Telephone Encounter (Signed)
Patient is requesting he be emailed a list of his medications to [email or web address]. He states that he does have MyChart, but his medication list does not appear to be correct.

## 2019-11-27 NOTE — Telephone Encounter (Signed)
Left message on patients voicemail to please return our call.   

## 2019-11-27 NOTE — Telephone Encounter (Signed)
Refill sent in per request.  

## 2019-11-27 NOTE — Telephone Encounter (Signed)
°*  STAT* If patient is at the pharmacy, call can be transferred to refill team.   1. Which medications need to be refilled? (please list name of each medication and dose if known)? rosuvastatin (CRESTOR) 20 MG tablet  2. Which pharmacy/location (including street and city if local pharmacy) is medication to be sent to? [id number] - [place], [place] - [address]  3. Do they need a 30 day or 90 day supply? 90 days

## 2019-12-03 ENCOUNTER — Ambulatory Visit: Payer: 59 | Admitting: Cardiology

## 2019-12-10 ENCOUNTER — Other Ambulatory Visit: Payer: Self-pay

## 2019-12-10 ENCOUNTER — Encounter: Payer: Self-pay | Admitting: Thoracic Surgery (Cardiothoracic Vascular Surgery)

## 2019-12-10 ENCOUNTER — Ambulatory Visit (INDEPENDENT_AMBULATORY_CARE_PROVIDER_SITE_OTHER): Payer: 59 | Admitting: Thoracic Surgery (Cardiothoracic Vascular Surgery)

## 2019-12-10 VITALS — BP 141/94 | HR 64 | Temp 97.7°F | Resp 20 | Ht 74.0 in | Wt 336.0 lb

## 2019-12-10 DIAGNOSIS — Z953 Presence of xenogenic heart valve: Secondary | ICD-10-CM

## 2019-12-10 DIAGNOSIS — Z951 Presence of aortocoronary bypass graft: Secondary | ICD-10-CM

## 2019-12-10 NOTE — Progress Notes (Signed)
[address]       [name] [address]             [phone]     CARDIOTHORACIC SURGERY OFFICE NOTE  Primary Cardiologist is Norman Herrlich, MD PCP is Baldo Ash, FNP   HPI:  Patient is a 54 year old morbidly obese male who returns the office today for routine follow-up status post aortic valve replacement using a stented bovine pericardial tissue valve and coronary artery bypass grafting x2 on to August 24, 2019 for severe symptomatic aortic stenosis and multivessel coronary artery disease.  His early postoperative recovery was notable only for transient episode of postoperative atrial fibrillation which resolved with amiodarone therapy.  He was last seen here in our office on Sep 27, 2019 at which time he was recovering uneventfully.  Since then he has been followed carefully by Dr. Dulce Sellar who has been adjusting his medications.  Routine follow-up echocardiogram performed October 25, 2019 revealed normal left ventricular systolic function with ejection fraction estimated 60 to 65%.  There was normal functioning bioprosthetic tissue valve in the aortic position with no aortic insufficiency and mean transvalvular gradient across the aortic valve estimated 15 mmHg.  Patient returns her office today and reports that he is doing very well.  He is back to normal physical activity without limitations.  He no longer has any pain at all in his chest.  He states that his breathing is much better than it was prior to surgery.  Overall he is quite pleased with his progress.   Current Outpatient Medications  Medication Sig Dispense Refill  . amoxicillin (AMOXIL) 500 MG capsule Take 4 capsules (2,000 mg) 45 minutes before dental procedures. 20 capsule 0  . aspirin EC 325 MG EC tablet Take 1 tablet (325 mg total) by mouth daily.    . [place]-Glucosamine-Vit D (OSTEO BI-FLEX ONE PER DAY PO) Take 1 tablet by mouth daily.     . Calcium Carbonate-Vitamin D (CALCIUM 600/VITAMIN D PO)  Take 1 tablet by mouth daily.    . furosemide (LASIX) 20 MG tablet Take 1 tablet (20 mg total) by mouth 3 (three) times a week. 41 tablet 3  . losartan (COZAAR) 50 MG tablet Take 1 tablet (50 mg total) by mouth daily. 90 tablet 3  . omeprazole (PRILOSEC OTC) 20 MG tablet Take 20 mg by mouth daily.    Marland Kitchen OVER THE COUNTER MEDICATION Take 1 tablet by mouth daily. Allergy Medicine    . rosuvastatin (CRESTOR) 20 MG tablet Take 1 tablet (20 mg total) by mouth daily. 90 tablet 3  . carvedilol (COREG) 12.5 MG tablet Take 1 tablet (12.5 mg total) by mouth 2 (two) times daily. 180 tablet 3  . ferrous fumarate-b12-vitamic C-folic acid (TRINSICON / FOLTRIN) capsule Take 1 capsule by mouth daily with breakfast. (Patient not taking: Reported on 12/10/2019) 30 capsule 2   No current facility-administered medications for this visit.      Physical Exam:   BP (!) 141/94 (BP Location: Right Arm, Cuff Size: Normal)   Pulse 64   Temp 97.7 F (36.5 C) (Temporal)   Resp 20   Ht 6\' 2"  (1.88 m)   Wt (!) 336 lb (152.4 kg)   SpO2 97% Comment: RA  BMI 43.14 kg/m   General:  Well-appearing  Chest:   Clear to auscultation  CV:   Regular rate and rhythm with grade 2/6 systolic murmur  Incisions:  Completely healed, sternum is stable  Abdomen:  Soft nontender  Extremities:  Warm and well-perfused  Diagnostic Tests:   ECHOCARDIOGRAM REPORT       Patient Name:  Antonio Riley Thane Date of Exam: 10/25/2019  Medical Rec #: [id number]      Height:    73.0 in  Accession #:  [id number]     Weight:    335.2 lb  Date of Birth: 1965-08-20      BSA:     2.680 m  Patient Age:  53 years      BP:      174/102 mmHg  Patient Gender: M          HR:      70 bpm.  Exam Location: [place]   Procedure: 2D Echo   Indications:  S/P aortic valve replacement with bioprosthetic valve +  CABG x2         [Z95.3 (ICD-10-CM)    History:    Patient has  prior history of Echocardiogram examinations,  most         recent 06/26/2019. CAD, Prior Cardiac Surgery, Aortic Valve         Disease, Signs/Symptoms:Morbid obesity; Risk         Factors:Hypertension.    Sonographer:  Louie Boston  Referring Phys: [phone] BRIAN J MUNLEY   IMPRESSIONS    1. Left ventricular ejection fraction, by estimation, is 60 to 65%. The  left ventricle has normal function. The left ventricle has no regional  wall motion abnormalities. There is severe concentric left ventricular  hypertrophy. Left ventricular diastolic  parameters are consistent with Grade II diastolic dysfunction  (pseudonormalization).  2. Right ventricular systolic function is normal. The right ventricular  size is normal. There is normal pulmonary artery systolic pressure.  3. Left atrial size was moderately dilated.  4. The mitral valve is normal in structure. Trivial mitral valve  regurgitation. No evidence of mitral stenosis.  5. Well seated Inspiris 25 mm bioprosthetic valve with no evidence of  stenosis. Aortic valve regurgitation is not visualized.  6. Aneurysm of the ascending aorta, measuring 41 mm.  7. The inferior vena cava is normal in size with greater than 50%  respiratory variability, suggesting right atrial pressure of 3 mmHg.   FINDINGS  Left Ventricle: Left ventricular ejection fraction, by estimation, is 60  to 65%. The left ventricle has normal function. The left ventricle has no  regional wall motion abnormalities. The left ventricular internal cavity  size was normal in size. There is  severe concentric left ventricular hypertrophy. Abnormal (paradoxical)  septal motion consistent with post-operative status. Left ventricular  diastolic parameters are consistent with Grade II diastolic dysfunction  (pseudonormalization).   Right Ventricle: The right ventricular size is normal. No increase in  right ventricular wall thickness.  Right ventricular systolic function is  normal. There is normal pulmonary artery systolic pressure. The tricuspid  regurgitant velocity is 2.01 m/s, and  with an assumed right atrial pressure of 3 mmHg, the estimated right  ventricular systolic pressure is 19.2 mmHg.   Left Atrium: Left atrial size was moderately dilated.   Right Atrium: Right atrial size was normal in size.   Pericardium: There is no evidence of pericardial effusion.   Mitral Valve: The mitral valve is normal in structure. Normal mobility of  the mitral valve leaflets. Trivial mitral valve regurgitation. No evidence  of mitral valve stenosis.   Tricuspid Valve: The tricuspid valve is normal in structure. Tricuspid  valve regurgitation is not demonstrated. No evidence of  tricuspid  stenosis.   Aortic Valve: The aortic valve has been repaired/replaced. Aortic valve  regurgitation is not visualized. No aortic stenosis is present. Aortic  valve mean gradient measures 15.0 mmHg. Aortic valve peak gradient  measures 25.4 mmHg. There is a 25 mm valve  present in the aortic position.   Pulmonic Valve: The pulmonic valve was normal in structure. Pulmonic valve  regurgitation is not visualized. No evidence of pulmonic stenosis.   Aorta: The aortic root is normal in size and structure. There is an  aneurysm involving the ascending aorta. The aneurysm measures 41 mm.   Venous: The inferior vena cava is normal in size with greater than 50%  respiratory variability, suggesting right atrial pressure of 3 mmHg.   IAS/Shunts: No atrial level shunt detected by color flow Doppler.     LEFT VENTRICLE  PLAX 2D  LVIDd:     5.10 cm Diastology  LVIDs:     3.10 cm LV e' lateral:  7.18 cm/s  LV PW:     1.70 cm LV E/e' lateral: 14.9  LV IVS:    1.70 cm LV e' medial:  5.55 cm/s             LV E/e' medial: 19.3     RIGHT VENTRICLE     IVC  TAPSE (M-mode): 2.5 cm IVC diam: 1.60 cm   LEFT  ATRIUM       Index    RIGHT ATRIUM      Index  LA diam:    4.80 cm 1.79 cm/m RA Area:   20.00 cm  LA Vol (A2C):  123.0 ml 45.90 ml/m RA Volume:  60.30 ml 22.50 ml/m  LA Vol (A4C):  115.0 ml 42.92 ml/m  LA Biplane Vol: 125.0 ml 46.65 ml/m  AORTIC VALVE  AV Vmax:      252.00 cm/s  AV Vmean:     183.000 cm/s  AV VTI:      0.565 m  AV Peak Grad:   25.4 mmHg  AV Mean Grad:   15.0 mmHg  LVOT Vmax:     99.00 cm/s  LVOT Vmean:    68.300 cm/s  LVOT VTI:     0.247 m  LVOT/AV VTI ratio: 0.44    AORTA  Ao Root diam: 3.50 cm  Ao Asc diam: 4.05 cm   MITRAL VALVE        TRICUSPID VALVE  MV Area (PHT): 2.95 cm   TR Peak grad:  16.2 mmHg  MV Decel Time: 257 msec   TR Vmax:    201.00 cm/s  MV E velocity: 107.00 cm/s  MV A velocity: 99.00 cm/s  SHUNTS  MV E/A ratio: 1.08     Systemic VTI: 0.25 m   Lavona Mound Tobb DO  Electronically signed by Thomasene Ripple DO  Signature Date/Time: 10/25/2019/1:23:06 PM      Impression:  Patient is doing well approximately 3 months status post aortic valve replacement using a bioprosthetic tissue valve and coronary artery bypass grafting  Plan:  We have not recommended any change the patient's current medications.  Patient will continue to follow-up regularly with Dr. Dulce Sellar and return to our office for routine follow-up next spring, approximately 1 year following his surgery.  He will call and return sooner should specific problems or questions arise.  The patient has been reminded regarding the importance of dental hygiene and the lifelong need for antibiotic prophylaxis for all dental cleanings and other related invasive procedures.    Marilu Favre  Zadie CleverlyH. Chyane Greer, MD 12/10/2019 12:14 PM

## 2019-12-10 NOTE — Patient Instructions (Signed)

## 2019-12-14 ENCOUNTER — Other Ambulatory Visit: Payer: Self-pay

## 2019-12-14 ENCOUNTER — Ambulatory Visit (INDEPENDENT_AMBULATORY_CARE_PROVIDER_SITE_OTHER): Payer: 59 | Admitting: Cardiology

## 2019-12-14 ENCOUNTER — Encounter: Payer: Self-pay | Admitting: Cardiology

## 2019-12-14 VITALS — BP 148/90 | HR 64 | Ht 74.0 in | Wt 337.4 lb

## 2019-12-14 DIAGNOSIS — Z953 Presence of xenogenic heart valve: Secondary | ICD-10-CM | POA: Diagnosis not present

## 2019-12-14 DIAGNOSIS — I2510 Atherosclerotic heart disease of native coronary artery without angina pectoris: Secondary | ICD-10-CM

## 2019-12-14 DIAGNOSIS — E7800 Pure hypercholesterolemia, unspecified: Secondary | ICD-10-CM

## 2019-12-14 DIAGNOSIS — I10 Essential (primary) hypertension: Secondary | ICD-10-CM

## 2019-12-14 DIAGNOSIS — E7841 Elevated Lipoprotein(a): Secondary | ICD-10-CM

## 2019-12-14 MED ORDER — ASPIRIN EC 81 MG PO TBEC
81.0000 mg | DELAYED_RELEASE_TABLET | Freq: Every day | ORAL | 3 refills | Status: DC
Start: 2019-12-14 — End: 2020-12-04

## 2019-12-14 NOTE — Patient Instructions (Signed)
Medication Instructions:  Your physician has recommended you make the following change in your medication:  STOP: Aspirin 325 mg  START: Aspirin 81 mg take one tablet by mouth daily.   Please get and take an over the counter magnesium *If you need a refill on your cardiac medications before your next appointment, please call your pharmacy*   Lab Work: Your physician recommends that you return for lab work in: TODAY BMP If you have labs (blood work) drawn today and your tests are completely normal, you will receive your results only by: Marland Kitchen MyChart Message (if you have MyChart) OR . A paper copy in the mail If you have any lab test that is abnormal or we need to change your treatment, we will call you to review the results.   Testing/Procedures: None   Follow-Up: At [facility], you and your health needs are our priority.  As part of our continuing mission to provide you with exceptional heart care, we have created designated Provider Care Teams.  These Care Teams include your primary Cardiologist (physician) and Advanced Practice Providers (APPs -  Physician Assistants and Nurse Practitioners) who all work together to provide you with the care you need, when you need it.  We recommend signing up for the patient portal called "MyChart".  Sign up information is provided on this After Visit Summary.  MyChart is used to connect with patients for Virtual Visits (Telemedicine).  Patients are able to view lab/test results, encounter notes, upcoming appointments, etc.  Non-urgent messages can be sent to your provider as well.   To learn more about what you can do with MyChart, go to [email or web address].    Your next appointment:   6 month(s)  The format for your next appointment:   In Person  Provider:   Norman Herrlich, MD   Other Instructions

## 2019-12-14 NOTE — Progress Notes (Signed)
Cardiology Office Note:    Date:  12/14/2019   ID:  Antonio Riley, DOB 1965-07-09, MRN [id number]  PCP:  Baldo Ash, FNP  Cardiologist:  Norman Herrlich, MD    Referring MD: Baldo Ash, FNP    ASSESSMENT:    1. S/P aortic valve replacement with bioprosthetic valve + CABG x2   2. Coronary artery disease involving native coronary artery of native heart without angina pectoris   3. Resistant hypertension   4. Pure hypercholesterolemia   5. Elevated lipoprotein A level    PLAN:    In order of problems listed above:  Stable he is recovered from his open heart surgery reduce aspirin 81 mg daily continue treatment including his high intensity statin and his lipids are ideal 10/25/2019  BP at target continue current treatment MRA small dose of loop diuretic ARB beta-blocker Continue statin Next appointment: 6 months   Medication Adjustments/Labs and Tests Ordered: Current medicines are reviewed at length with the patient today.  Concerns regarding medicines are outlined above.  No orders of the defined types were placed in this encounter.  No orders of the defined types were placed in this encounter.   Chief complaint follow-up after aortic valve replacement and bypass  History of Present Illness:    Antonio Riley is a 54 y.o. male with a hx of aortic valve replacement using a stented bovine pericardial tissue valve and coronary artery bypass grafting x2 on to August 24, 2019 for severe symptomatic aortic stenosis and multivessel coronary artery disease. His early postoperative recovery was notable only for transient episode of postoperative atrial fibrillation which resolved with amiodarone therapy.  He was last seen 10/05/2019.  His baseline echocardiogram shows normal bioprosthetic valve function normal left ventricular ejection fraction 60 to 65% with severe left ventricular hypertrophy.  Hyperlipidemia hyper lipidemia Compliance with diet, lifestyle and  medications: Yes  He is finally recovered from open heart surgery back to work feeling well except he gets frequent leg cramps.  He reduce his aspirin to 81 mg daily continue his current antihypertensives and with his labile renal function potassium recheck labs today.  No chest pain shortness of breath palpitation Past Medical History:  Diagnosis Date  . Anginal pain (HCC)   . Aortic stenosis   . BMI 40.0-44.9, adult (HCC)   . Coronary artery disease   . Heart murmur   . Hypertension   . Knee pain 03/15/2018  . S/P aortic valve replacement with bioprosthetic valve 08/24/2019   25 mm Edwards Inspiris Resilia stented bovine pericardial tissue valve  . 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 Swelling of lower leg     Past Surgical History:  Procedure Laterality Date  . AORTIC VALVE REPLACEMENT N/A 08/24/2019   Procedure: AORTIC VALVE REPLACEMENT (AVR) USING INSPIRIS VALVE SIZE ;  Surgeon: Purcell Nails, MD;  Location: [facility] OR;  Service: Open Heart Surgery;  Laterality: N/A;  . CORONARY ARTERY BYPASS GRAFT N/A 08/24/2019   Procedure: CORONARY ARTERY BYPASS GRAFTING (CABG) x two, using bilateral leg greater saphenous veins harvested endoscopically;  Surgeon: Purcell Nails, MD;  Location: [facility] OR;  Service: Open Heart Surgery;  Laterality: N/A;  . FEMUR SURGERY    . RIGHT/LEFT HEART CATH AND CORONARY ANGIOGRAPHY N/A 07/31/2019   Procedure: RIGHT/LEFT HEART CATH AND CORONARY ANGIOGRAPHY;  Surgeon: Tonny Bollman, MD;  Location: [facility] INVASIVE CV LAB;  Service: Cardiovascular;  Laterality: N/A;  .  TEE WITHOUT CARDIOVERSION N/A 08/24/2019   Procedure: TRANSESOPHAGEAL ECHOCARDIOGRAM (TEE);  Surgeon: Purcell Nails, MD;  Location: [facility] OR;  Service: Open Heart Surgery;  Laterality: N/A;  . WRIST SURGERY      Current Medications: No outpatient medications have been marked as taking for the 12/14/19 encounter (Appointment) with Baldo Daub, MD.      Allergies:   Coricidin hbp cough-cold [chlorpheniramine-dm]   Social History   Socioeconomic History  . Marital status: Married    Spouse name: Not on file  . Number of children: Not on file  . Years of education: Not on file  . Highest education level: Not on file  Occupational History  . Not on file  Tobacco Use  . Smoking status: Former Smoker    Packs/day: 0.25    Years: 28.00    Pack years: 7.00    Types: Cigarettes    Quit date: 08/21/2019    Years since quitting: 0.3  . Smokeless tobacco: Never Used  . Tobacco comment: trying to quit  Vaping Use  . Vaping Use: Never used  Substance and Sexual Activity  . Alcohol use: Yes    Comment: occ beer   . Drug use: Not Currently  . Sexual activity: Not on file  Other Topics Concern  . Not on file  Social History Narrative  . Not on file   Social Determinants of Health   Financial Resource Strain:   . Difficulty of Paying Living Expenses:   Food Insecurity:   . Worried About Programme researcher, broadcasting/film/video in the Last Year:   . Barista in the Last Year:   Transportation Needs:   . Freight forwarder (Medical):   Marland Kitchen Lack of Transportation (Non-Medical):   Physical Activity:   . Days of Exercise per Week:   . Minutes of Exercise per Session:   Stress:   . Feeling of Stress :   Social Connections:   . Frequency of Communication with Friends and Family:   . Frequency of Social Gatherings with Friends and Family:   . Attends Religious Services:   . Active Member of Clubs or Organizations:   . Attends Banker Meetings:   Marland Kitchen Marital Status:      Family History: The patient's family history includes CAD in his mother; Deep vein thrombosis in his mother; Diabetes in his cousin and mother; Heart murmur in his mother. ROS:   Please see the history of present illness.    All other systems reviewed and are negative.  EKGs/Labs/Other Studies Reviewed:    The following studies were reviewed  today:   Recent Labs: 04/30/2019: NT-Pro BNP 86 08/25/2019: Magnesium 1.9 09/13/2019: ALT 27 09/17/2019: Hemoglobin 11.0; Platelets 438 10/25/2019: BUN 15; Creatinine, Ser 0.98; Potassium 4.8; Sodium 139  Recent Lipid Panel    Component Value Date/Time   CHOL 118 10/25/2019 0947   TRIG 85 10/25/2019 0947   HDL 42 10/25/2019 0947   CHOLHDL 2.8 10/25/2019 0947   LDLCALC 59 10/25/2019 0947    Physical Exam:    VS:  There were no vitals taken for this visit.    Wt Readings from Last 3 Encounters:  12/10/19 (!) 336 lb (152.4 kg)  10/05/19 (!) 335 lb 3.2 oz (152 kg)  09/27/19 (!) 331 lb (150.1 kg)    Repeat blood pressure by me sitting resting 1 3686 GEN:  Well nourished, well developed in no acute distress HEENT: Normal NECK: No JVD; No carotid bruits  LYMPHATICS: No lymphadenopathy CARDIAC: RRR, no murmurs, rubs, gallops RESPIRATORY:  Clear to auscultation without rales, wheezing or rhonchi  ABDOMEN: Soft, non-tender, non-distended MUSCULOSKELETAL:  No edema; No deformity  SKIN: Warm and dry NEUROLOGIC:  Alert and oriented x 3 PSYCHIATRIC:  Normal affect    Signed, Norman Herrlich, MD  12/14/2019 12:33 PM    [facility]

## 2019-12-15 LAB — BASIC METABOLIC PANEL
BUN/Creatinine Ratio: 12 (ref 9–20)
BUN: 14 mg/dL (ref 6–24)
CO2: 25 mmol/L (ref 20–29)
Calcium: 9.7 mg/dL (ref 8.7–10.2)
Chloride: 94 mmol/L — ABNORMAL LOW (ref 96–106)
Creatinine, Ser: 1.21 mg/dL (ref 0.76–1.27)
GFR calc Af Amer: 79 mL/min/{1.73_m2} (ref >59)
GFR calc non Af Amer: 68 mL/min/{1.73_m2} (ref >59)
Glucose: 90 mg/dL (ref 65–99)
Potassium: 5.4 mmol/L — ABNORMAL HIGH (ref 3.5–5.2)
Sodium: 137 mmol/L (ref 134–144)

## 2019-12-17 ENCOUNTER — Telehealth: Payer: Self-pay

## 2019-12-17 NOTE — Telephone Encounter (Signed)
-----   Message from Baldo Daub, MD sent at 12/16/2019  2:24 PM EDT ----- Normal or stable result  No change in treatment

## 2019-12-17 NOTE — Telephone Encounter (Signed)
Left message on patients voicemail to please return our call.   

## 2019-12-17 NOTE — Telephone Encounter (Signed)
Follow up   Pt is returning call    

## 2020-01-25 ENCOUNTER — Other Ambulatory Visit: Payer: Self-pay | Admitting: Cardiology

## 2020-01-27 ENCOUNTER — Other Ambulatory Visit: Payer: Self-pay | Admitting: Cardiology

## 2020-05-12 ENCOUNTER — Other Ambulatory Visit: Payer: Self-pay | Admitting: Surgical

## 2020-05-13 ENCOUNTER — Other Ambulatory Visit: Payer: Self-pay | Admitting: Cardiology

## 2020-05-13 NOTE — Telephone Encounter (Signed)
Refill sent to pharmacy.   

## 2020-06-02 DIAGNOSIS — R011 Cardiac murmur, unspecified: Secondary | ICD-10-CM | POA: Insufficient documentation

## 2020-06-02 DIAGNOSIS — I10 Essential (primary) hypertension: Secondary | ICD-10-CM | POA: Insufficient documentation

## 2020-06-02 DIAGNOSIS — I209 Angina pectoris, unspecified: Secondary | ICD-10-CM | POA: Insufficient documentation

## 2020-06-06 ENCOUNTER — Ambulatory Visit: Payer: 59 | Admitting: Cardiology

## 2020-07-28 NOTE — Progress Notes (Signed)
Cardiology Office Note:    Date:  07/29/2020   ID:  Antonio Riley, DOB [birth year], MRN [id number]  PCP:  Patient, No Pcp Per  Cardiologist:  Norman Herrlich, MD    Referring MD: Baldo Ash, FNP    ASSESSMENT:    1. Coronary artery disease involving native coronary artery of native heart without angina pectoris   2. S/P aortic valve replacement with bioprosthetic valve   3. S/P CABG x 2   4. Resistant hypertension   5. Pure hypercholesterolemia   6. Elevated lipoprotein A level    PLAN:    In order of problems listed above:  1. Has done well from cardiac surgery New York Heart Association class I having no anginal discomfort he is compliant with treatment except statin intolerant we will continue low-dose aspirin switch to PCSK9 inhibitor for lipid-lowering with his elevated LP(a) continue his current antihypertensives trending home blood pressures.  6 weeks he will return will do labs including CMP lipids LP(a) and if necessary adjust blood pressure medications.   Next appointment: 6 months for lipid follow-up   Medication Adjustments/Labs and Tests Ordered: Current medicines are reviewed at length with the patient today.  Concerns regarding medicines are outlined above.  Orders Placed This Encounter  Procedures  . Comprehensive metabolic panel  . Lipid panel  . Lipoprotein A (LPA)   Meds ordered this encounter  Medications  . Evolocumab (REPATHA SURECLICK) 140 MG/ML SOAJ    Sig: Inject 140 mg into the skin every 14 (fourteen) days.    Dispense:  2 mL    Refill:  3    Chief Complaint  Patient presents with  . Follow-up    After AVR and bypass surgery 08/24/2019    History of Present Illness:    Antonio Riley is a 55 y.o. male with a hx of severe symptomatic aortic stenosis with aortic valve replacement day stented bovine pericardial tissue valve and coronary artery bypass surgery 08/24/2019, he had postoperative atrial fibrillation resistant  hypertension hyperlipidemia and elevated lipoprotein LPa last seen 12/14/2019. Compliance with diet, lifestyle and medications: Yes  Postoperative echocardiogram showed normal valve function when performed 10/25/2019 He has made a full recovery from surgery. Home blood pressure runs in the range of 130/80-85. He was intolerant of his statin with muscle pain cramps weakness stopped his around Christmas does not want to try another statin and with his elevated LP(a) I am going to transition to PCSK9 inhibitor. 6 weeks check labs including LP(a) lipids on treatment and CMP Past Medical History:  Diagnosis Date  . Anginal pain (HCC)   . Aortic stenosis   . BMI 40.0-44.9, adult (HCC)   . Coronary artery disease   . Heart murmur   . Hypertension   . Knee pain 03/15/2018  . S/P aortic valve replacement with bioprosthetic valve 08/24/2019   25 mm Edwards Inspiris Resilia stented bovine pericardial tissue valve  . 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 Swelling of lower leg     Past Surgical History:  Procedure Laterality Date  . AORTIC VALVE REPLACEMENT N/A 08/24/2019   Procedure: AORTIC VALVE REPLACEMENT (AVR) USING INSPIRIS VALVE SIZE ;  Surgeon: Purcell Nails, MD;  Location: [facility] OR;  Service: Open Heart Surgery;  Laterality: N/A;  . CORONARY ARTERY BYPASS GRAFT N/A 08/24/2019   Procedure: CORONARY ARTERY BYPASS GRAFTING (CABG) x two, using bilateral leg greater saphenous veins harvested endoscopically;  Surgeon: Purcell Nails, MD;  Location: [facility] OR;  Service: Open Heart Surgery;  Laterality: N/A;  . FEMUR SURGERY    . RIGHT/LEFT HEART CATH AND CORONARY ANGIOGRAPHY N/A 07/31/2019   Procedure: RIGHT/LEFT HEART CATH AND CORONARY ANGIOGRAPHY;  Surgeon: Tonny Bollman, MD;  Location: [facility] INVASIVE CV LAB;  Service: Cardiovascular;  Laterality: N/A;  . TEE WITHOUT CARDIOVERSION N/A 08/24/2019   Procedure: TRANSESOPHAGEAL ECHOCARDIOGRAM (TEE);  Surgeon: Purcell Nails, MD;  Location: [facility] OR;  Service: Open Heart Surgery;  Laterality: N/A;  . WRIST SURGERY      Current Medications: Current Meds  Medication Sig  . aspirin EC 81 MG tablet Take 1 tablet (81 mg total) by mouth daily. Swallow whole.  . Calcium Carbonate-Vitamin D (CALCIUM 600/VITAMIN D PO) Take 1 tablet by mouth daily.  . carvedilol (COREG) 12.5 MG tablet Take 1 tablet (12.5 mg total) by mouth 2 (two) times daily.  . Evolocumab (REPATHA SURECLICK) 140 MG/ML SOAJ Inject 140 mg into the skin every 14 (fourteen) days.  . furosemide (LASIX) 20 MG tablet Take 1 tablet (20 mg total) by mouth 3 (three) times a week.  . losartan (COZAAR) 50 MG tablet Take 1 tablet (50 mg total) by mouth daily.  Marland Kitchen omeprazole (PRILOSEC OTC) 20 MG tablet Take 20 mg by mouth daily.  Marland Kitchen OVER THE COUNTER MEDICATION Take 1 tablet by mouth daily. Allergy Medicine  . spironolactone (ALDACTONE) 25 MG tablet TAKE 1 TABLET BY MOUTH EVERY DAY     Allergies:   Coricidin hbp cough-cold [chlorpheniramine-dm]   Social History   Socioeconomic History  . Marital status: Married    Spouse name: Not on file  . Number of children: Not on file  . Years of education: Not on file  . Highest education level: Not on file  Occupational History  . Not on file  Tobacco Use  . Smoking status: Former Smoker    Packs/day: 0.25    Years: 28.00    Pack years: 7.00    Types: Cigarettes    Quit date: 08/21/2019    Years since quitting: 0.9  . Smokeless tobacco: Never Used  . Tobacco comment: trying to quit  Vaping Use  . Vaping Use: Never used  Substance and Sexual Activity  . Alcohol use: Yes    Comment: occ beer   . Drug use: Not Currently  . Sexual activity: Not on file  Other Topics Concern  . Not on file  Social History Narrative  . Not on file   Social Determinants of Health   Financial Resource Strain: Not on file  Food Insecurity: Not on file  Transportation Needs: Not on file  Physical Activity: Not on file   Stress: Not on file  Social Connections: Not on file     Family History: The patient's family history includes CAD in his mother; Deep vein thrombosis in his mother; Diabetes in his cousin and mother; Heart murmur in his mother. ROS:   Please see the history of present illness.    All other systems reviewed and are negative.  EKGs/Labs/Other Studies Reviewed:    The following studies were reviewed today:    Recent Labs: 08/25/2019: Magnesium 1.9 09/13/2019: ALT 27 09/17/2019: Hemoglobin 11.0; Platelets 438 12/14/2019: BUN 14; Creatinine, Ser 1.21; Potassium 5.4; Sodium 137  Recent Lipid Panel    Component Value Date/Time   CHOL 118 10/25/2019 0947   TRIG 85 10/25/2019 0947   HDL 42 10/25/2019 0947   CHOLHDL 2.8 10/25/2019  0947   LDLCALC 59 10/25/2019 0947    Physical Exam:    VS:  BP (!) 142/90   Pulse 68   Ht 6\' 2"  (1.88 m)   Wt (!) 341 lb 3.2 oz (154.8 kg)   SpO2 97%   BMI 43.81 kg/m     Wt Readings from Last 3 Encounters:  07/29/20 (!) 341 lb 3.2 oz (154.8 kg)  12/14/19 (!) 337 lb 6.4 oz (153 kg)  12/10/19 (!) 336 lb (152.4 kg)     GEN:  well nourished, well developed in no acute distress HEENT: Normal NECK: No JVD; No carotid bruits LYMPHATICS: No lymphadenopathy CARDIAC: RRR, no murmurs, rubs, gallops RESPIRATORY:  Clear to auscultation without rales, wheezing or rhonchi  ABDOMEN: Soft, non-tender, non-distended MUSCULOSKELETAL:  No edema; No deformity  SKIN: Warm and dry NEUROLOGIC:  Alert and oriented x 3 PSYCHIATRIC:  Normal affect    Signed, 12/12/19, MD  07/29/2020 4:19 PM    [facility]

## 2020-07-29 ENCOUNTER — Other Ambulatory Visit: Payer: Self-pay

## 2020-07-29 ENCOUNTER — Encounter: Payer: Self-pay | Admitting: Cardiology

## 2020-07-29 ENCOUNTER — Other Ambulatory Visit: Payer: Self-pay | Admitting: Cardiology

## 2020-07-29 ENCOUNTER — Ambulatory Visit: Payer: 59 | Admitting: Cardiology

## 2020-07-29 VITALS — BP 142/90 | HR 68 | Ht 74.0 in | Wt 341.2 lb

## 2020-07-29 DIAGNOSIS — I1A0 Resistant hypertension: Secondary | ICD-10-CM

## 2020-07-29 DIAGNOSIS — Z951 Presence of aortocoronary bypass graft: Secondary | ICD-10-CM

## 2020-07-29 DIAGNOSIS — I10 Essential (primary) hypertension: Secondary | ICD-10-CM | POA: Diagnosis not present

## 2020-07-29 DIAGNOSIS — Z953 Presence of xenogenic heart valve: Secondary | ICD-10-CM | POA: Diagnosis not present

## 2020-07-29 DIAGNOSIS — I2510 Atherosclerotic heart disease of native coronary artery without angina pectoris: Secondary | ICD-10-CM | POA: Diagnosis not present

## 2020-07-29 DIAGNOSIS — E7800 Pure hypercholesterolemia, unspecified: Secondary | ICD-10-CM

## 2020-07-29 DIAGNOSIS — E7841 Elevated Lipoprotein(a): Secondary | ICD-10-CM

## 2020-07-29 MED ORDER — REPATHA SURECLICK 140 MG/ML ~~LOC~~ SOAJ: 140.0000 mg | SUBCUTANEOUS | 3 refills | Status: DC

## 2020-07-29 NOTE — Patient Instructions (Signed)
Medication Instructions:  Your physician has recommended you make the following change in your medication:  START: Repatha 140 mg inject one pen into the skin every 14 days.  *If you need a refill on your cardiac medications before your next appointment, please call your pharmacy*   Lab Work: Your physician recommends that you return for lab work in: 6 weeks CMP, Lpa, Lipids If you have labs (blood work) drawn today and your tests are completely normal, you will receive your results only by: Marland Kitchen MyChart Message (if you have MyChart) OR . A paper copy in the mail If you have any lab test that is abnormal or we need to change your treatment, we will call you to review the results.   Testing/Procedures: None   Follow-Up: At [facility], you and your health needs are our priority.  As part of our continuing mission to provide you with exceptional heart care, we have created designated Provider Care Teams.  These Care Teams include your primary Cardiologist (physician) and Advanced Practice Providers (APPs -  Physician Assistants and Nurse Practitioners) who all work together to provide you with the care you need, when you need it.  We recommend signing up for the patient portal called "MyChart".  Sign up information is provided on this After Visit Summary.  MyChart is used to connect with patients for Virtual Visits (Telemedicine).  Patients are able to view lab/test results, encounter notes, upcoming appointments, etc.  Non-urgent messages can be sent to your provider as well.   To learn more about what you can do with MyChart, go to [email or web address].    Your next appointment:   6 month(s)  The format for your next appointment:   In Person  Provider:   Norman Herrlich, MD   Other Instructions

## 2020-07-30 ENCOUNTER — Other Ambulatory Visit: Payer: Self-pay | Admitting: Cardiology

## 2020-07-30 ENCOUNTER — Telehealth: Payer: Self-pay

## 2020-07-30 DIAGNOSIS — Z951 Presence of aortocoronary bypass graft: Secondary | ICD-10-CM

## 2020-07-30 DIAGNOSIS — I2510 Atherosclerotic heart disease of native coronary artery without angina pectoris: Secondary | ICD-10-CM

## 2020-07-30 DIAGNOSIS — Z789 Other specified health status: Secondary | ICD-10-CM

## 2020-07-30 NOTE — Telephone Encounter (Signed)
PA started on CMM for Repatha. Key [id number]

## 2020-07-31 IMAGING — CT CT ANGIO CHEST
2 of 7 series · 16 of 36 positions shown · IV contrast (APPLIED)
Comparison: None.

CLINICAL DATA: Severe aortic stenosis.  TAVR evaluation.

EXAM:
CT ANGIOGRAPHY CHEST, ABDOMEN AND PELVIS
TECHNIQUE: Multidetector CT imaging through the chest, abdomen and pelvis was
performed using the standard protocol during bolus administration of
intravenous contrast. Multiplanar reconstructed images and MIPs were
obtained and reviewed to evaluate the vascular anatomy.
CONTRAST:  100mL OMNIPAQUE IOHEXOL 350 MG/ML SOLN

[Series 1: ax thins · axial · 0.88mm/px · z∈[+820,+1432]mm · 15 of 690 slices shown]
[im 39/690  lung]
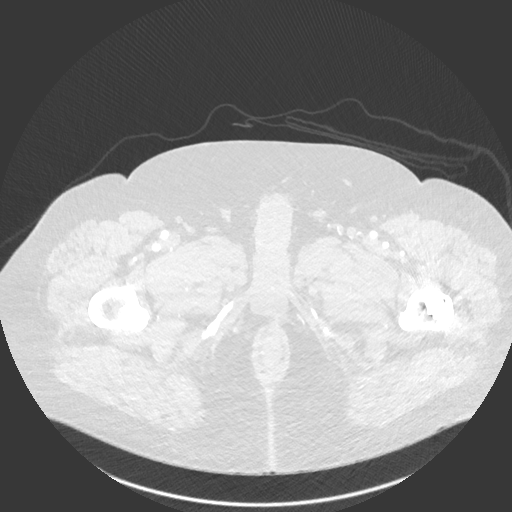
[im 77/690  mediastinal]
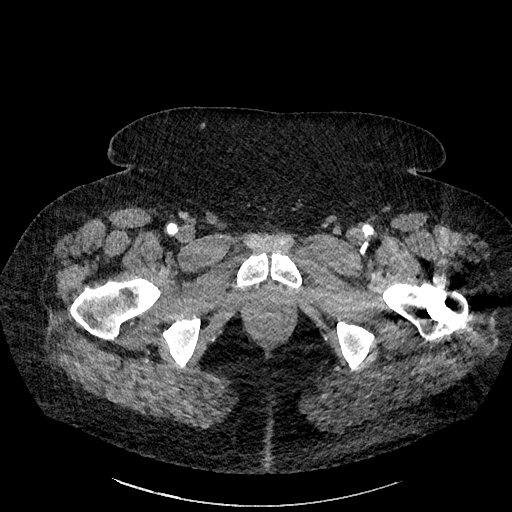
[im 115/690  lung]
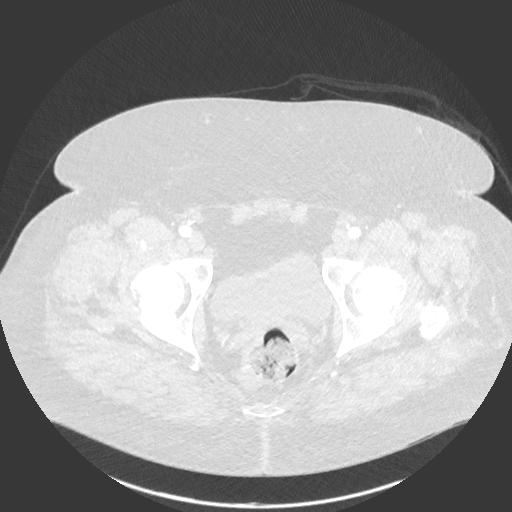
[im 154/690  mediastinal]
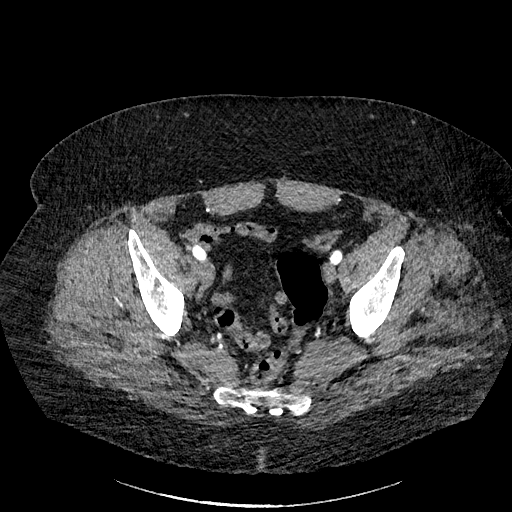
[im 230/690  lung]
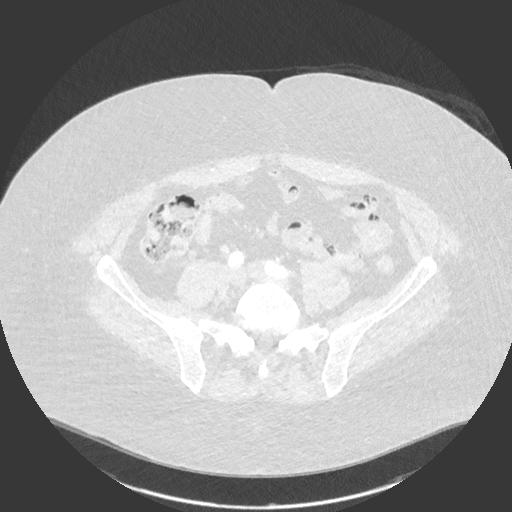
[im 268/690  mediastinal]
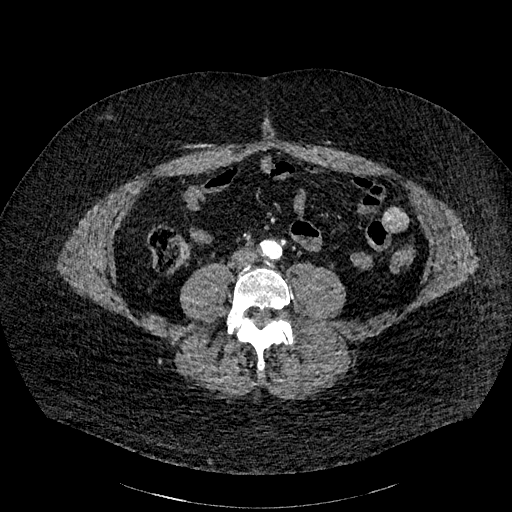
[im 307/690  lung]
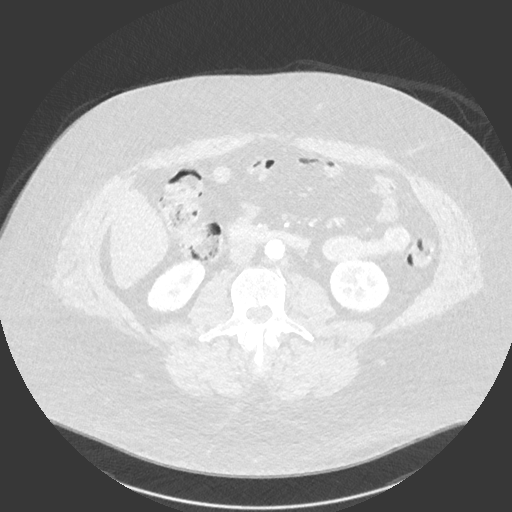
[im 345/690  mediastinal]
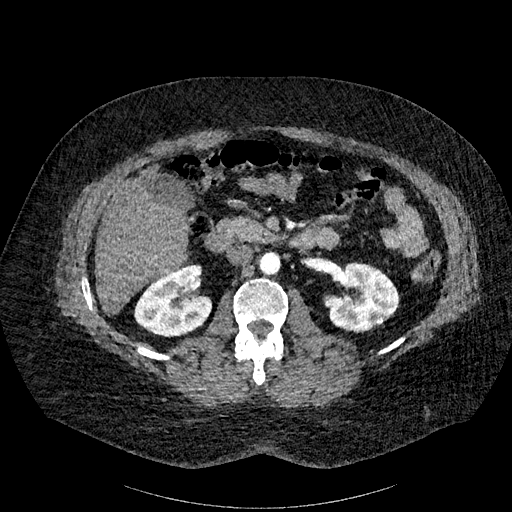
[im 383/690  lung]
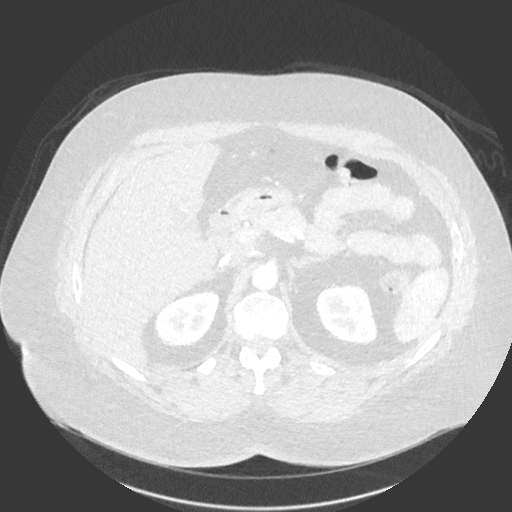
[im 422/690  mediastinal]
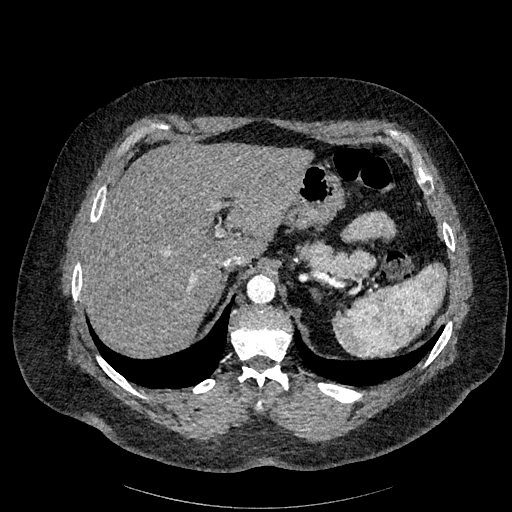
[im 460/690  lung]
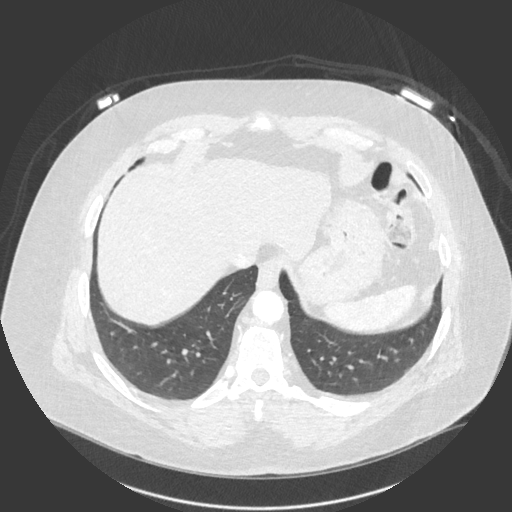
[im 536/690  mediastinal]
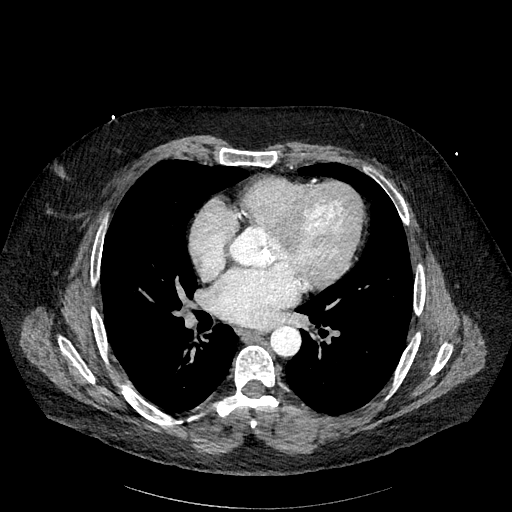
[im 575/690  lung]
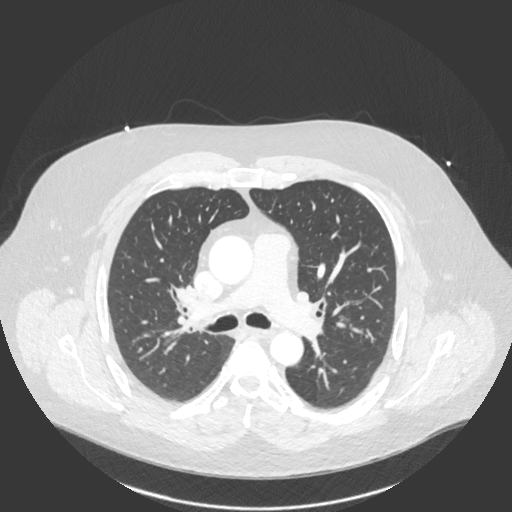
[im 613/690  mediastinal]
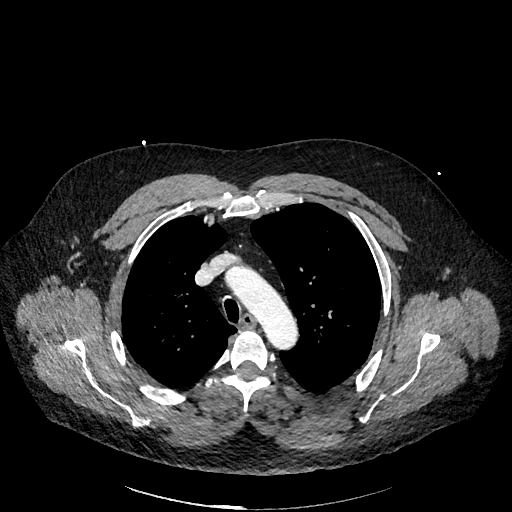
[im 651/690  lung]
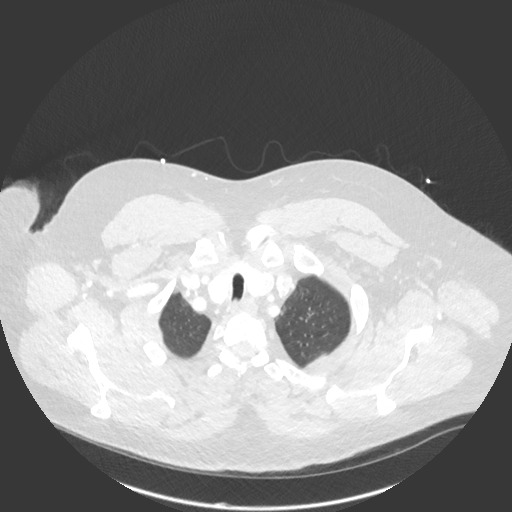

[Series 6: cor · coronal · 0.97mm/px · 1 of 180 slices shown]
[im 90/180  mediastinal]
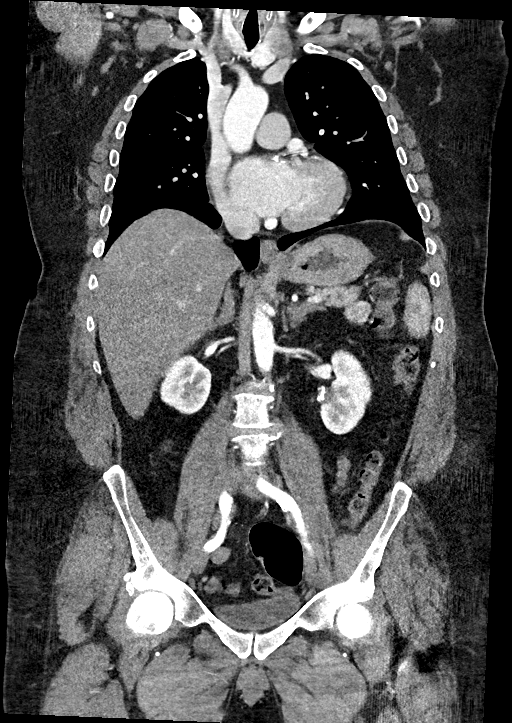

[16 of 36 positions shown; findings below may reference images not displayed]

FINDINGS: CTA CHEST FINDINGS

Cardiovascular: Top-normal heart size. Diffuse thickening and coarse
calcification of the aortic valve. No significant pericardial
effusion/thickening. Left anterior descending and left circumflex
coronary atherosclerosis. Atherosclerotic nonaneurysmal thoracic
aorta. Top-normal caliber main pulmonary artery (3.1 cm diameter).
No central pulmonary emboli.

Mediastinum/Nodes: Mild multinodular goiter with dominant partially
calcified 2.7 cm right thyroid nodule. Unremarkable esophagus. No
pathologically enlarged axillary, mediastinal or hilar lymph nodes.

Lungs/Pleura: No pneumothorax. No pleural effusion. No acute
consolidative airspace disease, lung masses or significant pulmonary
nodules.

Musculoskeletal: No aggressive appearing focal osseous lesions.
Moderate thoracic spondylosis.

CTA ABDOMEN AND PELVIS FINDINGS

Hepatobiliary: Normal liver size. Subcapsular hypervascular 1.1 cm
left liver dome focus (series 1/image 221). No additional liver
lesions. Normal gallbladder with no radiopaque cholelithiasis. No
biliary ductal dilatation.

Pancreas: Normal, with no mass or duct dilation. There is a 2.6 cm
splenule along the posterior pancreatic tail with heterogeneous
arterial phase enhancement equivalent to the spleen.

Spleen: Normal size. No mass.

Adrenals/Urinary Tract: Normal right adrenal. Left adrenal 1.5 cm
nodule with density 50 HU. Indeterminate partially calcified
hypodense 1.7 cm renal cortical lesion in the medial lower left
kidney (series 12/image 371). Similar indeterminate partially
calcified hypodense 1.2 cm renal cortical lesion in posterior
interpolar left kidney (series 1/image 340). Subcentimeter hypodense
renal cortical lesions scattered in the left kidney are too small to
characterize and require no follow-up. Normal bladder.

Stomach/Bowel: Normal non-distended stomach. Normal caliber small
bowel with no small bowel wall thickening. Normal appendix. Mild
left colonic diverticulosis, no large bowel wall thickening or
significant pericolonic fat stranding.

Vascular/Lymphatic: Atherosclerotic nonaneurysmal abdominal aorta.
Patent renal and splenic veins. No pathologically enlarged lymph
nodes in the abdomen or pelvis.

Reproductive: Normal size prostate.

Other: No pneumoperitoneum, ascites or focal fluid collection.
Superficial cystic 1.9 cm lesion in the right back compatible with
sebaceous cyst (series 1/image 410).

Musculoskeletal: No aggressive appearing focal osseous lesions.
Fixation hardware partially visualized in the proximal left femur.
Marked lumbar spondylosis.

VASCULAR MEASUREMENTS PERTINENT TO TAVR:

AORTA:

Minimal Aortic Niameter-G5.5 x 14.9 mm

Severity of Aortic Calcification-mild

RIGHT PELVIS:

Right Common Iliac Artery -

Minimal 7iameter-8.W x 8.6 mm

Tortuosity-mild

Calcification-moderate

Right External Iliac Artery -

Minimal Diameter-N.M x 8.9 mm

Tortuosity-mild

Calcification-mild

Right Common Femoral Artery -

Minimal Giameter-N.V x 6.9 mm

Tortuosity-mild

Calcification-mild

LEFT PELVIS:

Left Common Iliac Artery -

Minimal Tiameter-N.U x 8.9 mm

Tortuosity-moderate

Calcification-mild

Left External Iliac Artery -

Minimal Hiameter-5.D x 8.2 mm

Tortuosity-mild

Calcification-none

Left Common Femoral Artery -

Minimal Liameter-Z.9 x 7.6 mm

Tortuosity-mild

Calcification-mild to moderate

Review of the MIP images confirms the above findings.
IMPRESSION: 1. Vascular findings and measurements pertinent to potential TAVR
procedure, as detailed.
2. Diffuse thickening and coarse calcification of the aortic valve,
compatible with the reported history of severe aortic stenosis.
3. Two vessel coronary atherosclerosis.
4. Mild multinodular goiter with dominant 2.7 cm partially calcified
right thyroid nodule. Recommend thyroid US (ref: [HOSPITAL].
[DATE]): 143-50).
5. Hypervascular 1.1 cm left liver dome focus. Indeterminate left
adrenal nodule. Indeterminate calcified small left renal cortical
lesions. Each of these findings can be further evaluated with MRI
abdomen without and with IV contrast.
6.  Aortic Atherosclerosis ([id number]).

## 2020-08-03 ENCOUNTER — Other Ambulatory Visit: Payer: Self-pay

## 2020-08-04 MED ORDER — AMOXICILLIN 500 MG PO CAPS: ORAL_CAPSULE | ORAL | 0 refills | Status: DC

## 2020-08-04 NOTE — Telephone Encounter (Signed)
Left message on patients voicemail to please return our call.   

## 2020-08-04 NOTE — Telephone Encounter (Signed)
PA denied on CMM. See below. This request was denied because you did not meet the following clinical requirements: Based on the information provided, you do not meet the established medication-specific criteria or guidelines for Repatha Sureclick at this time. The request for coverage for Repatha Sure Inj 140mg /Ml, use as directed (2 per month), is denied. This decision is based on health plan criteria for Repatha Sureclick. This medicine is covered only if: All of the following: (1) One of the following: (A) You are currently on Praluent (as evidenced by paid claims in the past 120 days) and converting to Repatha, all of the following: (I) You continue to receive statin at maximally tolerated dose (unless you have an inability to take statins). (II) You are continuing a low-fat diet and exercise regimen. (III) Documentation of positive clinical response to proprotein convertase subtilisin/kexin type 9 therapy from pre-treatment baseline. (B) One of the following: (a) One of the following low-density lipoprotein-C values while on maximally tolerated lipid lowering therapy for a minimum of at least 12 weeks within the last 120 days or 120 days prior to starting proprotein convertase subtilisin/kexin type 9 inhibitor therapy: (i) Low-density lipoprotein cholesterol greater than or equal to 100mg /dL with atherosclerotic cardiovascular disease. (ii) Low-density lipoprotein cholesterol greater than or equal to 130mg /dL without atherosclerotic cardiovascular disease. (b) Both of the following: (i) One of the following low-density lipoprotein cholesterol values while on maximally tolerated lipid lowering therapy for a minimum of at least 12 weeks within the last 120 days or 120 days prior to starting proprotein convertase subtilisin/kexin type 9 inhibitor therapy: (AA) Low-density lipoprotein cholesterol between 70mg /dL and 99mg /dL with atherosclerotic cardiovascular disease. (BB) Low-density  lipoprotein cholesterol between 100mg /dL and 129mg /dL without atherosclerotic cardiovascular disease. (ii) One of the following: (AA) You have been receiving at least 12 consecutive weeks of ezetimibe (Zetia) therapy as adjunct to maximally tolerated statin therapy. (BB) You have a history of contraindication, or intolerance to ezetimibe. (2) Your prescriber attests to the following: the information provided is true and accurate to the best of their knowledge and they understand that [employer] may perform a routine audit and request the medical information necessary to verify the accuracy of the information provided. The information provided does not show that you meet the criteria listed above.

## 2020-08-04 NOTE — Telephone Encounter (Signed)
I would please refer to lipid clinic Pharm.D.

## 2020-08-05 NOTE — Telephone Encounter (Signed)
Spoke to the patient just now and let him know that I have placed the referral for him to speak to the lipid clinic since the PA that we have submitted was denied and Dr. Dulce Sellar would really like for him to be on this medication. He verbalizes understanding and thanks me for the call back.

## 2020-08-06 ENCOUNTER — Encounter: Payer: Self-pay | Admitting: Sports Medicine

## 2020-08-06 ENCOUNTER — Ambulatory Visit (INDEPENDENT_AMBULATORY_CARE_PROVIDER_SITE_OTHER): Payer: 59

## 2020-08-06 ENCOUNTER — Ambulatory Visit: Payer: 59 | Admitting: Sports Medicine

## 2020-08-06 ENCOUNTER — Other Ambulatory Visit: Payer: Self-pay | Admitting: Sports Medicine

## 2020-08-06 ENCOUNTER — Other Ambulatory Visit: Payer: Self-pay

## 2020-08-06 DIAGNOSIS — M2142 Flat foot [pes planus] (acquired), left foot: Secondary | ICD-10-CM

## 2020-08-06 DIAGNOSIS — M779 Enthesopathy, unspecified: Secondary | ICD-10-CM | POA: Diagnosis not present

## 2020-08-06 DIAGNOSIS — M76829 Posterior tibial tendinitis, unspecified leg: Secondary | ICD-10-CM

## 2020-08-06 DIAGNOSIS — M19079 Primary osteoarthritis, unspecified ankle and foot: Secondary | ICD-10-CM | POA: Diagnosis not present

## 2020-08-06 DIAGNOSIS — M79671 Pain in right foot: Secondary | ICD-10-CM

## 2020-08-06 DIAGNOSIS — M25571 Pain in right ankle and joints of right foot: Secondary | ICD-10-CM

## 2020-08-06 DIAGNOSIS — M2141 Flat foot [pes planus] (acquired), right foot: Secondary | ICD-10-CM | POA: Diagnosis not present

## 2020-08-06 MED ORDER — MELOXICAM 15 MG PO TABS: 15.0000 mg | ORAL_TABLET | Freq: Every day | ORAL | 0 refills | Status: DC

## 2020-08-06 MED ORDER — TRIAMCINOLONE ACETONIDE 10 MG/ML IJ SUSP: 10.0000 mg | Freq: Once | INTRAMUSCULAR | Status: DC

## 2020-08-06 MED ORDER — PREDNISONE 10 MG (21) PO TBPK: ORAL_TABLET | ORAL | 0 refills | Status: DC

## 2020-08-06 NOTE — Progress Notes (Signed)
Subjective: Antonio Riley is a 55 y.o. male patient who presents to office for evaluation of right foot pain. Patient complains of progressive pain especially over the last several weeks. Had a sprain in Sept where right foot rolled to the outside but pain went away and then came back in Jan and now over the last few weeks pain has gotten worsen with swelling at entire ankle with worse pain to the inside of the ankle and pain with 1st few steps with some burning/numbness to right 1st toe. Patient has tried Lasix for swelling and Ibuprofen with some relief but still has pain.  Review of Systems  All other systems reviewed and are negative.   Patient Active Problem List   Diagnosis Date Noted  . Hypertension   . Heart murmur   . Anginal pain (HCC)   . S/P aortic valve replacement with bioprosthetic valve 08/24/2019  . 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 Coronary artery disease involving native coronary artery of native heart without angina pectoris   . Aortic stenosis   . BMI 40.0-44.9, adult (HCC)   . Knee pain 03/15/2018  . Swelling of lower leg 03/15/2018  . Resistant hypertension 03/15/2018    Current Outpatient Medications on File Prior to Visit  Medication Sig Dispense Refill  . amoxicillin (AMOXIL) 500 MG capsule Take 4 capsules (2,000 mg) 45 minutes before dental procedures. 20 capsule 0  . aspirin EC 81 MG tablet Take 1 tablet (81 mg total) by mouth daily. Swallow whole. 90 tablet 3  . Calcium Carbonate-Vitamin D (CALCIUM 600/VITAMIN D PO) Take 1 tablet by mouth daily.    . carvedilol (COREG) 12.5 MG tablet Take 1 tablet (12.5 mg total) by mouth 2 (two) times daily. 180 tablet 3  . Evolocumab (REPATHA SURECLICK) 140 MG/ML SOAJ Inject 140 mg into the skin every 14 (fourteen) days. 2 mL 3  . furosemide (LASIX) 20 MG tablet Take 1 tablet (20 mg total) by mouth 3 (three) times a week. 41 tablet 3  . losartan (COZAAR) 50 MG tablet Take 1 tablet (50 mg total) by mouth daily. 90 tablet 3   . omeprazole (PRILOSEC OTC) 20 MG tablet Take 20 mg by mouth daily.    Marland Kitchen OVER THE COUNTER MEDICATION Take 1 tablet by mouth daily. Allergy Medicine    . rosuvastatin (CRESTOR) 20 MG tablet Take 1 tablet (20 mg total) by mouth daily. (Patient not taking: Reported on 07/29/2020) 90 tablet 3  . spironolactone (ALDACTONE) 25 MG tablet TAKE 1 TABLET BY MOUTH EVERY DAY 90 tablet 2   No current facility-administered medications on file prior to visit.    Allergies  Allergen Reactions  . Coricidin Hbp Cough-Cold [Chlorpheniramine-Dm] Hives    Objective:  General: Alert and oriented x3 in no acute distress  Dermatology: No open lesions bilateral lower extremities, no webspace macerations, no ecchymosis bilateral, all nails x 10 are well manicured.  Vascular: Dorsalis Pedis and Posterior Tibial pedal pulses palpable, Capillary Fill Time 5 seconds,(+) pedal hair growth bilateral, Trace edema at right ankle, Temperature gradient within normal limits.  Neurology: Michaell Cowing sensation intact via light touch bilateral.  Musculoskeletal: Mild to moderate tenderness with palpation at PT tendon course on the right. + Pes planus.   Gait: Antalgic gait  Xrays  Right Foot/ankle   Impression:Joint space narrowing consistent with arthritis at ankle and midfoot. Midtarsal breech supportive of pes planus.   Assessment and Plan: Problem List Items Addressed This Visit   None  Visit Diagnoses    Tendonitis    -  Primary   Relevant Medications   predniSONE (STERAPRED UNI-PAK 21 TAB) 10 MG (21) TBPK tablet   meloxicam (MOBIC) 15 MG tablet   triamcinolone acetonide (KENALOG) 10 MG/ML injection 10 mg (Start on 08/06/2020  9:30 AM)   PTTD (posterior tibial tendon dysfunction)       Pes planus of both feet       Arthritis of foot       Relevant Medications   predniSONE (STERAPRED UNI-PAK 21 TAB) 10 MG (21) TBPK tablet   meloxicam (MOBIC) 15 MG tablet   triamcinolone acetonide (KENALOG) 10 MG/ML injection 10  mg (Start on 08/06/2020  9:30 AM)   Pain in joint, ankle and foot, right           -Complete examination performed -Xrays reviewed -Discussed treatment options -Rx Prednisone and Mobic to take as directed  -After oral consent and aseptic prep, injected a mixture containing 1 ml of 2%  plain lidocaine, 1 ml 0.5% plain marcaine, 0.5 ml of kenalog 10 and 0.5 ml of dexamethasone phosphate into right medial ankle without complication. Post-injection care discussed with patient.  -Dispensed ankle ganulet to use as directed on right foot/ankle -Recommend good supportive shoes daily for foot type -Recommend icing as directed -Patient to return to office in 4-5 weeks or sooner if condition worsens.  Asencion Islam, DPM

## 2020-08-07 ENCOUNTER — Other Ambulatory Visit: Payer: Self-pay | Admitting: Sports Medicine

## 2020-08-07 DIAGNOSIS — M779 Enthesopathy, unspecified: Secondary | ICD-10-CM

## 2020-08-17 IMAGING — DX DG CHEST 1V PORT
1 series · 1 of 1 positions shown · non-contrast
Comparison: 08/24/2019

CLINICAL DATA: Reason for exam: s/p aortic valve replacement with
bioprosthetic vavlve + CABG x 2

EXAM:
PORTABLE CHEST 1 VIEW

[chest]
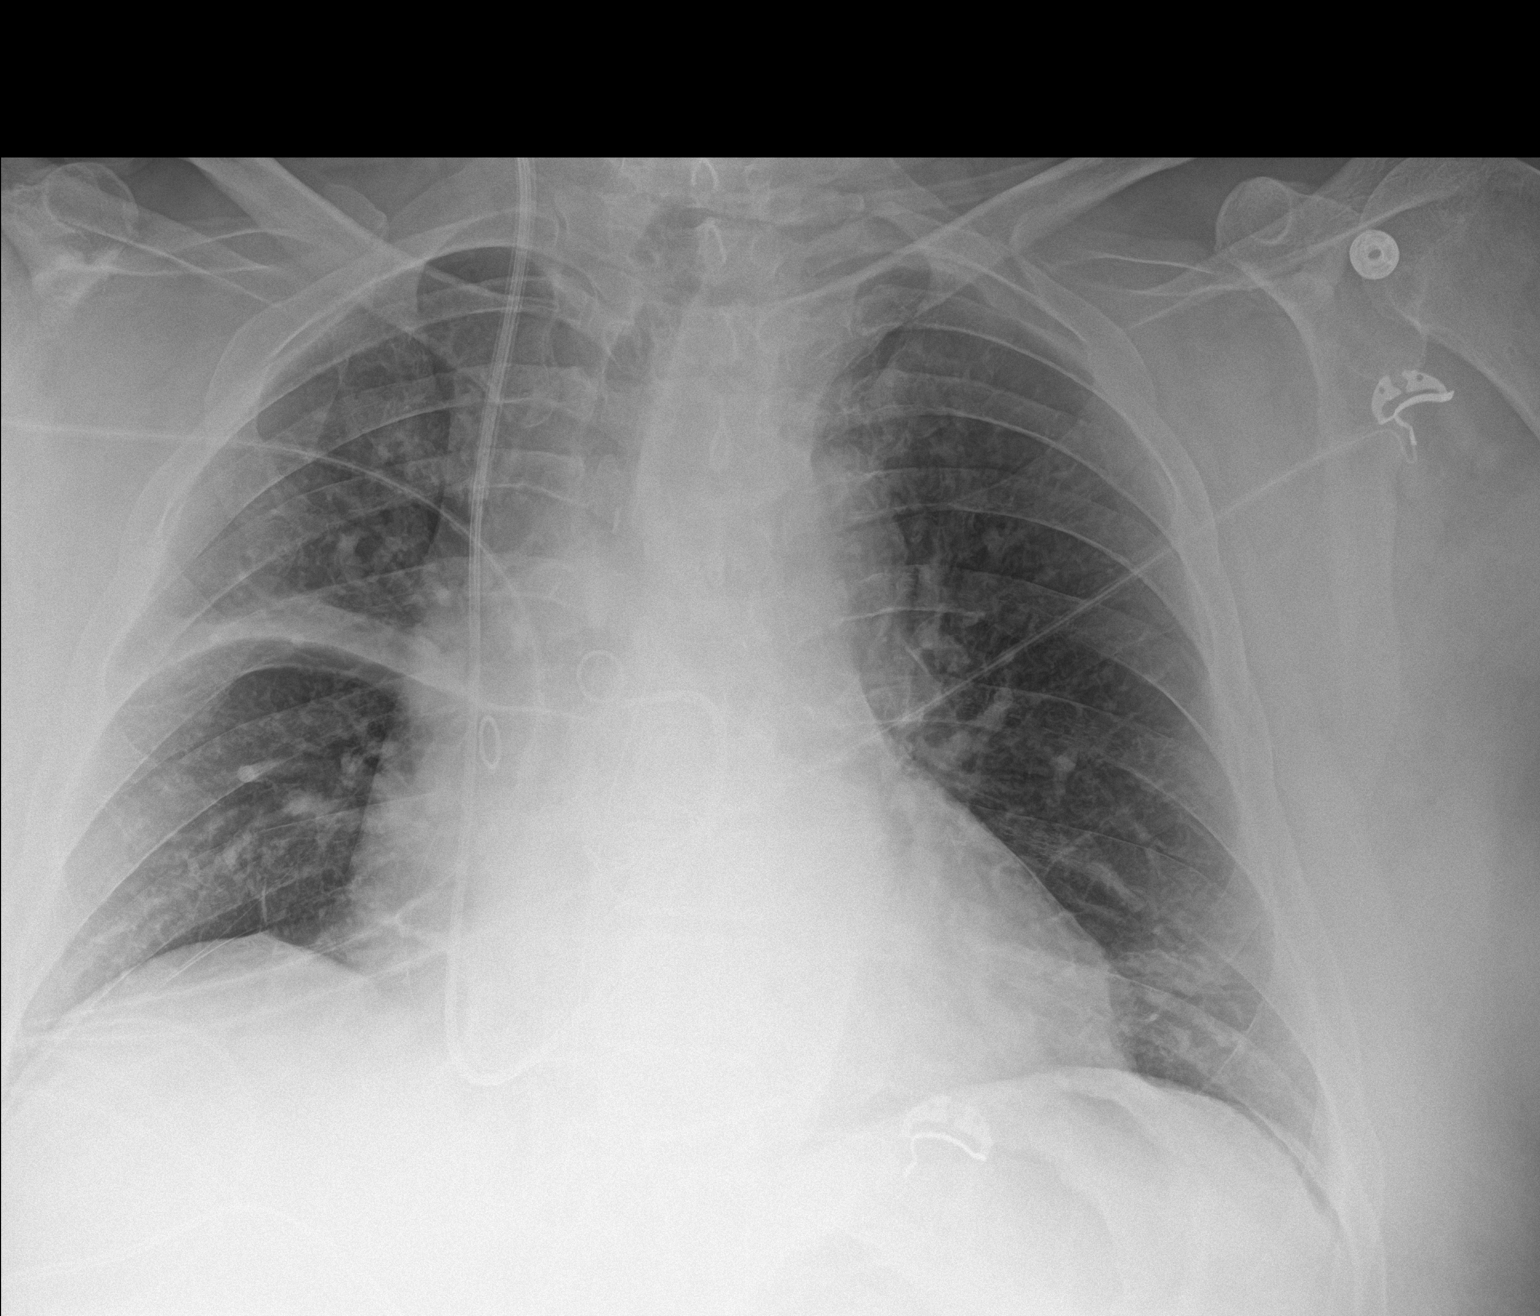

[1 of 1 positions shown; findings below may reference images not displayed]

FINDINGS: Interval extubation and removal of NG tube. Swan-Ganz catheter
remains.

Mild atelectasis in the RIGHT upper lobe. No effusion, infiltrate or
pneumothorax.
IMPRESSION: Extubation without complication

## 2020-08-19 IMAGING — DX DG CHEST 1V
1 series · 1 of 1 positions shown · non-contrast
Comparison: 08/26/2019

CLINICAL DATA: Post cardiac valve replacement and CABG.

EXAM:
CHEST  1 VIEW

[chest ap]
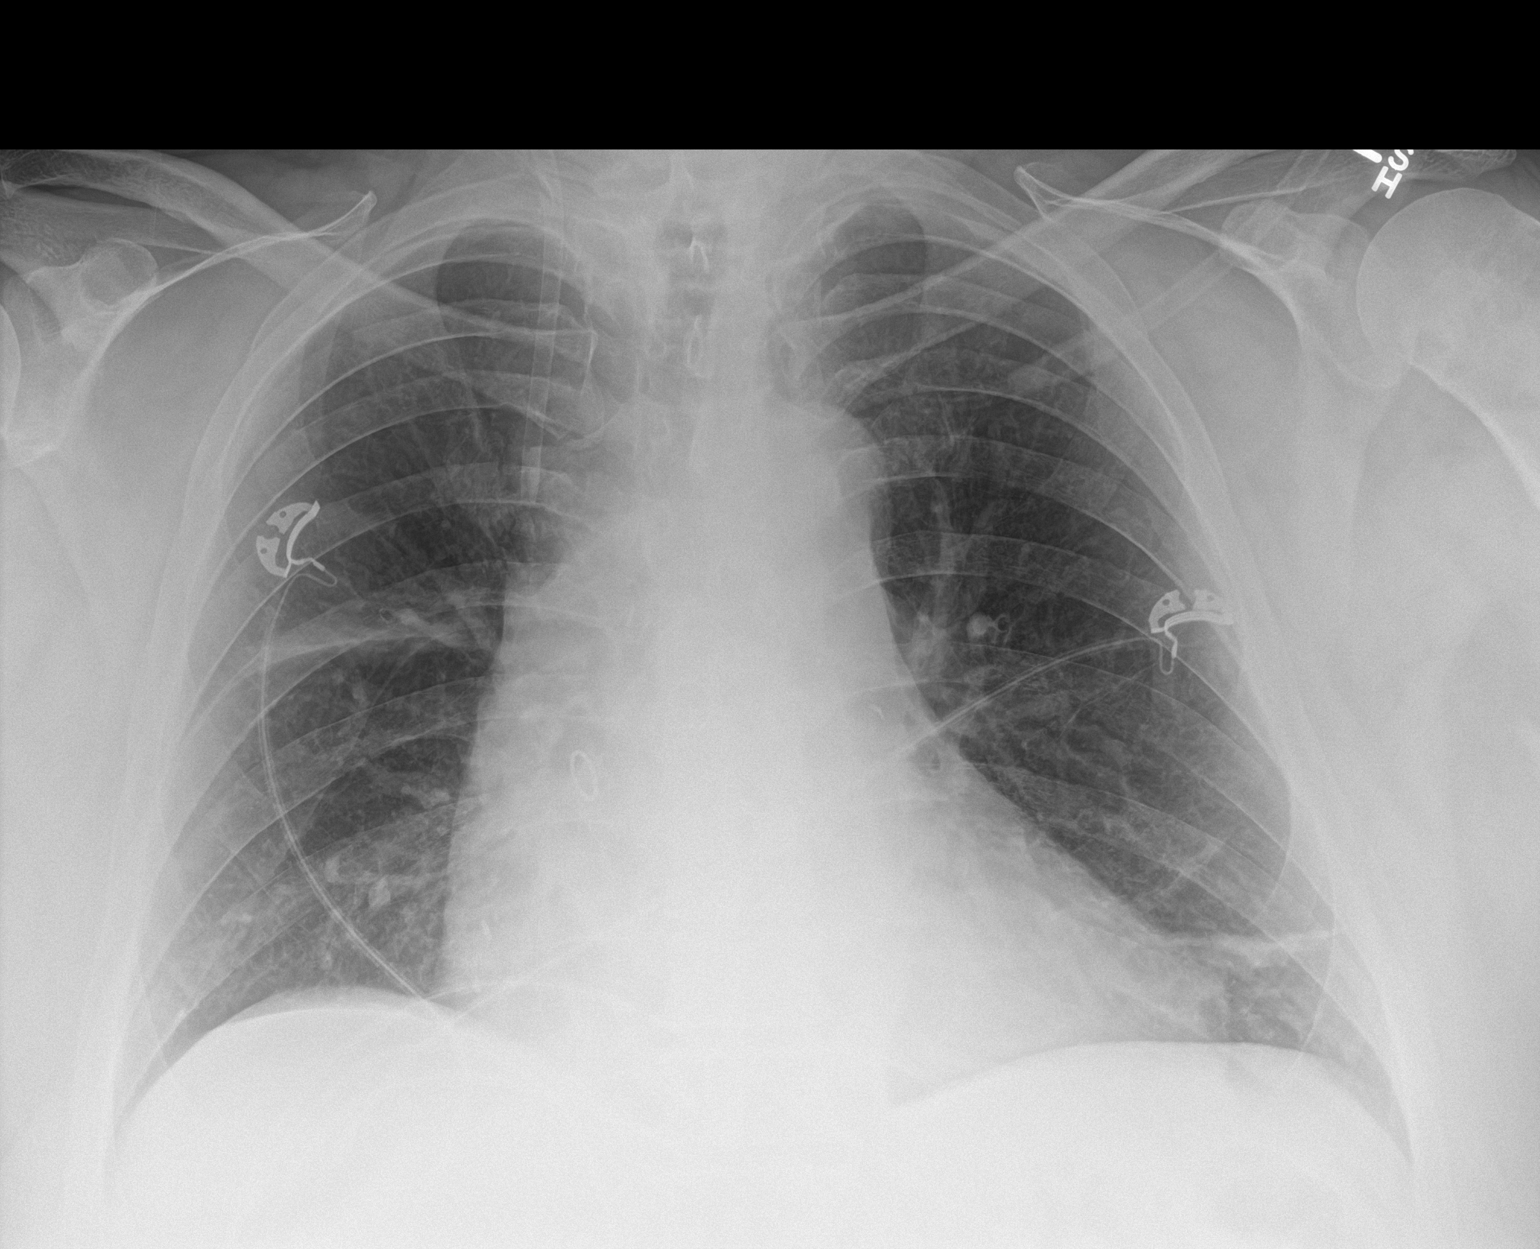

[1 of 1 positions shown; findings below may reference images not displayed]

FINDINGS: Right jugular central line is present. Cardiomediastinal silhouette
remains prominent but unchanged. Stable bandlike density at the left
lung base is suggestive for atelectasis. Decreased atelectasis in
the mid right lung. Negative for a pneumothorax. No frank pulmonary
edema.
IMPRESSION: 1. Decreased atelectasis in the right lung.
2. Minimal change in left basilar atelectasis.

## 2020-08-20 IMAGING — DX DG CHEST 2V
2 series · 2 of 2 positions shown · non-contrast
Comparison: 08/27/2019

CLINICAL DATA: Post open heart surgery 4-5 days ago.

EXAM:
CHEST - 2 VIEW

[chest pa]
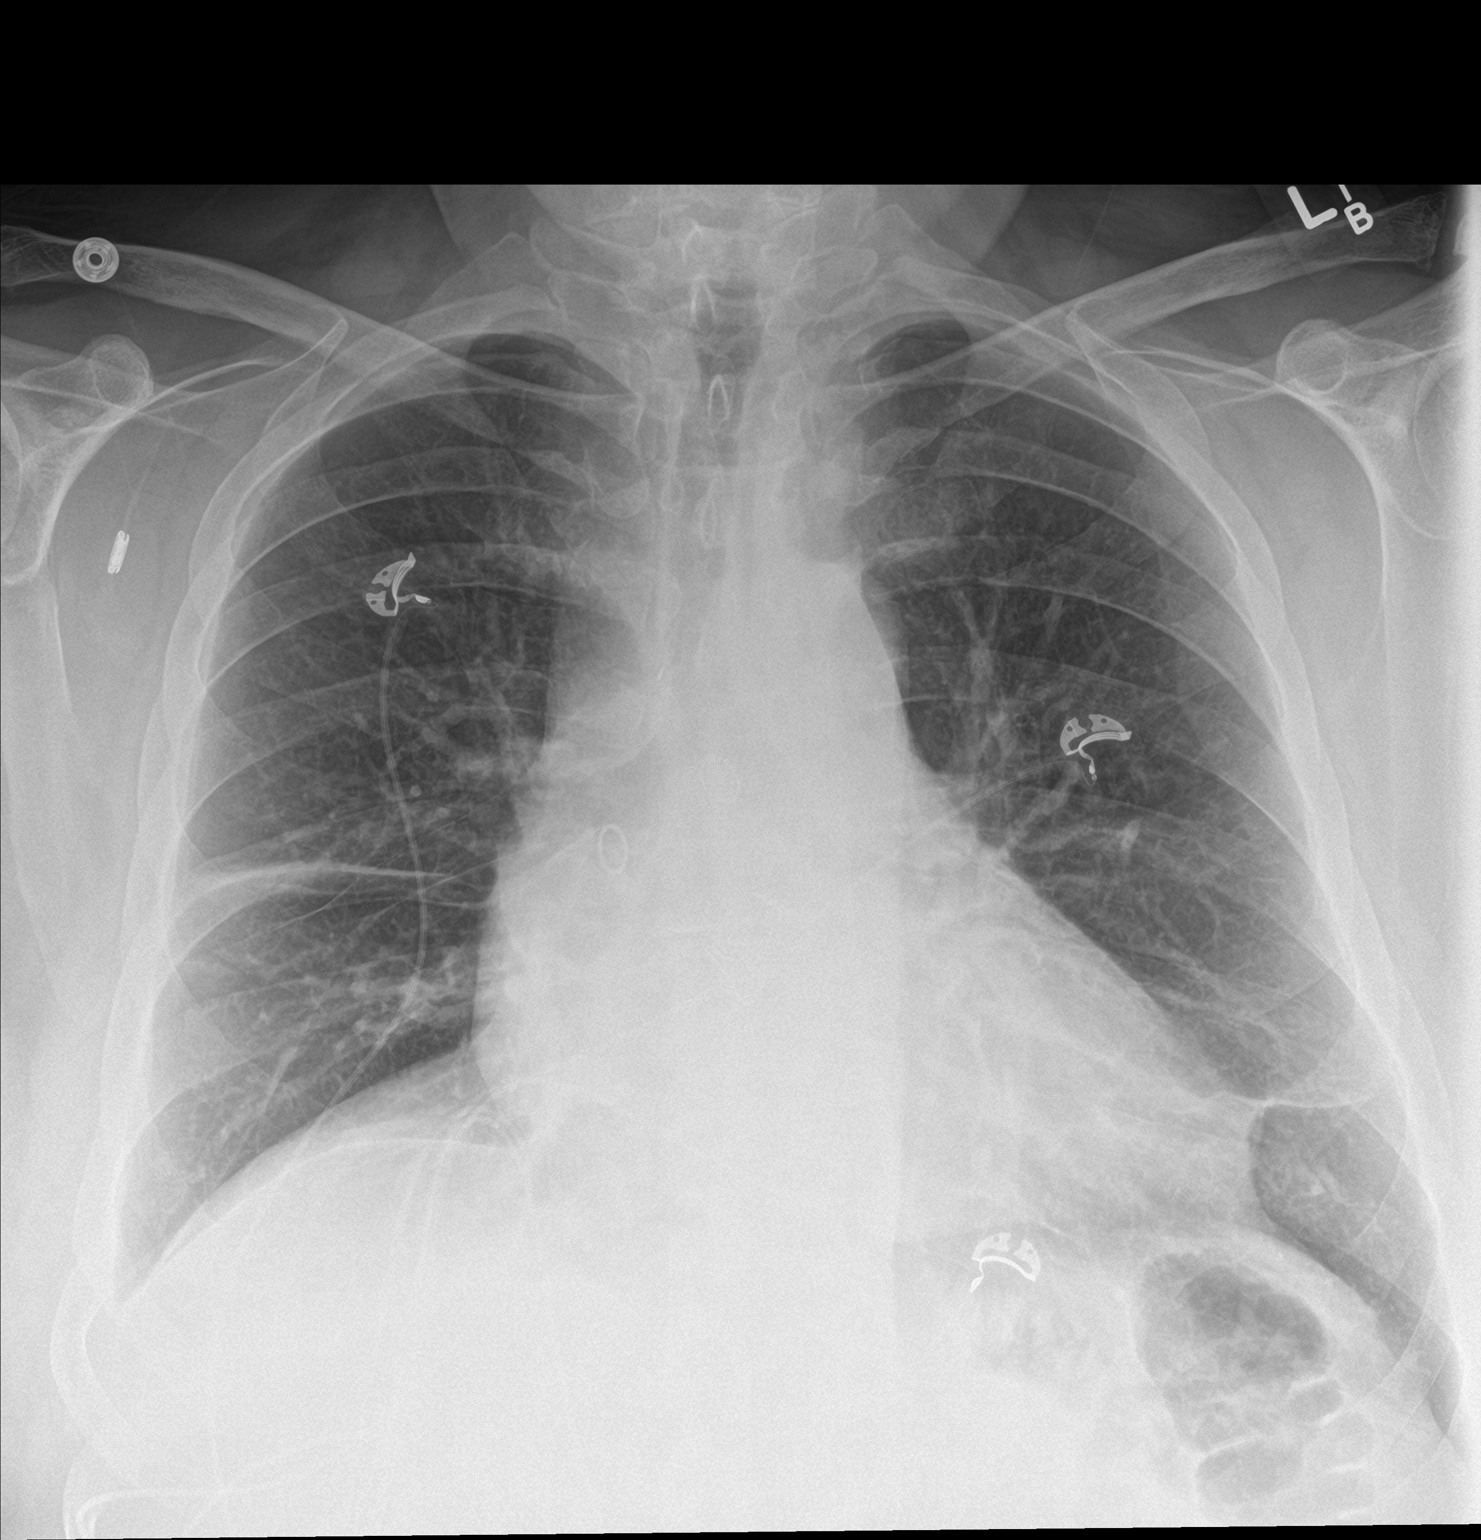

[chest lat]
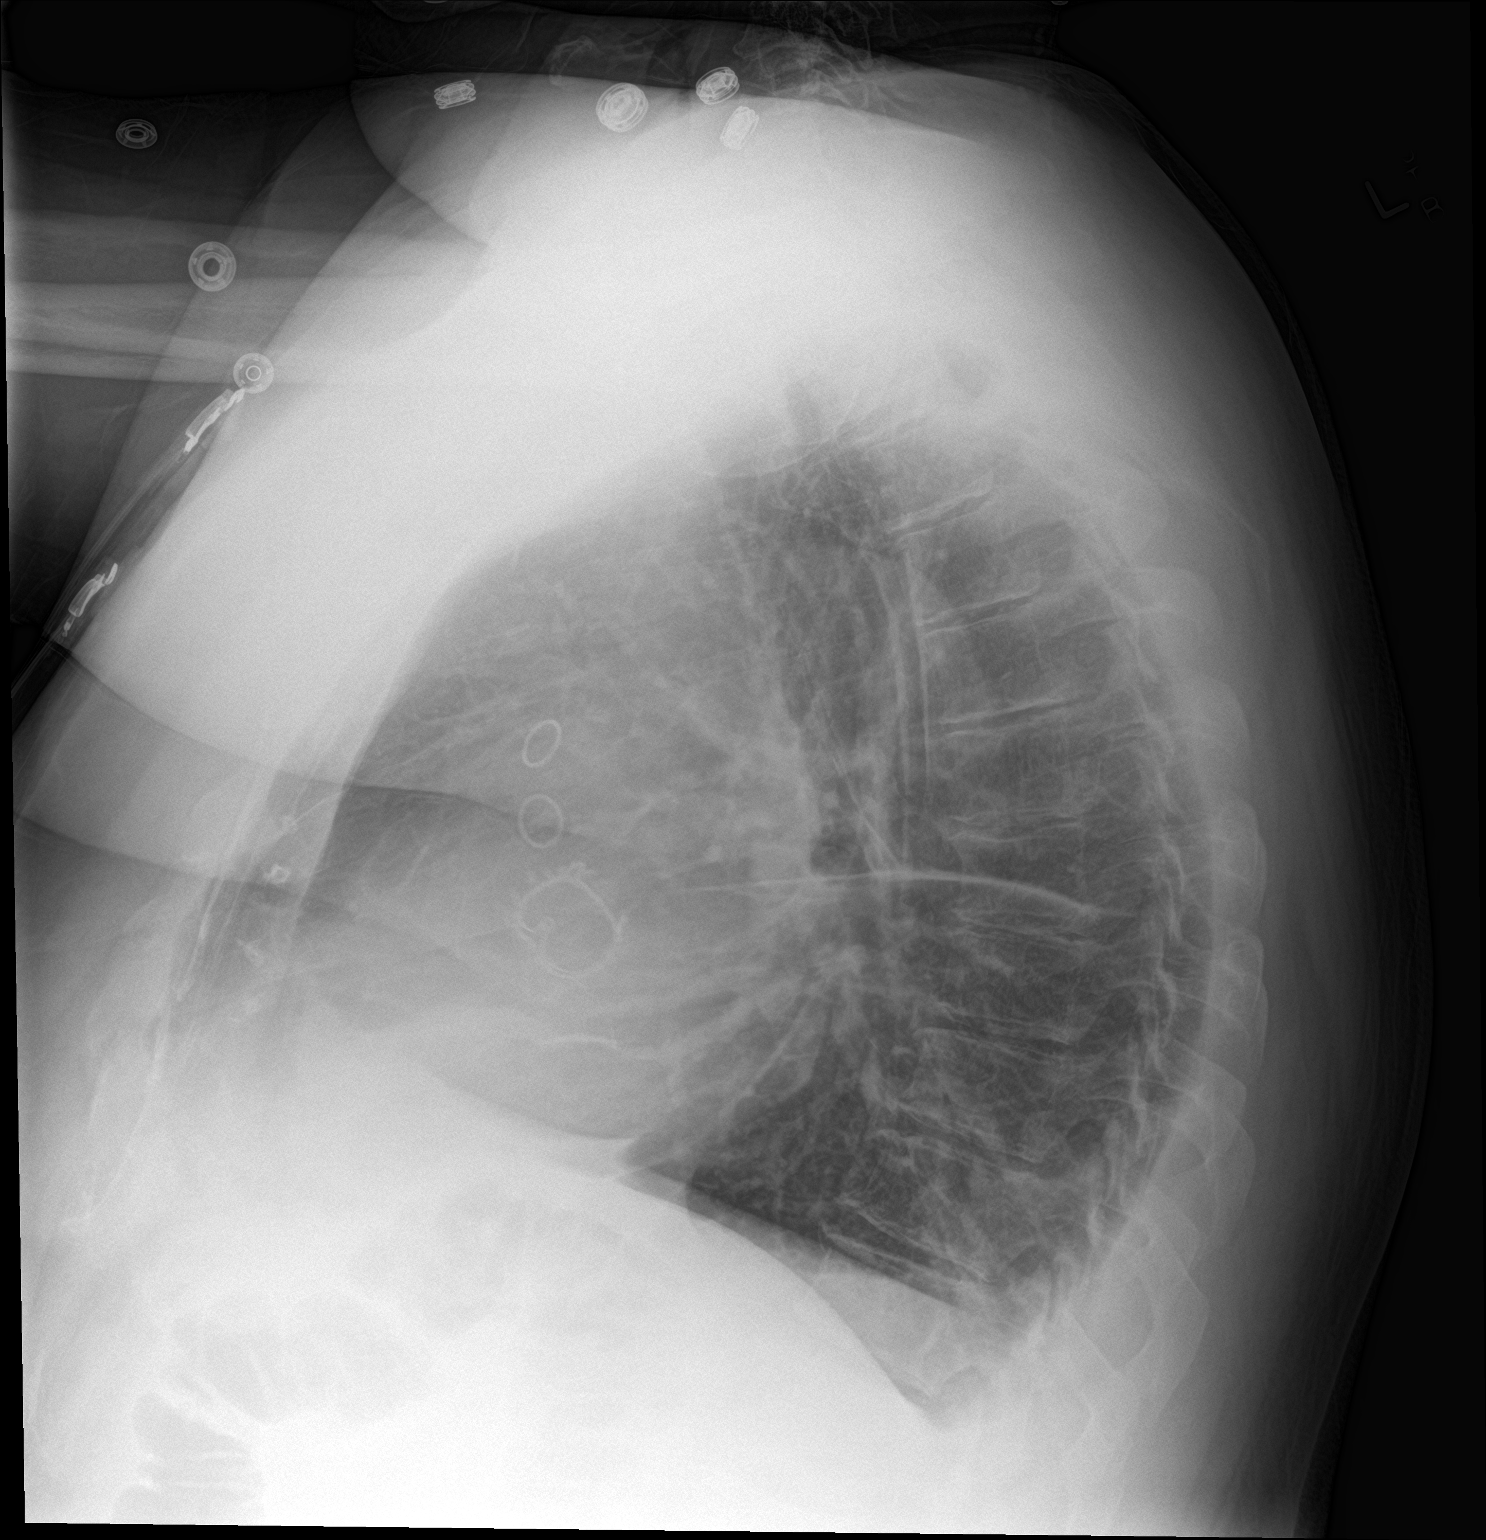

[2 of 2 positions shown; findings below may reference images not displayed]

FINDINGS: Lungs are adequately inflated with linear density over the right
midlung and left base unchanged likely atelectasis. No focal
consolidation or effusion. Flattening of the hemidiaphragms on the
lateral film. Stable cardiomegaly. Remainder of the exam is
unchanged.
IMPRESSION: Stable linear atelectasis over the right midlung and left base.

Stable cardiomegaly.

## 2020-08-29 ENCOUNTER — Encounter: Payer: Self-pay | Admitting: *Deleted

## 2020-09-04 ENCOUNTER — Other Ambulatory Visit: Payer: Self-pay | Admitting: Cardiology

## 2020-09-08 ENCOUNTER — Ambulatory Visit: Payer: 59 | Admitting: Thoracic Surgery (Cardiothoracic Vascular Surgery)

## 2020-09-10 ENCOUNTER — Ambulatory Visit: Payer: 59 | Admitting: Sports Medicine

## 2020-09-29 ENCOUNTER — Other Ambulatory Visit: Payer: Self-pay | Admitting: Cardiology

## 2020-09-29 DIAGNOSIS — Z953 Presence of xenogenic heart valve: Secondary | ICD-10-CM

## 2020-09-29 DIAGNOSIS — I952 Hypotension due to drugs: Secondary | ICD-10-CM

## 2020-09-29 DIAGNOSIS — E7841 Elevated Lipoprotein(a): Secondary | ICD-10-CM

## 2020-10-02 ENCOUNTER — Other Ambulatory Visit: Payer: Self-pay | Admitting: Cardiology

## 2020-10-02 ENCOUNTER — Ambulatory Visit: Payer: 59 | Admitting: Thoracic Surgery (Cardiothoracic Vascular Surgery)

## 2020-10-02 DIAGNOSIS — Z953 Presence of xenogenic heart valve: Secondary | ICD-10-CM

## 2020-10-02 DIAGNOSIS — I952 Hypotension due to drugs: Secondary | ICD-10-CM

## 2020-10-02 DIAGNOSIS — E7841 Elevated Lipoprotein(a): Secondary | ICD-10-CM

## 2020-10-06 ENCOUNTER — Ambulatory Visit: Payer: 59 | Admitting: Thoracic Surgery (Cardiothoracic Vascular Surgery)

## 2020-10-11 ENCOUNTER — Other Ambulatory Visit: Payer: Self-pay | Admitting: Cardiology

## 2020-10-14 NOTE — Telephone Encounter (Signed)
Refill sent to pharmacy.   

## 2020-11-02 ENCOUNTER — Other Ambulatory Visit: Payer: Self-pay | Admitting: Cardiology

## 2020-11-02 DIAGNOSIS — E7841 Elevated Lipoprotein(a): Secondary | ICD-10-CM

## 2020-11-02 DIAGNOSIS — I952 Hypotension due to drugs: Secondary | ICD-10-CM

## 2020-11-02 DIAGNOSIS — Z953 Presence of xenogenic heart valve: Secondary | ICD-10-CM

## 2020-11-03 NOTE — Telephone Encounter (Signed)
Refill sent to pharmacy.   

## 2020-11-30 ENCOUNTER — Other Ambulatory Visit: Payer: Self-pay | Admitting: Cardiology

## 2020-11-30 DIAGNOSIS — E7841 Elevated Lipoprotein(a): Secondary | ICD-10-CM

## 2020-11-30 DIAGNOSIS — Z953 Presence of xenogenic heart valve: Secondary | ICD-10-CM

## 2020-11-30 DIAGNOSIS — I952 Hypotension due to drugs: Secondary | ICD-10-CM

## 2020-12-01 NOTE — Telephone Encounter (Signed)
Carvedilol 12.5 mg # 60 x 3 refills  Losartan 50 mg # 30 x 3 refills Sent to [id number] - [place], [place] - [address]

## 2020-12-04 ENCOUNTER — Other Ambulatory Visit: Payer: Self-pay | Admitting: Cardiology

## 2020-12-14 ENCOUNTER — Other Ambulatory Visit: Payer: Self-pay | Admitting: Cardiology

## 2020-12-15 NOTE — Telephone Encounter (Signed)
Rx sent 

## 2021-01-29 ENCOUNTER — Ambulatory Visit: Payer: 59 | Admitting: Cardiology

## 2021-01-29 ENCOUNTER — Other Ambulatory Visit: Payer: Self-pay

## 2021-01-29 ENCOUNTER — Encounter: Payer: Self-pay | Admitting: Cardiology

## 2021-01-29 VITALS — BP 144/100 | HR 71 | Ht 74.0 in | Wt 335.4 lb

## 2021-01-29 DIAGNOSIS — Z953 Presence of xenogenic heart valve: Secondary | ICD-10-CM

## 2021-01-29 DIAGNOSIS — E7841 Elevated Lipoprotein(a): Secondary | ICD-10-CM

## 2021-01-29 DIAGNOSIS — I10 Essential (primary) hypertension: Secondary | ICD-10-CM | POA: Diagnosis not present

## 2021-01-29 DIAGNOSIS — Z951 Presence of aortocoronary bypass graft: Secondary | ICD-10-CM

## 2021-01-29 DIAGNOSIS — E7800 Pure hypercholesterolemia, unspecified: Secondary | ICD-10-CM | POA: Diagnosis not present

## 2021-01-29 MED ORDER — TELMISARTAN 40 MG PO TABS
40.0000 mg | ORAL_TABLET | Freq: Every day | ORAL | 3 refills | Status: DC
Start: 2021-01-29 — End: 2021-10-20

## 2021-01-29 NOTE — Progress Notes (Signed)
Cardiology Office Note:    Date:  01/29/2021   ID:  Antonio Riley, DOB [birth year], MRN [id number]  PCP:  Patient, No Pcp Per (Inactive)  Cardiologist:  Shirlee More, MD    Referring MD: No ref. provider found    ASSESSMENT:    1. Resistant hypertension   2. S/P aortic valve replacement with bioprosthetic valve   3. S/P CABG x 2   4. Pure hypercholesterolemia   5. Elevated lipoprotein A level    PLAN:    In order of problems listed above:  His primary problem today is resistant hypertension previously had taken a high potency ARB and trouble after valve replacement hypotension with transition back to telmisartan will contact me MyChart in 2 weeks if blood pressure remains greater than 140/90.  At that point I would ask him to start clonidine at bedtime.  Continue beta-blocker loop diuretic MRA Stable post AVR Follow-up echocardiogram at year 5 for follow-up and continue endocarditis prophylaxis Stable no angina continue treatment aspirin beta-blocker and lipid-lowering High risk goal LDL less than 70 may require combined therapy with CAD   Next appointment: 6 months   Medication Adjustments/Labs and Tests Ordered: Current medicines are reviewed at length with the patient today.  Concerns regarding medicines are outlined above.  Orders Placed This Encounter  Procedures   Comp Met (CMET)   Lipid panel   EKG 12-Lead   No orders of the defined types were placed in this encounter.   Chief Complaint  Patient presents with   Follow-up    After AVR and CABG  He is concerned regarding his blood pressure being elevated diastolic 79-390  History of Present Illness:    Antonio Riley is a 55 y.o. male with a hx of severe symptomatic aortic stenosis with aortic valve replacement with a stented bovine pericardial tissue valve and coronary artery bypass surgery 08/24/2019, he had postoperative atrial fibrillation resistant hypertension hyperlipidemia and elevated  lipoprotein LPa  last seen 07/29/2020.  Compliance with diet, lifestyle and medications: Yes With financial barriers to transition back to rosuvastatin daily.  Tolerates well with no muscle pain or weakness he is due for labs we will check a lipid profile today No cardiovascular symptoms of edema shortness of breath orthopnea chest pain palpitation or syncope no fever or chills. Home blood pressure typically runs 130-140/90-100.  His initial postoperative echocardiogram showed no valvular dysfunction of his bioprosthetic AVR enlargement ascending aorta 41 millimeters normal left ventricular function with severe concentric LVH elevated filling pressures normal right ventricular size and function pulmonary artery pressure and moderate left atrial enlargement.  10/24/2020:  1. Left ventricular ejection fraction, by estimation, is 60 to 65%. The  left ventricle has normal function. The left ventricle has no regional  wall motion abnormalities. There is severe concentric left ventricular  hypertrophy. Left ventricular diastolic   parameters are consistent with Grade II diastolic dysfunction  (pseudonormalization).   2. Right ventricular systolic function is normal. The right ventricular  size is normal. There is normal pulmonary artery systolic pressure.   3. Left atrial size was moderately dilated.   4. The mitral valve is normal in structure. Trivial mitral valve  regurgitation. No evidence of mitral stenosis.   5. Well seated Inspiris 25 mm bioprosthetic valve with no evidence of  stenosis. Aortic valve regurgitation is not visualized.   6. Aneurysm of the ascending aorta, measuring 41 mm.   7. The inferior vena cava is normal in size with greater than 50%  respiratory variability, suggesting right atrial pressure of 3 mmHg Past Medical History:  Diagnosis Date   Anginal pain (HCC)    Aortic stenosis    BMI 40.0-44.9, adult (HCC)    Coronary artery disease    Heart murmur     Hypertension    Knee pain 03/15/2018   S/P aortic valve replacement with bioprosthetic valve 08/24/2019   25 mm Edwards Inspiris Resilia stented bovine pericardial tissue valve   S/P CABG x 2 08/24/2019   SVG to OM, SVG to RCA, EVH via bilateral thighs   Swelling of lower leg     Past Surgical History:  Procedure Laterality Date   AORTIC VALVE REPLACEMENT N/A 08/24/2019   Procedure: AORTIC VALVE REPLACEMENT (AVR) USING INSPIRIS VALVE SIZE 25MM;  Surgeon: Rexene Alberts, MD;  Location: [place];  Service: Open Heart Surgery;  Laterality: N/A;   CORONARY ARTERY BYPASS GRAFT N/A 08/24/2019   Procedure: CORONARY ARTERY BYPASS GRAFTING (CABG) x two, using bilateral leg greater saphenous veins harvested endoscopically;  Surgeon: Rexene Alberts, MD;  Location: [facility];  Service: Open Heart Surgery;  Laterality: N/A;   FEMUR SURGERY     RIGHT/LEFT HEART CATH AND CORONARY ANGIOGRAPHY N/A 07/31/2019   Procedure: RIGHT/LEFT HEART CATH AND CORONARY ANGIOGRAPHY;  Surgeon: Sherren Mocha, MD;  Location: [facility];  Service: Cardiovascular;  Laterality: N/A;   TEE WITHOUT CARDIOVERSION N/A 08/24/2019   Procedure: TRANSESOPHAGEAL ECHOCARDIOGRAM (TEE);  Surgeon: Rexene Alberts, MD;  Location: [facility];  Service: Open Heart Surgery;  Laterality: N/A;   WRIST SURGERY      Current Medications: Current Meds  Medication Sig   amoxicillin (AMOXIL) 500 MG capsule Take 4 capsules (2,000 mg) 45 minutes before dental procedures.   ASPIRIN LOW DOSE 81 MG EC tablet TAKE 1 TABLET (81 MG TOTAL) BY MOUTH DAILY. SWALLOW WHOLE.   Calcium Carbonate-Vitamin D (CALCIUM 600/VITAMIN D PO) Take 1 tablet by mouth daily.   carvedilol (COREG) 12.5 MG tablet TAKE 1 TABLET BY MOUTH TWICE A DAY   furosemide (LASIX) 20 MG tablet TAKE 1 TABLET (20 MG TOTAL) BY MOUTH 3 (THREE) TIMES A WEEK.   losartan (COZAAR) 50 MG tablet TAKE 1 TABLET BY MOUTH EVERY DAY   omeprazole (PRILOSEC OTC) 20 MG tablet Take 20 mg by mouth daily.   OVER THE  COUNTER MEDICATION Take 1 tablet by mouth daily. Allergy Medicine   rosuvastatin (CRESTOR) 20 MG tablet TAKE 1 TABLET BY MOUTH EVERY DAY   spironolactone (ALDACTONE) 25 MG tablet TAKE 1 TABLET BY MOUTH EVERY DAY   Current Facility-Administered Medications for the 01/29/21 encounter (Office Visit) with Richardo Priest, MD  Medication   triamcinolone acetonide (KENALOG) 10 MG/ML injection 10 mg     Allergies:   Coricidin hbp cough-cold [chlorpheniramine-dm]   Social History   Socioeconomic History   Marital status: Married    Spouse name: Not on file   Number of children: Not on file   Years of education: Not on file   Highest education level: Not on file  Occupational History   Not on file  Tobacco Use   Smoking status: Former    Packs/day: 0.25    Years: 28.00    Pack years: 7.00    Types: Cigarettes    Quit date: 08/21/2019    Years since quitting: 1.4   Smokeless tobacco: Never   Tobacco comments:    trying to quit  Vaping Use   Vaping Use: Never used  Substance  and Sexual Activity   Alcohol use: Yes    Comment: occ beer    Drug use: Not Currently   Sexual activity: Not on file  Other Topics Concern   Not on file  Social History Narrative   Not on file   Social Determinants of Health   Financial Resource Strain: Not on file  Food Insecurity: Not on file  Transportation Needs: Not on file  Physical Activity: Not on file  Stress: Not on file  Social Connections: Not on file     Family History: The patient's family history includes CAD in his mother; Deep vein thrombosis in his mother; Diabetes in his cousin and mother; Heart murmur in his mother. ROS:   Please see the history of present illness.    All other systems reviewed and are negative.  EKGs/Labs/Other Studies Reviewed:    The following studies were reviewed today:  His EKG today shows sinus rhythm normal 71 bpm  Recent Labs: No results found for requested labs within last 8760 hours.  Recent  Lipid Panel    Component Value Date/Time   CHOL 118 10/25/2019 0947   TRIG 85 10/25/2019 0947   HDL 42 10/25/2019 0947   CHOLHDL 2.8 10/25/2019 0947   LDLCALC 59 10/25/2019 0947    Physical Exam:    VS:  BP (!) 144/100 (BP Location: Right Arm, Patient Position: Sitting)   Pulse 71   Ht '6\' 2"'  (1.88 m)   Wt (!) 335 lb 6.4 oz (152.1 kg)   SpO2 97%   BMI 43.06 kg/m     Wt Readings from Last 3 Encounters:  01/29/21 (!) 335 lb 6.4 oz (152.1 kg)  07/29/20 (!) 341 lb 3.2 oz (154.8 kg)  12/14/19 (!) 337 lb 6.4 oz (153 kg)     GEN: Obese BMI 43 well nourished, well developed in no acute distress HEENT: Normal NECK: No JVD; No carotid bruits LYMPHATICS: No lymphadenopathy CARDIAC: Normal exam for AVR RRR, no murmurs, rubs, gallops RESPIRATORY:  Clear to auscultation without rales, wheezing or rhonchi  ABDOMEN: Soft, non-tender, non-distended MUSCULOSKELETAL:  No edema; No deformity  SKIN: Warm and dry NEUROLOGIC:  Alert and oriented x 3 PSYCHIATRIC:  Normal affect    Signed, Shirlee More, MD  01/29/2021 4:17 PM    [facility]

## 2021-01-29 NOTE — Patient Instructions (Addendum)
Medication Instructions:  Your physician has recommended you make the following change in your medication:  STOP: Losartan  START: Telmisartan 40 mg take one tablet by mouth daily.  *If you need a refill on your cardiac medications before your next appointment, please call your pharmacy*   Lab Work: Your physician recommends that you return for lab work in: TODAY CMP, Lipids If you have labs (blood work) drawn today and your tests are completely normal, you will receive your results only by: MyChart Message (if you have MyChart) OR A paper copy in the mail If you have any lab test that is abnormal or we need to change your treatment, we will call you to review the results.   Testing/Procedures: None   Follow-Up: At [facility], you and your health needs are our priority.  As part of our continuing mission to provide you with exceptional heart care, we have created designated Provider Care Teams.  These Care Teams include your primary Cardiologist (physician) and Advanced Practice Providers (APPs -  Physician Assistants and Nurse Practitioners) who all work together to provide you with the care you need, when you need it.  We recommend signing up for the patient portal called "MyChart".  Sign up information is provided on this After Visit Summary.  MyChart is used to connect with patients for Virtual Visits (Telemedicine).  Patients are able to view lab/test results, encounter notes, upcoming appointments, etc.  Non-urgent messages can be sent to your provider as well.   To learn more about what you can do with MyChart, go to [email or web address].    Your next appointment:   9 month(s)  The format for your next appointment:   In Person  Provider:   Norman Herrlich, MD   Other Instructions Please take your blood pressure daily for 2 weeks and send [place] a list of these readings over your MyChart if it remains >140 on top.

## 2021-01-30 LAB — COMPREHENSIVE METABOLIC PANEL
ALT: 22 IU/L (ref 0–44)
AST: 25 IU/L (ref 0–40)
Albumin/Globulin Ratio: 1.5 (ref 1.2–2.2)
Albumin: 4.5 g/dL (ref 3.8–4.9)
Alkaline Phosphatase: 62 IU/L (ref 44–121)
BUN/Creatinine Ratio: 12 (ref 9–20)
BUN: 13 mg/dL (ref 6–24)
Bilirubin Total: 0.6 mg/dL (ref 0.0–1.2)
CO2: 23 mmol/L (ref 20–29)
Calcium: 9.5 mg/dL (ref 8.7–10.2)
Chloride: 98 mmol/L (ref 96–106)
Creatinine, Ser: 1.05 mg/dL (ref 0.76–1.27)
Globulin, Total: 3.1 g/dL (ref 1.5–4.5)
Glucose: 90 mg/dL (ref 65–99)
Potassium: 5 mmol/L (ref 3.5–5.2)
Sodium: 135 mmol/L (ref 134–144)
Total Protein: 7.6 g/dL (ref 6.0–8.5)
eGFR: 84 mL/min/{1.73_m2} (ref >59)

## 2021-01-30 LAB — LIPID PANEL
Chol/HDL Ratio: 3 ratio (ref 0.0–5.0)
Cholesterol, Total: 124 mg/dL (ref 100–199)
HDL: 41 mg/dL (ref >39)
LDL Chol Calc (NIH): 63 mg/dL (ref 0–99)
Triglycerides: 110 mg/dL (ref 0–149)
VLDL Cholesterol Cal: 20 mg/dL (ref 5–40)

## 2021-02-02 ENCOUNTER — Telehealth: Payer: Self-pay

## 2021-02-02 NOTE — Telephone Encounter (Signed)
-----   Message from Kardie Tobb, DO sent at 02/01/2021  7:48 PM EDT ----- This is Dr. Munley's patient called potassium slightly elevated.  Advised the patient to cut back on foods that are high in potassium.  Repeat blood work in 2 weeks. 

## 2021-02-02 NOTE — Telephone Encounter (Signed)
Left message on patients voicemail to please return our call.   

## 2021-02-03 ENCOUNTER — Telehealth: Payer: Self-pay

## 2021-02-03 DIAGNOSIS — I10 Essential (primary) hypertension: Secondary | ICD-10-CM

## 2021-02-03 NOTE — Telephone Encounter (Signed)
-----   Message from Thomasene Ripple, DO sent at 02/01/2021  7:48 PM EDT ----- This is Dr. Hulen Shouts patient called potassium slightly elevated.  Advised the patient to cut back on foods that are high in potassium.  Repeat blood work in 2 weeks.

## 2021-02-03 NOTE — Telephone Encounter (Signed)
Spoke with patient regarding results and recommendation.  Patient verbalizes understanding and is agreeable to plan of care. Advised patient to call back with any issues or concerns.  

## 2021-02-03 NOTE — Telephone Encounter (Signed)
-----   Message from Kardie Tobb, DO sent at 02/01/2021  7:48 PM EDT ----- This is Dr. Munley's patient called potassium slightly elevated.  Advised the patient to cut back on foods that are high in potassium.  Repeat blood work in 2 weeks. 

## 2021-02-03 NOTE — Telephone Encounter (Signed)
Pt is returning call for results 

## 2021-02-03 NOTE — Telephone Encounter (Signed)
Left message on patients voicemail to please return our call.   

## 2021-02-19 ENCOUNTER — Other Ambulatory Visit: Payer: Self-pay

## 2021-02-19 DIAGNOSIS — I10 Essential (primary) hypertension: Secondary | ICD-10-CM

## 2021-02-20 LAB — BASIC METABOLIC PANEL
BUN/Creatinine Ratio: 15 (ref 9–20)
BUN: 16 mg/dL (ref 6–24)
CO2: 24 mmol/L (ref 20–29)
Calcium: 9.4 mg/dL (ref 8.7–10.2)
Chloride: 100 mmol/L (ref 96–106)
Creatinine, Ser: 1.08 mg/dL (ref 0.76–1.27)
Glucose: 108 mg/dL — ABNORMAL HIGH (ref 70–99)
Potassium: 4.5 mmol/L (ref 3.5–5.2)
Sodium: 137 mmol/L (ref 134–144)
eGFR: 82 mL/min/{1.73_m2} (ref >59)

## 2021-03-03 ENCOUNTER — Other Ambulatory Visit: Payer: Self-pay | Admitting: Cardiology

## 2021-03-03 DIAGNOSIS — Z953 Presence of xenogenic heart valve: Secondary | ICD-10-CM

## 2021-03-03 DIAGNOSIS — E7841 Elevated Lipoprotein(a): Secondary | ICD-10-CM

## 2021-03-03 DIAGNOSIS — I952 Hypotension due to drugs: Secondary | ICD-10-CM

## 2021-07-19 ENCOUNTER — Other Ambulatory Visit: Payer: Self-pay | Admitting: Cardiology

## 2021-07-24 ENCOUNTER — Other Ambulatory Visit: Payer: Self-pay | Admitting: Cardiology

## 2021-07-24 DIAGNOSIS — I2510 Atherosclerotic heart disease of native coronary artery without angina pectoris: Secondary | ICD-10-CM

## 2021-09-18 ENCOUNTER — Other Ambulatory Visit: Payer: Self-pay

## 2021-09-18 ENCOUNTER — Telehealth: Payer: Self-pay | Admitting: Cardiology

## 2021-09-18 DIAGNOSIS — E7841 Elevated Lipoprotein(a): Secondary | ICD-10-CM

## 2021-09-18 DIAGNOSIS — Z953 Presence of xenogenic heart valve: Secondary | ICD-10-CM

## 2021-09-18 DIAGNOSIS — I952 Hypotension due to drugs: Secondary | ICD-10-CM

## 2021-09-18 MED ORDER — FUROSEMIDE 20 MG PO TABS: 20.0000 mg | ORAL_TABLET | ORAL | 3 refills | Status: DC

## 2021-09-18 NOTE — Telephone Encounter (Signed)
?*  STAT* If patient is at the pharmacy, call can be transferred to refill team. ? ? ?1. Which medications need to be refilled? (please list name of each medication and dose if known) furosemide (LASIX) 20 MG tablet (Expired) ? ?2. Which pharmacy/location (including street and city if local pharmacy) is medication to be sent to? [facility] - [place], [place] - [address] ? ?3. Do they need a 30 day or 90 day supply? 90 ? ?

## 2021-09-18 NOTE — Telephone Encounter (Signed)
Refilled Lasix - sent to [facility] as requested ?

## 2021-09-25 ENCOUNTER — Telehealth: Payer: Self-pay | Admitting: Nurse Practitioner

## 2021-09-25 NOTE — Telephone Encounter (Signed)
SENT NEW PATIENT INFORMATION THRU MYCHART ?

## 2021-10-20 ENCOUNTER — Other Ambulatory Visit: Payer: Self-pay | Admitting: Nurse Practitioner

## 2021-10-20 ENCOUNTER — Ambulatory Visit: Payer: 59 | Admitting: Nurse Practitioner

## 2021-10-20 ENCOUNTER — Encounter: Payer: Self-pay | Admitting: Nurse Practitioner

## 2021-10-20 VITALS — BP 148/94 | HR 72 | Temp 97.2°F | Ht 74.0 in | Wt 338.0 lb

## 2021-10-20 DIAGNOSIS — Z1211 Encounter for screening for malignant neoplasm of colon: Secondary | ICD-10-CM

## 2021-10-20 DIAGNOSIS — E782 Mixed hyperlipidemia: Secondary | ICD-10-CM

## 2021-10-20 DIAGNOSIS — J301 Allergic rhinitis due to pollen: Secondary | ICD-10-CM

## 2021-10-20 DIAGNOSIS — Z7689 Persons encountering health services in other specified circumstances: Secondary | ICD-10-CM

## 2021-10-20 DIAGNOSIS — Z2820 Immunization not carried out because of patient decision for unspecified reason: Secondary | ICD-10-CM

## 2021-10-20 DIAGNOSIS — Z953 Presence of xenogenic heart valve: Secondary | ICD-10-CM

## 2021-10-20 DIAGNOSIS — F17218 Nicotine dependence, cigarettes, with other nicotine-induced disorders: Secondary | ICD-10-CM

## 2021-10-20 DIAGNOSIS — I952 Hypotension due to drugs: Secondary | ICD-10-CM

## 2021-10-20 DIAGNOSIS — I10 Essential (primary) hypertension: Secondary | ICD-10-CM | POA: Diagnosis not present

## 2021-10-20 DIAGNOSIS — Z125 Encounter for screening for malignant neoplasm of prostate: Secondary | ICD-10-CM

## 2021-10-20 DIAGNOSIS — I2510 Atherosclerotic heart disease of native coronary artery without angina pectoris: Secondary | ICD-10-CM

## 2021-10-20 DIAGNOSIS — K219 Gastro-esophageal reflux disease without esophagitis: Secondary | ICD-10-CM

## 2021-10-20 DIAGNOSIS — Z6841 Body Mass Index (BMI) 40.0 and over, adult: Secondary | ICD-10-CM

## 2021-10-20 DIAGNOSIS — Z952 Presence of prosthetic heart valve: Secondary | ICD-10-CM

## 2021-10-20 DIAGNOSIS — E7841 Elevated Lipoprotein(a): Secondary | ICD-10-CM

## 2021-10-20 DIAGNOSIS — L932 Other local lupus erythematosus: Secondary | ICD-10-CM

## 2021-10-20 MED ORDER — ROSUVASTATIN CALCIUM 20 MG PO TABS
20.0000 mg | ORAL_TABLET | Freq: Every day | ORAL | 3 refills | Status: DC
Start: 2021-10-20 — End: 2022-10-25

## 2021-10-20 MED ORDER — CARVEDILOL 12.5 MG PO TABS: 12.5000 mg | ORAL_TABLET | Freq: Two times a day (BID) | ORAL | 2 refills | Status: DC

## 2021-10-20 MED ORDER — TELMISARTAN 40 MG PO TABS: 40.0000 mg | ORAL_TABLET | Freq: Every day | ORAL | 3 refills | Status: DC

## 2021-10-20 MED ORDER — FUROSEMIDE 20 MG PO TABS: 20.0000 mg | ORAL_TABLET | ORAL | 3 refills | Status: DC

## 2021-10-20 MED ORDER — FLUTICASONE PROPIONATE 50 MCG/ACT NA SUSP: 2.0000 | Freq: Every day | NASAL | 6 refills | Status: DC

## 2021-10-20 MED ORDER — OMEPRAZOLE 20 MG PO CPDR: 20.0000 mg | DELAYED_RELEASE_CAPSULE | Freq: Every day | ORAL | 3 refills | Status: DC

## 2021-10-20 MED ORDER — SPIRONOLACTONE 25 MG PO TABS: 25.0000 mg | ORAL_TABLET | Freq: Every day | ORAL | 1 refills | Status: DC

## 2021-10-20 NOTE — Progress Notes (Signed)
New Patient Office Visit  Subjective    Patient ID: Antonio Riley, male    DOB: 1966/02/24  Age: 56 y.o. MRN: [id number]  CC:  Chief Complaint  Patient presents with   Establish Care    HPI Antonio Riley presents to establish care with a PCP. Pt is followed by Dr Bettina Gavia, cardiologist for currently see's cardiology, Dr. Bettina Gavia for aortic valve stenosis and CAD. He underwent aortic valve replacement and CABG 2 vessels. He reports recent fatigue, decreased libido, and ED. Requested testosterone level. He is not fasting for today's appt. Agreed to return in a.m. He is past due for routine eye exam and screening colonoscopy. States he has a history of lupus, was treated several years ago. Has not followed-up with rheumatology in over 20 years.   CAD: Management includes coreg, lasix Saw cardiologist last 01/2021 He reports excellent compliance with treatment. He is not having side effects.  He is following a Regular diet. He is not exercising. He does smoke.  Use of agents associated with hypertension: NSAIDS.     Pertinent labs: Lab Results  Component Value Date   CHOL 124 01/29/2021   HDL 41 01/29/2021   LDLCALC 63 01/29/2021   TRIG 110 01/29/2021   CHOLHDL 3.0 01/29/2021   Lab Results  Component Value Date   NA 137 02/19/2021   K 4.5 02/19/2021   CREATININE 1.08 02/19/2021   EGFR 82 02/19/2021   GFRNONAA 68 12/14/2019   GLUCOSE 108 (H) 02/19/2021       Lipid/Cholesterol, Follow-up  Last lipid panel Other pertinent labs  Lab Results  Component Value Date   CHOL 124 01/29/2021   HDL 41 01/29/2021   LDLCALC 63 01/29/2021   TRIG 110 01/29/2021   CHOLHDL 3.0 01/29/2021   Lab Results  Component Value Date   ALT 22 01/29/2021   AST 25 01/29/2021   PLT 438 09/17/2019      Management includes Crestor 20 mg QD He reports excellent compliance with treatment. He is not having side effects.          Current diet: not asked Current exercise:  none  GERD  Current treatment consist YF:[id number] 20 mg QD  He reports excellent compliance with treatment. He is not having side effects. Marland Kitchen              He is NOT experiencing difficulty swallowing    Outpatient Encounter Medications as of 10/20/2021  Medication Sig   amoxicillin (AMOXIL) 500 MG capsule Take 4 capsules (2,000 mg) 45 minutes before dental procedures. (Patient not taking: Reported on 10/20/2021)   ASPIRIN LOW DOSE 81 MG EC tablet TAKE 1 TABLET (81 MG TOTAL) BY MOUTH DAILY. SWALLOW WHOLE.   Calcium Carbonate-Vitamin D (CALCIUM 600/VITAMIN D PO) Take 1 tablet by mouth daily.   carvedilol (COREG) 12.5 MG tablet TAKE 1 TABLET BY MOUTH TWICE A DAY   furosemide (LASIX) 20 MG tablet Take 1 tablet (20 mg total) by mouth 3 (three) times a week.   omeprazole (PRILOSEC OTC) 20 MG tablet Take 20 mg by mouth daily.   OVER THE COUNTER MEDICATION Take 1 tablet by mouth daily. Allergy Medicine   rosuvastatin (CRESTOR) 20 MG tablet TAKE 1 TABLET BY MOUTH EVERY DAY   spironolactone (ALDACTONE) 25 MG tablet Take 1 tablet by mouth once daily   telmisartan (MICARDIS) 40 MG tablet Take 1 tablet (40 mg total) by mouth daily.   [DISCONTINUED] triamcinolone acetonide (KENALOG) 10 MG/ML injection 10 mg  No facility-administered encounter medications on file as of 10/20/2021.    Past Medical History:  Diagnosis Date   Anginal pain ([place])    Aortic stenosis    BMI 40.0-44.9, adult [facility])    Coronary artery disease    Heart murmur    Hypertension    Knee pain 03/15/2018   S/P aortic valve replacement with bioprosthetic valve 08/24/2019   25 mm Edwards Inspiris Resilia stented bovine pericardial tissue valve   S/P CABG x 2 08/24/2019   SVG to OM, SVG to RCA, EVH via bilateral thighs   Swelling of lower leg     Past Surgical History:  Procedure Laterality Date   AORTIC VALVE REPLACEMENT N/A 08/24/2019   Procedure: AORTIC VALVE REPLACEMENT (AVR) USING INSPIRIS VALVE SIZE 25MM;  Surgeon: Rexene Alberts, MD;  Location: [facility];  Service: Open Heart Surgery;  Laterality: N/A;   CORONARY ARTERY BYPASS GRAFT N/A 08/24/2019   Procedure: CORONARY ARTERY BYPASS GRAFTING (CABG) x two, using bilateral leg greater saphenous veins harvested endoscopically;  Surgeon: Rexene Alberts, MD;  Location: [place];  Service: Open Heart Surgery;  Laterality: N/A;   FEMUR SURGERY     RIGHT/LEFT HEART CATH AND CORONARY ANGIOGRAPHY N/A 07/31/2019   Procedure: RIGHT/LEFT HEART CATH AND CORONARY ANGIOGRAPHY;  Surgeon: Sherren Mocha, MD;  Location: [place] [facility];  Service: Cardiovascular;  Laterality: N/A;   TEE WITHOUT CARDIOVERSION N/A 08/24/2019   Procedure: TRANSESOPHAGEAL ECHOCARDIOGRAM (TEE);  Surgeon: Rexene Alberts, MD;  Location: [facility];  Service: Open Heart Surgery;  Laterality: N/A;   WRIST SURGERY      Family History  Problem Relation Age of Onset   CAD Mother    Deep vein thrombosis Mother    Heart murmur Mother    Diabetes Mother    Multiple sclerosis Brother    Diabetes Cousin     Social History   Socioeconomic History   Marital status: Married    Spouse name: Tavion Senkbeil   Number of children: 2   Years of education: Not on file   Highest education level: Not on file  Occupational History   Not on file  Tobacco Use   Smoking status: Every Day    Packs/day: 1.00    Years: 28.00    Pack years: 28.00    Types: Cigarettes    Last attempt to quit: 08/21/2019    Years since quitting: 2.1   Smokeless tobacco: Never   Tobacco comments:    trying to quit  Vaping Use   Vaping Use: Never used  Substance and Sexual Activity   Alcohol use: Yes    Comment: occ beer    Drug use: Not Currently   Sexual activity: Yes    Partners: Female  Other Topics Concern   Not on file  Social History Narrative   Not on file   Social Determinants of Health   Financial Resource Strain: Low Risk    Difficulty of Paying Living Expenses: Not hard at all  Food Insecurity: No Food Insecurity    Worried About Charity fundraiser in the Last Year: Never true   [place] in the Last Year: Never true  Transportation Needs: No Transportation Needs   Lack of Transportation (Medical): No   Lack of Transportation (Non-Medical): No  Physical Activity: Inactive   Days of Exercise per Week: 0 days   Minutes of Exercise per Session: 0 min  Stress: No Stress Concern Present   Feeling  of Stress : Not at all  Social Connections: Moderately Isolated   Frequency of Communication with Friends and Family: More than three times a week   Frequency of Social Gatherings with Friends and Family: More than three times a week   Attends Religious Services: Never   Marine scientist or Organizations: No   Attends Music therapist: Never   Marital Status: Married  Human resources officer Violence: Not At Risk   Fear of Current or Ex-Partner: No   Emotionally Abused: No   Physically Abused: No   Sexually Abused: No    Review of Systems  Constitutional:  Negative for chills, fever and malaise/fatigue.  HENT:  Negative for ear pain, sinus pain and sore throat.   Respiratory:  Negative for cough and shortness of breath.   Cardiovascular:  Negative for chest pain.  Gastrointestinal:  Positive for constipation.  Musculoskeletal:  Negative for myalgias.  Neurological:  Negative for headaches.  Endo/Heme/Allergies:  Positive for environmental allergies.       Objective    BP (!) 148/94   Pulse 72   Temp (!) 97.2 F (36.2 C)   Ht '6\' 2"'  (1.88 m)   Wt (!) 338 lb (153.3 kg)   SpO2 97%   BMI 43.40 kg/m    Physical Exam Vitals reviewed.  Constitutional:      Appearance: He is obese.  HENT:     Head: Normocephalic.     Right Ear: Tympanic membrane normal.     Left Ear: Tympanic membrane normal.     Nose: Nose normal.     Mouth/Throat:     Mouth: Mucous membranes are moist.  Eyes:     Pupils: Pupils are equal, round, and reactive to light.  Cardiovascular:     Rate and  Rhythm: Normal rate and regular rhythm.     Pulses: Normal pulses.     Heart sounds: Murmur heard.  Pulmonary:     Effort: Pulmonary effort is normal.     Breath sounds: Normal breath sounds.  Abdominal:     General: Bowel sounds are normal.     Palpations: Abdomen is soft.  Musculoskeletal:        General: Swelling (right ankle) present.  Skin:    General: Skin is warm and dry.     Capillary Refill: Capillary refill takes less than 2 seconds.  Neurological:     General: No focal deficit present.     Mental Status: He is alert and oriented to person, place, and time.  Psychiatric:        Mood and Affect: Mood normal.        Behavior: Behavior normal.       Assessment & Plan:   1. Coronary artery disease involving native coronary artery of native heart without angina pectoris - CBC with Differential/Platelet; Future - Comprehensive metabolic panel; Future - Lipid panel; Future  2. Benign hypertension - Testosterone,Free and Total; Future - CBC with Differential/Platelet; Future - Comprehensive metabolic panel; Future - TSH; Future  3. History of aortic valve replacement  4. Mixed hyperlipidemia - Lipid panel; Future  5. Gastroesophageal reflux disease, unspecified whether esophagitis present - CBC with Differential/Platelet; Future - Comprehensive metabolic panel; Future - omeprazole (PRILOSEC) 20 MG capsule; Take 1 capsule (20 mg total) by mouth daily.  Dispense: 90 capsule; Refill: 3  6. Cutaneous lupus erythematosus - CBC with Differential/Platelet; Future - Comprehensive metabolic panel; Future - Lipid panel; Future - TSH; Future - Ambulatory referral to  Rheumatology  7. Encounter for screening colonoscopy - Ambulatory referral to Gastroenterology  8. Class 3 severe obesity due to excess calories with serious comorbidity and body mass index (BMI) of 40.0 to 44.9 in adult (HCC) - Testosterone,Free and Total; Future - CBC with Differential/Platelet;  Future - Comprehensive metabolic panel; Future - Lipid panel; Future - TSH; Future - Vitamin D, 25-hydroxy; Future  9. Seasonal allergic rhinitis due to pollen - fluticasone (FLONASE) 50 MCG/ACT nasal spray; Place 2 sprays into both nostrils daily.  Dispense: 16 g; Refill: 6  10. Encounter for screening for malignant neoplasm of prostate - PSA; Future  11. Vaccine refused by patient -pt declined Shingles, Tdap, and COVID-19 vaccines at this time  12. Cigarette nicotine dependence with other nicotine-induced disorder - CBC with Differential/Platelet; Future - Comprehensive metabolic panel; Future  13. Encounter to establish care   We will call you with lab results and referral to GI doctor and rheumatologist Recommend eye exam Continue medications Follow-up in 73-month    Follow-up: 348-month I, ShRip HarbourNP, have reviewed all documentation for this visit. The documentation on 10/20/21 for the exam, diagnosis, procedures, and orders are all accurate and complete.   Signed, Jerrell BelfastDNP 10/20/21 at 4:25 pm

## 2021-10-20 NOTE — Patient Instructions (Addendum)
We will call you with lab results and referral to GI doctor for colonoscopy and rheumatologist Recommend eye exam, consider shingles and tetanus shot Continue medications Follow-up in 8-months, pending labs    Preventive Care 67-55 Years Old, Male Preventive care refers to lifestyle choices and visits with your health care provider that can promote health and wellness. Preventive care visits are also called wellness exams. What can I expect for my preventive care visit? Counseling During your preventive care visit, your health care provider may ask about your: Medical history, including: Past medical problems. Family medical history. Current health, including: Emotional well-being. Home life and relationship well-being. Sexual activity. Lifestyle, including: Alcohol, nicotine or tobacco, and drug use. Access to firearms. Diet, exercise, and sleep habits. Safety issues such as seatbelt and bike helmet use. Sunscreen use. Work and work Astronomer. Physical exam Your health care provider will check your: Height and weight. These may be used to calculate your BMI (body mass index). BMI is a measurement that tells if you are at a healthy weight. Waist circumference. This measures the distance around your waistline. This measurement also tells if you are at a healthy weight and may help predict your risk of certain diseases, such as type 2 diabetes and high blood pressure. Heart rate and blood pressure. Body temperature. Skin for abnormal spots. What immunizations do I need?  Vaccines are usually given at various ages, according to a schedule. Your health care provider will recommend vaccines for you based on your age, medical history, and lifestyle or other factors, such as travel or where you work. What tests do I need? Screening Your health care provider may recommend screening tests for certain conditions. This may include: Lipid and cholesterol levels. Diabetes screening. This  is done by checking your blood sugar (glucose) after you have not eaten for a while (fasting). Hepatitis B test. Hepatitis C test. HIV (human immunodeficiency virus) test. STI (sexually transmitted infection) testing, if you are at risk. Lung cancer screening. Prostate cancer screening. Colorectal cancer screening. Talk with your health care provider about your test results, treatment options, and if necessary, the need for more tests. Follow these instructions at home: Eating and drinking  Eat a diet that includes fresh fruits and vegetables, whole grains, lean protein, and low-fat dairy products. Take vitamin and mineral supplements as recommended by your health care provider. Do not drink alcohol if your health care provider tells you not to drink. If you drink alcohol: Limit how much you have to 0-2 drinks a day. Know how much alcohol is in your drink. In the [place] one drink equals one 12 oz bottle of beer (355 mL), one 5 oz glass of wine (148 mL), or one 1 oz glass of hard liquor (44 mL). Lifestyle Brush your teeth every morning and night with fluoride toothpaste. Floss one time each day. Exercise for at least 30 minutes 5 or more days each week. Do not use any products that contain nicotine or tobacco. These products include cigarettes, chewing tobacco, and vaping devices, such as e-cigarettes. If you need help quitting, ask your health care provider. Do not use drugs. If you are sexually active, practice safe sex. Use a condom or other form of protection to prevent STIs. Take aspirin only as told by your health care provider. Make sure that you understand how much to take and what form to take. Work with your health care provider to find out whether it is safe and beneficial for you to take aspirin  daily. Find healthy ways to manage stress, such as: Meditation, yoga, or listening to music. Journaling. Talking to a trusted person. Spending time with friends and family. Minimize  exposure to UV radiation to reduce your risk of skin cancer. Safety Always wear your seat belt while driving or riding in a vehicle. Do not drive: If you have been drinking alcohol. Do not ride with someone who has been drinking. When you are tired or distracted. While texting. If you have been using any mind-altering substances or drugs. Wear a helmet and other protective equipment during sports activities. If you have firearms in your house, make sure you follow all gun safety procedures. What's next? Go to your health care provider once a year for an annual wellness visit. Ask your health care provider how often you should have your eyes and teeth checked. Stay up to date on all vaccines. This information is not intended to replace advice given to you by your health care provider. Make sure you discuss any questions you have with your health care provider. Document Revised: 10/29/2020 Document Reviewed: 10/29/2020 Elsevier Patient Education  2023 [employer]  Managing Your Hypertension Hypertension, also called high blood pressure, is when the force of the blood pressing against the walls of the arteries is too strong. Arteries are blood vessels that carry blood from your heart throughout your body. Hypertension forces the heart to work harder to pump blood and may cause the arteries to become narrow or stiff. Understanding blood pressure readings A blood pressure reading includes a higher number over a lower number: The first, or top, number is called the systolic pressure. It is a measure of the pressure in your arteries as your heart beats. The second, or bottom number, is called the diastolic pressure. It is a measure of the pressure in your arteries as the heart relaxes. For most people, a normal blood pressure is below 120/80. Your personal target blood pressure may vary depending on your medical conditions, your age, and other factors. Blood pressure is classified into four  stages. Based on your blood pressure reading, your health care provider may use the following stages to determine what type of treatment you need, if any. Systolic pressure and diastolic pressure are measured in a unit called millimeters of mercury (mmHg). Normal Systolic pressure: below 120. Diastolic pressure: below 80. Elevated Systolic pressure: 120-129. Diastolic pressure: below 80. Hypertension stage 1 Systolic pressure: 130-139. Diastolic pressure: 80-89. Hypertension stage 2 Systolic pressure: 140 or above. Diastolic pressure: 90 or above. How can this condition affect me? Managing your hypertension is very important. Over time, hypertension can damage the arteries and decrease blood flow to parts of the body, including the brain, heart, and kidneys. Having untreated or uncontrolled hypertension can lead to: A heart attack. A stroke. A weakened blood vessel (aneurysm). Heart failure. Kidney damage. Eye damage. Memory and concentration problems. Vascular dementia. What actions can I take to manage this condition? Hypertension can be managed by making lifestyle changes and possibly by taking medicines. Your health care provider will help you make a plan to bring your blood pressure within a normal range. You may be referred for counseling on a healthy diet and physical activity. Nutrition  Eat a diet that is high in fiber and potassium, and low in salt (sodium), added sugar, and fat. An example eating plan is called the DASH diet. DASH stands for Dietary Approaches to Stop Hypertension. To eat this way: Eat plenty of fresh fruits and vegetables. Try  to fill one-half of your plate at each meal with fruits and vegetables. Eat whole grains, such as whole-wheat pasta, brown rice, or whole-grain bread. Fill about one-fourth of your plate with whole grains. Eat low-fat dairy products. Avoid fatty cuts of meat, processed or cured meats, and poultry with skin. Fill about one-fourth of  your plate with lean proteins such as fish, chicken without skin, beans, eggs, and tofu. Avoid pre-made and processed foods. These tend to be higher in sodium, added sugar, and fat. Reduce your daily sodium intake. Many people with hypertension should eat less than 1,500 mg of sodium a day. Lifestyle  Work with your health care provider to maintain a healthy body weight or to lose weight. Ask what an ideal weight is for you. Get at least 30 minutes of exercise that causes your heart to beat faster (aerobic exercise) most days of the week. Activities may include walking, swimming, or biking. Include exercise to strengthen your muscles (resistance exercise), such as weight lifting, as part of your weekly exercise routine. Try to do these types of exercises for 30 minutes at least 3 days a week. Do not use any products that contain nicotine or tobacco. These products include cigarettes, chewing tobacco, and vaping devices, such as e-cigarettes. If you need help quitting, ask your health care provider. Control any long-term (chronic) conditions you have, such as high cholesterol or diabetes. Identify your sources of stress and find ways to manage stress. This may include meditation, deep breathing, or making time for fun activities. Alcohol use Do not drink alcohol if: Your health care provider tells you not to drink. You are pregnant, may be pregnant, or are planning to become pregnant. If you drink alcohol: Limit how much you have to: 0-1 drink a day for women. 0-2 drinks a day for men. Know how much alcohol is in your drink. In the [place] one drink equals one 12 oz bottle of beer (355 mL), one 5 oz glass of wine (148 mL), or one 1 oz glass of hard liquor (44 mL). Medicines Your health care provider may prescribe medicine if lifestyle changes are not enough to get your blood pressure under control and if: Your systolic blood pressure is 130 or higher. Your diastolic blood pressure is 80 or  higher. Take medicines only as told by your health care provider. Follow the directions carefully. Blood pressure medicines must be taken as told by your health care provider. The medicine does not work as well when you skip doses. Skipping doses also puts you at risk for problems. Monitoring Before you monitor your blood pressure: Do not smoke, drink caffeinated beverages, or exercise within 30 minutes before taking a measurement. Use the bathroom and empty your bladder (urinate). Sit quietly for at least 5 minutes before taking measurements. Monitor your blood pressure at home as told by your health care provider. To do this: Sit with your back straight and supported. Place your feet flat on the floor. Do not cross your legs. Support your arm on a flat surface, such as a table. Make sure your upper arm is at heart level. Each time you measure, take two or three readings one minute apart and record the results. You may also need to have your blood pressure checked regularly by your health care provider. General information Talk with your health care provider about your diet, exercise habits, and other lifestyle factors that may be contributing to hypertension. Review all the medicines you take with your health care  provider because there may be side effects or interactions. Keep all follow-up visits. Your health care provider can help you create and adjust your plan for managing your high blood pressure. Where to find more information National Heart, Lung, and Blood Institute: [employer].iswww.nhlbi.nih.gov American Heart Association: www.heart.org Contact a health care provider if: You think you are having a reaction to medicines you have taken. You have repeated (recurrent) headaches. You feel dizzy. You have swelling in your ankles. You have trouble with your vision. Get help right away if: You develop a severe headache or confusion. You have unusual weakness or numbness, or you feel faint. You  have severe pain in your chest or abdomen. You vomit repeatedly. You have trouble breathing. These symptoms may be an emergency. Get help right away. Call 911. Do not wait to see if the symptoms will go away. Do not drive yourself to the hospital. Summary Hypertension is when the force of blood pumping through your arteries is too strong. If this condition is not controlled, it may put you at risk for serious complications. Your personal target blood pressure may vary depending on your medical conditions, your age, and other factors. For most people, a normal blood pressure is less than 120/80. Hypertension is managed by lifestyle changes, medicines, or both. Lifestyle changes to help manage hypertension include losing weight, eating a healthy, low-sodium diet, exercising more, stopping smoking, and limiting alcohol. This information is not intended to replace advice given to you by your health care provider. Make sure you discuss any questions you have with your health care provider. Document Revised: 01/15/2021 Document Reviewed: 01/15/2021 Elsevier Patient Education  2023 Else[employer] Inc.    Testosterone Test Why am I having this test? Testosterone is a hormone made by the adrenal glands in the abdomen in both males and females. In males, it is also made by the testicles. Starting at puberty, testosterone stimulates the development of secondary sex characteristics in males. This includes a deeper voice, muscle and body hair growth, and penis enlargement. In females, testosterone is also produced in the ovaries. The male body converts testosterone into estradiol, the main male sex hormone. An abnormal level of testosterone can cause health problems in both males and females. You may have this test if your health care provider suspects that an abnormal testosterone level is causing or contributing to other health problems. In males, an abnormally low testosterone level can cause: Inability  to have children. Trouble getting or maintaining an erection. Delayed puberty. In females, an abnormally high testosterone level can cause: Infertility. Polycystic ovary syndrome. Development of masculine features. What is being tested? This test measures the amount of total testosterone in your blood. What kind of sample is taken?  A blood sample is required for this test. It is usually collected by inserting a needle into a blood vessel. The sample is most often collected in the morning because that is when testosterone is usually the highest. How do I prepare for this test? Follow instructions from your health care provider about changing or stopping your regular medicines. Tell a health care provider about: Any allergies you have. All medicines you are taking, including vitamins, herbs, eye drops, creams, and over-the-counter medicines. Any blood disorders you have. Any surgeries you have had. Any medical conditions you have. Whether you are pregnant or may be pregnant. How are the results reported? Your test results will be reported as a value that indicates how much testosterone is in your blood. This will be given  as nanograms of testosterone per deciliter of blood (ng/dL). Your health care provider will compare your test results to normal ranges that were established after testing a large group of people (reference ranges). Reference ranges may vary among labs and hospitals. For this test, common reference ranges for total testosterone are: Male: 7 months to 55 years old: less than 30 ng/dL. 70-59 years old: less than 300 ng/dL. 63-39 years old: 170-540 ng/dL. 52-36 years old: 250-910 ng/dL. 20 years and older: 280-1,080 ng/dL. Male: 7 months to 55 years old: less than 30 ng/dL. 68-15 years old: less than 40 ng/dL. 21-8 years old: less than 60 ng/dL. 20-76 years old: less than 70 ng/dL. 20 years and older: less than 70 ng/dL. What do the results mean? A result that is  within your reference range means that you have a normal amount of testosterone in your blood. In males: A high testosterone level may mean that you: Have certain types of tumors. Have an overactive thyroid gland (hyperthyroidism). Currently use anabolic steroids or used anabolic steroids in the past. Have an inherited disorder that affects the adrenal glands (congenital adrenal hyperplasia). Are starting puberty early (precocious puberty). A low testosterone level may mean that you: Have certain genetic diseases. Have had certain viral infections, such as mumps. Have a condition that affects the pituitary gland. Have injured your testicles. In females: A high testosterone level may mean that you have: Certain types of tumors, such as ovarian or adrenal gland tumors. An inherited disorder that affects certain cells in the adrenal glands (congenital adrenal hyperplasia). Polycystic ovary syndrome. A low testosterone level usually will not cause health problems. Talk with your health care provider about what your results mean. Questions to ask your health care provider Ask your health care provider, or the department that is doing the test: When will my results be ready? How will I get my results? What are my treatment options? What other tests do I need? What are my next steps? Summary Testosterone is a hormone made by the adrenal glands in the abdomen in both males and females. In males, it is also made by the testicles. Starting at puberty, testosterone stimulates the development of secondary sex characteristics in males. In females, testosterone is also produced in the ovaries. The male body converts testosterone into estradiol, the main male sex hormone. An abnormal level of testosterone can cause health issues in both males and females. You may have this test if your health care provider suspects that an abnormal testosterone level is causing or contributing to other health  problems. This information is not intended to replace advice given to you by your health care provider. Make sure you discuss any questions you have with your health care provider. Document Revised: 02/04/2020 Document Reviewed: 02/04/2020 Elsevier Patient Education  2023 [employer].

## 2021-10-21 ENCOUNTER — Other Ambulatory Visit: Payer: 59

## 2021-10-21 DIAGNOSIS — I2510 Atherosclerotic heart disease of native coronary artery without angina pectoris: Secondary | ICD-10-CM

## 2021-10-21 DIAGNOSIS — Z125 Encounter for screening for malignant neoplasm of prostate: Secondary | ICD-10-CM

## 2021-10-21 DIAGNOSIS — L932 Other local lupus erythematosus: Secondary | ICD-10-CM

## 2021-10-21 DIAGNOSIS — E782 Mixed hyperlipidemia: Secondary | ICD-10-CM

## 2021-10-21 DIAGNOSIS — F17218 Nicotine dependence, cigarettes, with other nicotine-induced disorders: Secondary | ICD-10-CM

## 2021-10-21 DIAGNOSIS — I10 Essential (primary) hypertension: Secondary | ICD-10-CM

## 2021-10-21 DIAGNOSIS — K219 Gastro-esophageal reflux disease without esophagitis: Secondary | ICD-10-CM

## 2021-10-22 LAB — CBC WITH DIFFERENTIAL/PLATELET
Basophils Absolute: 0.1 10*3/uL (ref 0.0–0.2)
Basos: 1 %
EOS (ABSOLUTE): 0.1 10*3/uL (ref 0.0–0.4)
Eos: 1 %
Hematocrit: 43.9 % (ref 37.5–51.0)
Hemoglobin: 14.7 g/dL (ref 13.0–17.7)
Immature Grans (Abs): 0 10*3/uL (ref 0.0–0.1)
Immature Granulocytes: 0 %
Lymphocytes Absolute: 1.4 10*3/uL (ref 0.7–3.1)
Lymphs: 20 %
MCH: 29.7 pg (ref 26.6–33.0)
MCHC: 33.5 g/dL (ref 31.5–35.7)
MCV: 89 fL (ref 79–97)
Monocytes Absolute: 0.6 10*3/uL (ref 0.1–0.9)
Monocytes: 8 %
Neutrophils Absolute: 4.9 10*3/uL (ref 1.4–7.0)
Neutrophils: 70 %
Platelets: 222 10*3/uL (ref 150–450)
RBC: 4.95 x10E6/uL (ref 4.14–5.80)
RDW: 12.1 % (ref 11.6–15.4)
WBC: 7.1 10*3/uL (ref 3.4–10.8)

## 2021-10-22 LAB — LIPID PANEL
Chol/HDL Ratio: 2.9 ratio (ref 0.0–5.0)
Cholesterol, Total: 107 mg/dL (ref 100–199)
HDL: 37 mg/dL — ABNORMAL LOW (ref >39)
LDL Chol Calc (NIH): 54 mg/dL (ref 0–99)
Triglycerides: 81 mg/dL (ref 0–149)
VLDL Cholesterol Cal: 16 mg/dL (ref 5–40)

## 2021-10-22 LAB — COMPREHENSIVE METABOLIC PANEL
ALT: 18 IU/L (ref 0–44)
AST: 14 IU/L (ref 0–40)
Albumin/Globulin Ratio: 1.3 (ref 1.2–2.2)
Albumin: 4.1 g/dL (ref 3.8–4.9)
Alkaline Phosphatase: 66 IU/L (ref 44–121)
BUN/Creatinine Ratio: 16 (ref 9–20)
BUN: 16 mg/dL (ref 6–24)
Bilirubin Total: 0.6 mg/dL (ref 0.0–1.2)
CO2: 19 mmol/L — ABNORMAL LOW (ref 20–29)
Calcium: 9.1 mg/dL (ref 8.7–10.2)
Chloride: 99 mmol/L (ref 96–106)
Creatinine, Ser: 1.02 mg/dL (ref 0.76–1.27)
Globulin, Total: 3.1 g/dL (ref 1.5–4.5)
Glucose: 135 mg/dL — ABNORMAL HIGH (ref 70–99)
Potassium: 4.7 mmol/L (ref 3.5–5.2)
Sodium: 133 mmol/L — ABNORMAL LOW (ref 134–144)
Total Protein: 7.2 g/dL (ref 6.0–8.5)
eGFR: 87 mL/min/{1.73_m2} (ref >59)

## 2021-10-22 LAB — VITAMIN D 25 HYDROXY (VIT D DEFICIENCY, FRACTURES): Vit D, 25-Hydroxy: 36.2 ng/mL (ref 30.0–100.0)

## 2021-10-22 LAB — PSA: Prostate Specific Ag, Serum: 0.2 ng/mL (ref 0.0–4.0)

## 2021-10-22 LAB — CARDIOVASCULAR RISK ASSESSMENT

## 2021-10-22 LAB — TSH: TSH: 1.38 u[IU]/mL (ref 0.450–4.500)

## 2021-10-22 LAB — TESTOSTERONE,FREE AND TOTAL
Testosterone, Free: 1.2 pg/mL — ABNORMAL LOW (ref 7.2–24.0)
Testosterone: 72 ng/dL — ABNORMAL LOW (ref 264–916)

## 2021-10-27 ENCOUNTER — Other Ambulatory Visit: Payer: Self-pay

## 2021-10-27 DIAGNOSIS — R7989 Other specified abnormal findings of blood chemistry: Secondary | ICD-10-CM

## 2021-10-28 ENCOUNTER — Other Ambulatory Visit: Payer: 59

## 2021-10-28 DIAGNOSIS — R7989 Other specified abnormal findings of blood chemistry: Secondary | ICD-10-CM

## 2021-11-01 LAB — TESTOSTERONE,FREE AND TOTAL
Testosterone, Free: 1.3 pg/mL — ABNORMAL LOW (ref 7.2–24.0)
Testosterone: 55 ng/dL — ABNORMAL LOW (ref 264–916)

## 2021-11-02 ENCOUNTER — Other Ambulatory Visit: Payer: Self-pay | Admitting: Nurse Practitioner

## 2021-11-02 ENCOUNTER — Telehealth: Payer: Self-pay

## 2021-11-02 ENCOUNTER — Ambulatory Visit: Payer: 59 | Admitting: Nurse Practitioner

## 2021-11-02 ENCOUNTER — Encounter: Payer: Self-pay | Admitting: Nurse Practitioner

## 2021-11-02 VITALS — BP 130/80 | HR 73 | Temp 97.3°F | Ht 74.0 in | Wt 340.0 lb

## 2021-11-02 DIAGNOSIS — R7989 Other specified abnormal findings of blood chemistry: Secondary | ICD-10-CM

## 2021-11-02 DIAGNOSIS — Z5320 Procedure and treatment not carried out because of patient's decision for unspecified reasons: Secondary | ICD-10-CM

## 2021-11-02 DIAGNOSIS — E291 Testicular hypofunction: Secondary | ICD-10-CM

## 2021-11-02 DIAGNOSIS — F17218 Nicotine dependence, cigarettes, with other nicotine-induced disorders: Secondary | ICD-10-CM | POA: Diagnosis not present

## 2021-11-02 MED ORDER — TESTOSTERONE CYPIONATE 100 MG/ML IM SOLN: 200.0000 mg | INTRAMUSCULAR | 2 refills | Status: DC

## 2021-11-02 NOTE — Patient Instructions (Signed)
Begin Testosterone 200 mg injection every 14 days Strongly recommend quitting smoking Follow-up in 67-months   Hypogonadism, Male  Male hypogonadism is a condition of having a level of testosterone that is lower than normal. Testosterone is a chemical, or hormone, that is made mainly in the testicles. In boys, testosterone is responsible for the development of male characteristics during puberty. These include: Making the penis bigger. Growing and building the muscles. Growing facial hair. Deepening the voice. In adult men, testosterone is responsible for maintaining: An interest in sex and the ability to have sex. Muscle mass. Sperm production. Red blood cell production. Bone strength. Testosterone also gives men energy and a sense of well-being. Testosterone normally decreases as men age and the testicles make less testosterone. Testosterone levels can vary from man to man. Not all men will have signs and symptoms of low testosterone. Weight, alcohol use, medicines, and certain medical conditions can affect a man's testosterone level. What are the causes? This condition is caused by: A natural decrease in testosterone that occurs as a man grows older. This is the main cause of this condition. Use of medicines, such as antidepressants, steroids, and opioids. Diseases and conditions that affect the testicles or the making of testosterone. These include: Injury or damage to the testicles from trauma, cancer, cancer treatment, or infection. Diabetes. Sleep apnea. Genetic conditions that men are born with. Disease of the pituitary gland. This gland is in the brain. It produces hormones. Obesity. Metabolic syndrome. This is a group of diseases that affect blood pressure, blood sugar, cholesterol, and belly fat. HIV or AIDS. Alcohol abuse. Kidney failure. Other long-term or chronic diseases. What are the signs or symptoms? Common symptoms of this condition include: Loss of interest  in sex (low sex drive). Inability to have or maintain an erection (erectile dysfunction). Feeling tired (fatigue). Mood changes, like irritability or depression. Loss of muscle and body hair. Infertility. Large breasts. Weight gain (obesity). How is this diagnosed? Your health care provider can diagnose hypogonadism based on: Your signs and symptoms. A physical exam to check your testosterone levels. This includes blood tests. Testosterone levels can change throughout the day. Levels are highest in the morning. You may need to have repeat blood tests before getting a diagnosis of hypogonadism. Depending on your medical history and test results, your health care provider may also do other tests to find the cause of low testosterone. How is this treated? This condition is treated with testosterone replacement therapy. Testosterone can be given by: Injection or through pellets inserted under the skin. Gels or patches placed on the skin or in the mouth. Testosterone therapy is not for everyone. It has risks and side effects. Your health care provider will consider your medical history, your risk for prostate cancer, your age, and your symptoms before putting you on testosterone replacement therapy. Follow these instructions at home: Take over-the-counter and prescription medicines only as told by your health care provider. Eat foods that are high in fiber, such as beans, whole grains, and fresh fruits and vegetables. Limit foods that are high in fat and processed sugars, such as fried or sweet foods. If you drink alcohol: Limit how much you have to 0-2 drinks a day. Know how much alcohol is in your drink. In the [place] one drink equals one 12 oz bottle of beer (355 mL), one 5 oz glass of wine (148 mL), or one 1 oz glass of hard liquor (44 mL). Return to your normal activities as told by  your health care provider. Ask your health care provider what activities are safe for you. Keep all follow-up  visits. This is important. Contact a health care provider if: You have any of the signs or symptoms of low testosterone. You have any side effects from testosterone therapy. Summary Male hypogonadism is a condition of having a level of testosterone that is lower than normal. The natural drop in testosterone production that occurs with age is the most common cause of this condition. Low testosterone can also be caused by many diseases and conditions that affect the testicles and the making of testosterone. This condition is treated with testosterone replacement therapy. There are risks and side effects of testosterone therapy. Your health care provider will consider your age, medical history, symptoms, and risks for prostate cancer before putting you on testosterone therapy. This information is not intended to replace advice given to you by your health care provider. Make sure you discuss any questions you have with your health care provider. Document Revised: 01/03/2020 Document Reviewed: 01/03/2020 Elsevier Patient Education  2023 Elsevier Inc.  Managing the Challenge of Quitting Smoking Quitting smoking is a physical and mental challenge. You may have cravings, withdrawal symptoms, and temptation to smoke. Before quitting, work with your health care provider to make a plan that can help you manage quitting. Making a plan before you quit may keep you from smoking when you have the urge to smoke while trying to quit. How to manage lifestyle changes Managing stress Stress can make you want to smoke, and wanting to smoke may cause stress. It is important to find ways to manage your stress. You could try some of the following: Practice relaxation techniques. Breathe slowly and deeply, in through your nose and out through your mouth. Listen to music. Soak in a bath or take a shower. Imagine a peaceful place or vacation. Get some support. Talk with family or friends about your stress. Join a  support group. Talk with a counselor or therapist. Get some physical activity. Go for a walk, run, or bike ride. Play a favorite sport. Practice yoga.  Medicines Talk with your health care provider about medicines that might help you deal with cravings and make quitting easier for you. Relationships Social situations can be difficult when you are quitting smoking. To manage this, you can: Avoid parties and other social situations where people might be smoking. Avoid alcohol. Leave right away if you have the urge to smoke. Explain to your family and friends that you are quitting smoking. Ask for support and let them know you might be a bit grumpy. Plan activities where smoking is not an option. General instructions Be aware that many people gain weight after they quit smoking. However, not everyone does. To keep from gaining weight, have a plan in place before you quit, and stick to the plan after you quit. Your plan should include: Eating healthy snacks. When you have a craving, it may help to: Eat popcorn, or try carrots, celery, or other cut vegetables. Chew sugar-free gum. Changing how you eat. Eat small portion sizes at meals. Eat 4-6 small meals throughout the day instead of 1-2 large meals a day. Be mindful when you eat. You should avoid watching television or doing other things that might distract you as you eat. Exercising regularly. Make time to exercise each day. If you do not have time for a long workout, do short bouts of exercise for 5-10 minutes several times a day. Do some form of  strengthening exercise, such as weight lifting. Do some exercise that gets your heart beating and causes you to breathe deeply, such as walking fast, running, swimming, or biking. This is very important. Drinking plenty of water or other low-calorie or no-calorie drinks. Drink enough fluid to keep your urine pale yellow.  How to recognize withdrawal symptoms Your body and mind may experience  discomfort as you try to get used to not having nicotine in your system. These effects are called withdrawal symptoms. They may include: Feeling hungrier than normal. Having trouble concentrating. Feeling irritable or restless. Having trouble sleeping. Feeling depressed. Craving a cigarette. These symptoms may surprise you, but they are normal to have when quitting smoking. To manage withdrawal symptoms: Avoid places, people, and activities that trigger your cravings. Remember why you want to quit. Get plenty of sleep. Avoid coffee and other drinks that contain caffeine. These may worsen some of your symptoms. How to manage cravings Come up with a plan for how to deal with your cravings. The plan should include the following: A definition of the specific situation you want to deal with. An activity or action you will take to replace smoking. A clear idea for how this action will help. The name of someone who could help you with this. Cravings usually last for 5-10 minutes. Consider taking the following actions to help you with your plan to deal with cravings: Keep your mouth busy. Chew sugar-free gum. Suck on hard candies or a straw. Brush your teeth. Keep your hands and body busy. Change to a different activity right away. Squeeze or play with a ball. Do an activity or a hobby, such as making bead jewelry, practicing needlepoint, or working with wood. Mix up your normal routine. Take a short exercise break. Go for a quick walk, or run up and down stairs. Focus on doing something kind or helpful for someone else. Call a friend or family member to talk during a craving. Join a support group. Contact a quitline. Where to find support To get help or find a support group: Call the National Cancer Institute's Smoking Quitline: 1-800-QUIT-NOW 367-071-9660) Text QUIT to SmokefreeTXT: 034917 Where to find more information Visit these websites to find more information on quitting  smoking: U.S. Department of Health and Human Services: www.smokefree.gov American Lung Association: www.freedomfromsmoking.org Centers for Disease Control and Prevention (CDC): FootballExhibition.com.br American Heart Association: www.heart.org Contact a health care provider if: You want to change your plan for quitting. The medicines you are taking are not helping. Your eating feels out of control or you cannot sleep. You feel depressed or become very anxious. Summary Quitting smoking is a physical and mental challenge. You will face cravings, withdrawal symptoms, and temptation to smoke again. Preparation can help you as you go through these challenges. Try different techniques to manage stress, handle social situations, and prevent weight gain. You can deal with cravings by keeping your mouth busy (such as by chewing gum), keeping your hands and body busy, calling family or friends, or contacting a quitline for people who want to quit smoking. You can deal with withdrawal symptoms by avoiding places where people smoke, getting plenty of rest, and avoiding drinks that contain caffeine. This information is not intended to replace advice given to you by your health care provider. Make sure you discuss any questions you have with your health care provider. Document Revised: 04/24/2021 Document Reviewed: 04/24/2021 Elsevier Patient Education  2023 [employer].

## 2021-11-02 NOTE — Progress Notes (Signed)
   Established Patient Office Visit  Subjective   Patient ID: Antonio Riley, male    DOB: May 02, 1966  Age: 56 y.o. MRN: [id number]  CC: Hypogonadism  HPI  Patient presents today to discuss testosterone levels as treatment options. He c/o symptoms of fatigue, weight gain, decreased libido, and ED. Testosterone levels were found to be low on 10/21/21 with lab values of 72 and free testosterone 1.2 and 10/28/21 testosterone 55 and free testosterone 1.3. He tells me that low testosterone levels have impacted his quality of life.   Review of Systems  Constitutional:  Positive for malaise/fatigue.  Respiratory:  Negative for cough.   Cardiovascular:  Negative for chest pain.  Genitourinary:        Decreased libido  Musculoskeletal:  Negative for back pain and neck pain.  Neurological:  Negative for weakness.     Objective:     BP 130/80   Pulse 73   Temp (!) 97.3 F (36.3 C)   Ht 6\' 2"  (1.88 m)   Wt (!) 340 lb (154.2 kg)   SpO2 99%   BMI 43.65 kg/m     Physical Exam Vitals reviewed.  Constitutional:      Appearance: He is obese.  Skin:    General: Skin is warm and dry.     Capillary Refill: Capillary refill takes less than 2 seconds.  Neurological:     General: No focal deficit present.     Mental Status: He is alert and oriented to person, place, and time.  Psychiatric:        Mood and Affect: Mood normal.        Behavior: Behavior normal.     Assessment & Plan:   1. Low testosterone level in male - testosterone cypionate (DEPO-TESTOSTERONE) 100 MG/ML injection; Inject 2 mLs (200 mg total) into the muscle every 14 (fourteen) days. For IM use only  Dispense: 10 mL; Refill: 2 -FSH/LH  In-depth discussion given on treatment of testosterone replacement therapy for hypogonadism. Risks include increased chance of cardiac event including MI or CVA, prostate cancer, elevated PSA levels, polycythemia, and hematoma at injection site. Options given of testosterone  injections, topical gel, or adhesive patch. Pt wished to precede with testosterone injections. States he "wants to feel like a man again".  2. Male hypogonadism - testosterone cypionate (DEPO-TESTOSTERONE) 100 MG/ML injection; Inject 2 mLs (200 mg total) into the muscle every 14 (fourteen) days. For IM use only  Dispense: 10 mL; Refill: 2  3. Colon cancer screening declined  4. Cigarette nicotine dependence with other nicotine-induced disorder -strongly recommend smoking cessation   Begin Testosterone 200 mg injection every 14 days Strongly recommend quitting smoking Follow-up in 90-months   Follow-up: 80-months  I, 2-month, NP, have reviewed all documentation for this visit. The documentation on 11/02/21 for the exam, diagnosis, procedures, and orders are all accurate and complete.   Signed, 11/04/21, NP

## 2021-11-02 NOTE — Telephone Encounter (Signed)
Patient requested to resend testosterone prescription to [facility] in [place] because [facility] in [place] do not have in stock. Please advice.

## 2021-11-03 LAB — SPECIMEN STATUS REPORT

## 2021-11-03 LAB — FSH/LH
FSH: 6.6 m[IU]/mL (ref 1.5–12.4)
LH: 3.9 m[IU]/mL (ref 1.7–8.6)

## 2021-11-03 NOTE — Telephone Encounter (Signed)
I called patient to inform that testosterone was sent.

## 2021-11-18 LAB — SPECIMEN STATUS REPORT

## 2021-11-18 LAB — HGB A1C W/O EAG: Hgb A1c MFr Bld: 6.2 % — ABNORMAL HIGH (ref 4.8–5.6)

## 2021-11-26 ENCOUNTER — Encounter: Payer: Self-pay | Admitting: Cardiology

## 2021-11-26 ENCOUNTER — Ambulatory Visit (INDEPENDENT_AMBULATORY_CARE_PROVIDER_SITE_OTHER): Payer: 59 | Admitting: Cardiology

## 2021-11-26 VITALS — BP 126/92 | HR 62 | Ht 74.0 in | Wt 342.0 lb

## 2021-11-26 DIAGNOSIS — I2510 Atherosclerotic heart disease of native coronary artery without angina pectoris: Secondary | ICD-10-CM

## 2021-11-26 DIAGNOSIS — I1A0 Resistant hypertension: Secondary | ICD-10-CM

## 2021-11-26 DIAGNOSIS — Z951 Presence of aortocoronary bypass graft: Secondary | ICD-10-CM

## 2021-11-26 DIAGNOSIS — E7841 Elevated Lipoprotein(a): Secondary | ICD-10-CM

## 2021-11-26 DIAGNOSIS — E7800 Pure hypercholesterolemia, unspecified: Secondary | ICD-10-CM

## 2021-11-26 DIAGNOSIS — I10 Essential (primary) hypertension: Secondary | ICD-10-CM

## 2021-11-26 DIAGNOSIS — Z953 Presence of xenogenic heart valve: Secondary | ICD-10-CM

## 2021-11-26 NOTE — Progress Notes (Signed)
Cardiology Office Note:    Date:  11/26/2021   ID:  Antonio Riley, DOB 02-02-1966, MRN [id number]  PCP:  Rip Harbour, NP  Cardiologist:  Shirlee More, MD    Referring MD: Rip Harbour, NP    ASSESSMENT:    1. S/P aortic valve replacement with bioprosthetic valve   2. Coronary artery disease involving native coronary artery of native heart without angina pectoris   3. S/P CABG x 2   4. Resistant hypertension   5. Pure hypercholesterolemia   6. Elevated lipoprotein A level    PLAN:    In order of problems listed above:  He continues to do well following his aortic valve replacement normal examination no indication of valve dysfunction echocardiogram 1 year ago was normal.  His ascending aorta was very mildly enlarged we will need to consider repeat imaging in about a year Stable CAD having no angina continue current treatment including beta-blocker aspirin statin Much improved blood pressure control with a combination of multiple agents including loop diuretic MRA high potency ARB and beta-blocker continue the same LDL at target he does have LP(a) excess, and next generation agents are available clinically would benefit.  Today his LP(a) is not high enough to enter the [place] trial.   Next appointment: 1 year   Medication Adjustments/Labs and Tests Ordered: Current medicines are reviewed at length with the patient today.  Concerns regarding medicines are outlined above.  No orders of the defined types were placed in this encounter.  No orders of the defined types were placed in this encounter.   Chief Complaint  Patient presents with   Follow-up    For his aortic valve disease   Hypertension    History of Present Illness:    Antonio Riley is a 56 y.o. male with a hx of severe symptomatic aortic stenosis with aortic valve replacement with a stented bovine pericardial tissue valve and coronary artery bypass surgery 08/24/2019, he had postoperative  atrial fibrillation resistant hypertension hyperlipidemia and elevated lipoprotein LPa   last seen 01/29/2021.  Compliance with diet, lifestyle and medications: Yes  He trends his home blood pressures he tends to run in the range of 120-125/90 or less. He is feeling improved since he started testosterone supplementation. His blood pressure is well well controlled he tolerates his statin without muscle pain or weakness in the leg recent labs were quite reassuring LDL 54 A1c 6.2 hemoglobin 14.7 creatinine 1.0 He has had no edema shortness of breath orthopnea chest pain palpitations syncope fever chills Past Medical History:  Diagnosis Date   Anginal pain ([place])    Aortic stenosis    BMI 40.0-44.9, adult (HCC)    Coronary artery disease    Cutaneous lupus erythematosus    Heart murmur    Hypertension    Knee pain 03/15/2018   S/P aortic valve replacement with bioprosthetic valve 08/24/2019   25 mm Edwards Inspiris Resilia stented bovine pericardial tissue valve   S/P CABG x 2 08/24/2019   SVG to OM, SVG to RCA, EVH via bilateral thighs   Swelling of lower leg     Past Surgical History:  Procedure Laterality Date   AORTIC VALVE REPLACEMENT N/A 08/24/2019   Procedure: AORTIC VALVE REPLACEMENT (AVR) USING INSPIRIS VALVE SIZE 25MM;  Surgeon: Rexene Alberts, MD;  Location: [place];  Service: Open Heart Surgery;  Laterality: N/A;   CORONARY ARTERY BYPASS GRAFT N/A 08/24/2019   Procedure: CORONARY ARTERY BYPASS GRAFTING (CABG) x two,  using bilateral leg greater saphenous veins harvested endoscopically;  Surgeon: Purcell Nails, MD;  Location: [facility] OR;  Service: Open Heart Surgery;  Laterality: N/A;   FEMUR SURGERY     RIGHT/LEFT HEART CATH AND CORONARY ANGIOGRAPHY N/A 07/31/2019   Procedure: RIGHT/LEFT HEART CATH AND CORONARY ANGIOGRAPHY;  Surgeon: Tonny Bollman, MD;  Location: [facility] INVASIVE CV LAB;  Service: Cardiovascular;  Laterality: N/A;   TEE WITHOUT CARDIOVERSION N/A 08/24/2019   Procedure:  TRANSESOPHAGEAL ECHOCARDIOGRAM (TEE);  Surgeon: Purcell Nails, MD;  Location: [facility] OR;  Service: Open Heart Surgery;  Laterality: N/A;   WRIST SURGERY      Current Medications: Current Meds  Medication Sig   ASPIRIN LOW DOSE 81 MG EC tablet TAKE 1 TABLET (81 MG TOTAL) BY MOUTH DAILY. SWALLOW WHOLE.   Calcium Carbonate-Vitamin D (CALCIUM 600/VITAMIN D PO) Take 1 tablet by mouth daily.   carvedilol (COREG) 12.5 MG tablet Take 1 tablet (12.5 mg total) by mouth 2 (two) times daily.   fluticasone (FLONASE) 50 MCG/ACT nasal spray Place 2 sprays into both nostrils daily.   furosemide (LASIX) 20 MG tablet Take 1 tablet (20 mg total) by mouth 3 (three) times a week.   omeprazole (PRILOSEC) 20 MG capsule Take 1 capsule (20 mg total) by mouth daily.   OVER THE COUNTER MEDICATION Take 1 tablet by mouth daily. Allergy Medicine   rosuvastatin (CRESTOR) 20 MG tablet Take 1 tablet (20 mg total) by mouth daily.   spironolactone (ALDACTONE) 25 MG tablet Take 1 tablet (25 mg total) by mouth daily.   telmisartan (MICARDIS) 40 MG tablet Take 1 tablet (40 mg total) by mouth daily.   testosterone cypionate (DEPO-TESTOSTERONE) 100 MG/ML injection Inject 2 mLs (200 mg total) into the muscle every 14 (fourteen) days. For IM use only     Allergies:   Coricidin hbp cough-cold [chlorpheniramine-dm]   Social History   Socioeconomic History   Marital status: Married    Spouse name: Bijan Ridgley   Number of children: 2   Years of education: Not on file   Highest education level: Not on file  Occupational History   Not on file  Tobacco Use   Smoking status: Every Day    Packs/day: 1.00    Years: 28.00    Total pack years: 28.00    Types: Cigarettes    Last attempt to quit: 08/21/2019    Years since quitting: 2.2   Smokeless tobacco: Never   Tobacco comments:    trying to quit  Vaping Use   Vaping Use: Never used  Substance and Sexual Activity   Alcohol use: Yes    Comment: occ beer    Drug use: Not  Currently   Sexual activity: Yes    Partners: Female  Other Topics Concern   Not on file  Social History Narrative   Not on file   Social Determinants of Health   Financial Resource Strain: Low Risk  (10/20/2021)   Overall Financial Resource Strain (CARDIA)    Difficulty of Paying Living Expenses: Not hard at all  Food Insecurity: No Food Insecurity (10/20/2021)   Hunger Vital Sign    Worried About Running Out of Food in the Last Year: Never true    Ran Out of Food in the Last Year: Never true  Transportation Needs: No Transportation Needs (10/20/2021)   PRAPARE - Administrator, Civil Service (Medical): No    Lack of Transportation (Non-Medical): No  Physical Activity: Inactive (10/20/2021)  Exercise Vital Sign    Days of Exercise per Week: 0 days    Minutes of Exercise per Session: 0 min  Stress: No Stress Concern Present (10/20/2021)   [employer] of Occupational Health - Occupational Stress Questionnaire    Feeling of Stress : Not at all  Social Connections: Moderately Isolated (10/20/2021)   Social Connection and Isolation Panel [NHANES]    Frequency of Communication with Friends and Family: More than three times a week    Frequency of Social Gatherings with Friends and Family: More than three times a week    Attends Religious Services: Never    Database administrator or Organizations: No    Attends Engineer, structural: Never    Marital Status: Married     Family History: The patient's family history includes CAD in his mother; Deep vein thrombosis in his mother; Diabetes in his cousin and mother; Heart murmur in his mother; Multiple sclerosis in his brother. ROS:   Please see the history of present illness.    All other systems reviewed and are negative.  EKGs/Labs/Other Studies Reviewed:    The following studies were reviewed today: 10/24/2020:  1. Left ventricular ejection fraction, by estimation, is 60 to 65%. The  left ventricle has normal  function. The left ventricle has no regional  wall motion abnormalities. There is severe concentric left ventricular  hypertrophy. Left ventricular diastolic   parameters are consistent with Grade II diastolic dysfunction  (pseudonormalization).   2. Right ventricular systolic function is normal. The right ventricular  size is normal. There is normal pulmonary artery systolic pressure.   3. Left atrial size was moderately dilated.   4. The mitral valve is normal in structure. Trivial mitral valve  regurgitation. No evidence of mitral stenosis.   5. Well seated Inspiris 25 mm bioprosthetic valve with no evidence of  stenosis. Aortic valve regurgitation is not visualized.   6. Aneurysm of the ascending aorta, measuring 41 mm.   7. The inferior vena cava is normal in size with greater than 50%  respiratory variability, suggesting right atrial pressure of 3 mmHg    Recent Labs: 10/21/2021: ALT 18; BUN 16; Creatinine, Ser 1.02; Hemoglobin 14.7; Platelets 222; Potassium 4.7; Sodium 133; TSH 1.380  Recent Lipid Panel    Component Value Date/Time   CHOL 107 10/21/2021 1629   TRIG 81 10/21/2021 1629   HDL 37 (L) 10/21/2021 1629   CHOLHDL 2.9 10/21/2021 1629   LDLCALC 54 10/21/2021 1629    Physical Exam:    VS:  BP (!) 126/92 (BP Location: Left Arm, Patient Position: Sitting, Cuff Size: Normal)   Pulse 62   Ht 6\' 2"  (1.88 m)   Wt (!) 342 lb (155.1 kg)   SpO2 94%   BMI 43.91 kg/m     Wt Readings from Last 3 Encounters:  11/26/21 (!) 342 lb (155.1 kg)  11/02/21 (!) 340 lb (154.2 kg)  10/20/21 (!) 338 lb (153.3 kg)     GEN: Obese  Well nourished, well developed in no acute distress HEENT: Normal NECK: No JVD; No carotid bruits LYMPHATICS: No lymphadenopathy CARDIAC: RRR, no murmurs, rubs, gallops RESPIRATORY:  Clear to auscultation without rales, wheezing or rhonchi  ABDOMEN: Soft, non-tender, non-distended MUSCULOSKELETAL:  No edema; No deformity  SKIN: Warm and  dry NEUROLOGIC:  Alert and oriented x 3 PSYCHIATRIC:  Normal affect    Signed, 12/20/21, MD  11/26/2021 8:46 AM    [facility]

## 2021-11-26 NOTE — Patient Instructions (Signed)

## 2021-12-13 ENCOUNTER — Other Ambulatory Visit: Payer: Self-pay | Admitting: Cardiology

## 2022-01-20 NOTE — Progress Notes (Signed)
Subjective:  Patient ID: Antonio Riley, male    DOB: 05/02/1966  Age: 56 y.o. MRN: [id number]  Chief Complaint  Patient presents with   Hyperlipidemia    HPI  Pt presents for follow-up of prediabetes, GERD, and low testosterone. States he is concerned with weight gain. Current weight 345 lb, BMI 45.52. Pt has cardiac history with aortic valve replacement and CABG 08/24/19. He is followed by Dr Dulce Sellar, cardiologist. Last visit  11/26/21, no changes to medications.   Prediabetes, Follow-up  Lab Results  Component Value Date   HGBA1C 6.2 (H) 10/21/2021   HGBA1C 6.0 (H) 08/22/2019   GLUCOSE 135 (H) 10/21/2021   GLUCOSE 108 (H) 02/19/2021   GLUCOSE 90 01/29/2021    Last seen for for this3 months ago.  Management since that visit includes diet modifications. Pt declined Metformin. Current symptoms include none and have been stable.  Prior visit with dietician: no Current diet: in general, a "healthy" diet   Current exercise: yard work  Pertinent Labs:    Component Value Date/Time   CHOL 107 10/21/2021 1629   TRIG 81 10/21/2021 1629   CHOLHDL 2.9 10/21/2021 1629   CREATININE 1.02 10/21/2021 1629    Wt Readings from Last 3 Encounters:  01/21/22 (!) 345 lb (156.5 kg)  11/26/21 (!) 342 lb (155.1 kg)  11/02/21 (!) 340 lb (154.2 kg)     GERD, Follow up:  The patient was last seen for GERD 3 months ago. Current treatment consist of: Omeprazole He reports excellent compliance with treatment. He is not having side effects.  He is NOT experiencing difficulty swallowing.  Lipid/Cholesterol, Follow-up  Last lipid panel Other pertinent labs  Lab Results  Component Value Date   CHOL 107 10/21/2021   HDL 37 (L) 10/21/2021   LDLCALC 54 10/21/2021   TRIG 81 10/21/2021   CHOLHDL 2.9 10/21/2021   Lab Results  Component Value Date   ALT 18 10/21/2021   AST 14 10/21/2021   PLT 222 10/21/2021   TSH 1.380 10/21/2021     Management includes Crestor. He reports  excellent compliance with treatment. He is not having side effects.  Current diet: not asked Current exercise: none  Male hypogonadism:   CAD: Management includes coreg, lasix Saw cardiologist last 01/2021 He reports excellent compliance with treatment. He is not having side effects.  He is following a Regular diet. Use of agents associated with hypertension: NSAIDS.  Current Outpatient Medications on File Prior to Visit  Medication Sig Dispense Refill   aspirin EC (EQ ASPIRIN ADULT LOW DOSE) 81 MG tablet Take 1 tablet (81 mg total) by mouth daily. 90 tablet 2   Calcium Carbonate-Vitamin D (CALCIUM 600/VITAMIN D PO) Take 1 tablet by mouth daily.     carvedilol (COREG) 12.5 MG tablet Take 1 tablet (12.5 mg total) by mouth 2 (two) times daily. 180 tablet 2   fluticasone (FLONASE) 50 MCG/ACT nasal spray Place 2 sprays into both nostrils daily. 16 g 6   furosemide (LASIX) 20 MG tablet Take 1 tablet (20 mg total) by mouth 3 (three) times a week. 41 tablet 3   omeprazole (PRILOSEC) 20 MG capsule Take 1 capsule (20 mg total) by mouth daily. 90 capsule 3   OVER THE COUNTER MEDICATION Take 1 tablet by mouth daily. Allergy Medicine     rosuvastatin (CRESTOR) 20 MG tablet Take 1 tablet (20 mg total) by mouth daily. 90 tablet 3   spironolactone (ALDACTONE) 25 MG tablet Take 1 tablet (25 mg  total) by mouth daily. 90 tablet 1   telmisartan (MICARDIS) 40 MG tablet Take 1 tablet (40 mg total) by mouth daily. 90 tablet 3   testosterone cypionate (DEPO-TESTOSTERONE) 100 MG/ML injection Inject 2 mLs (200 mg total) into the muscle every 14 (fourteen) days. For IM use only 10 mL 2   No current facility-administered medications on file prior to visit.   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   SVG to OM, SVG to RCA, EVH via bilateral thighs   Swelling of lower leg    Past Surgical History:  Procedure Laterality Date   AORTIC VALVE REPLACEMENT N/A 08/24/2019   Procedure: AORTIC VALVE REPLACEMENT (AVR) USING INSPIRIS VALVE SIZE ;  Surgeon: Purcell Nails, MD;  Location: [facility] OR;  Service: Open Heart Surgery;  Laterality: N/A;   CORONARY ARTERY BYPASS GRAFT N/A 08/24/2019   Procedure: CORONARY ARTERY BYPASS GRAFTING (CABG) x two, using bilateral leg greater saphenous veins harvested endoscopically;  Surgeon: Purcell Nails, MD;  Location: [facility] OR;  Service: Open Heart Surgery;  Laterality: N/A;   FEMUR SURGERY     RIGHT/LEFT HEART CATH AND CORONARY ANGIOGRAPHY N/A 07/31/2019   Procedure: RIGHT/LEFT HEART CATH AND CORONARY ANGIOGRAPHY;  Surgeon: Tonny Bollman, MD;  Location[facility] INVASIVE CV LAB;  Service: Cardiovascular;  Laterality: N/A;   TEE WITHOUT CARDIOVERSION N/A 08/24/2019   Procedure: TRANSESOPHAGEAL ECHOCARDIOGRAM (TEE);  Surgeon: Purcell Nails, MD;  Location: [facility] OR;  Service: Open Heart Surgery;  Laterality: N/A;   WRIST SURGERY      Family History  Problem Relation Age of Onset   CAD Mother    Deep vein thrombosis Mother    Heart murmur Mother    Diabetes Mother    Multiple sclerosis Brother    Diabetes Cousin    Social History   Socioeconomic History   Marital status: Married    Spouse name: Morrie Sheldon Haefner   Number of children: 2   Years of education: Not on file   Highest education level: Not on file  Occupational History   Not on file  Tobacco Use   Smoking status: Every Day    Packs/day: 1.00    Years: 28.00    Total pack years: 28.00    Types: Cigarettes    Last attempt to quit: 08/21/2019    Years since quitting: 2.4   Smokeless tobacco: Never   Tobacco comments:    trying to quit  Vaping Use    Vaping Use: Never used  Substance and Sexual Activity   Alcohol use: Yes    Comment: occ beer    Drug use: Not Currently   Sexual activity: Yes    Partners: Female  Other Topics Concern   Not on file  Social History Narrative   Not on file   Social Determinants of Health   Financial Resource Strain: Low Risk  (10/20/2021)   Overall Financial Resource Strain (CARDIA)    Difficulty of Paying Living Expenses: Not hard at all  Food Insecurity: No Food Insecurity (10/20/2021)   Hunger Vital Sign  Worried About Programme researcher, broadcasting/film/videounning Out of Food in the Last Year: Never true    Ran Out of Food in the Last Year: Never true  Transportation Needs: No Transportation Needs (10/20/2021)   PRAPARE - Administrator, Civil ServiceTransportation    Lack of Transportation (Medical): No    Lack of Transportation (Non-Medical): No  Physical Activity: Inactive (10/20/2021)   Exercise Vital Sign    Days of Exercise per Week: 0 days    Minutes of Exercise per Session: 0 min  Stress: No Stress Concern Present (10/20/2021)   [employer] - Occupational Stress Questionnaire    Feeling of Stress : Not at all  Social Connections: Moderately Isolated (10/20/2021)   Social Connection and Isolation Panel [NHANES]    Frequency of Communication with Friends and Family: More than three times a week    Frequency of Social Gatherings with Friends and Family: More than three times a week    Attends Religious Services: Never    Database administratorActive Member of Clubs or Organizations: No    Attends BankerClub or Organization Meetings: Never    Marital Status: Married    Review of Systems  Constitutional:  Positive for unexpected weight change (weight gain). Negative for chills, diaphoresis, fatigue and fever.  HENT:  Negative for congestion, ear pain and sore throat.   Respiratory:  Negative for cough and shortness of breath.   Cardiovascular:  Negative for chest pain and leg swelling.  Gastrointestinal:  Negative for abdominal pain, constipation,  diarrhea, nausea and vomiting.  Genitourinary:  Negative for dysuria and urgency.  Musculoskeletal:  Negative for arthralgias and myalgias.  Neurological:  Negative for dizziness and headaches.  Psychiatric/Behavioral:  Negative for dysphoric mood.      Objective:  BP 124/82 (BP Location: Left Arm, Patient Position: Sitting, Cuff Size: Normal)   Pulse 81   Temp (!) 97.3 F (36.3 C) (Temporal)   Ht 6\' 1"  (1.854 m)   Wt (!) 345 lb (156.5 kg)   SpO2 95%   BMI 45.52 kg/m      01/21/2022    7:36 AM 11/26/2021    8:24 AM 11/02/2021    8:08 AM  BP/Weight  Systolic BP 124 126 130  Diastolic BP 82 92 80  Wt. (Lbs) 345 342 340  BMI 45.52 kg/m2 43.91 kg/m2 43.65 kg/m2    Physical Exam Vitals reviewed.  Constitutional:      Appearance: He is obese.  HENT:     Right Ear: Tympanic membrane normal.     Left Ear: Tympanic membrane normal.     Nose: Nose normal.     Mouth/Throat:     Mouth: Mucous membranes are moist.  Eyes:     Pupils: Pupils are equal, round, and reactive to light.  Cardiovascular:     Rate and Rhythm: Normal rate and regular rhythm.     Heart sounds: Murmur heard.  Pulmonary:     Effort: Pulmonary effort is normal.     Breath sounds: Normal breath sounds.  Abdominal:     General: Bowel sounds are normal.     Palpations: Abdomen is soft.     Tenderness: There is no abdominal tenderness.  Skin:    General: Skin is warm and dry.     Capillary Refill: Capillary refill takes less than 2 seconds.  Neurological:     General: No focal deficit present.     Mental Status: He is alert and oriented to person, place, and time.  Psychiatric:  Mood and Affect: Mood normal.        Behavior: Behavior normal.    Lab Results  Component Value Date   WBC 7.1 10/21/2021   HGB 14.7 10/21/2021   HCT 43.9 10/21/2021   PLT 222 10/21/2021   GLUCOSE 135 (H) 10/21/2021   CHOL 107 10/21/2021   TRIG 81 10/21/2021   HDL 37 (L) 10/21/2021   LDLCALC 54 10/21/2021   ALT  18 10/21/2021   AST 14 10/21/2021   NA 133 (L) 10/21/2021   K 4.7 10/21/2021   CL 99 10/21/2021   CREATININE 1.02 10/21/2021   BUN 16 10/21/2021   CO2 19 (L) 10/21/2021   TSH 1.380 10/21/2021   INR 1.8 (H) 08/24/2019   HGBA1C 6.2 (H) 10/21/2021      Assessment & Plan:   1. Prediabetes-not at goal - CBC with Differential/Platelet - Comprehensive metabolic panel - Hemoglobin A1c - Semaglutide-Weight Management 0.25 MG/0.5ML SOAJ; Inject 0.25 mg into the skin once a week for 28 days.  Dispense: 2 mL; Refill: 0 - Semaglutide-Weight Management 0.5 MG/0.5ML SOAJ; Inject 0.5 mg into the skin once a week for 28 days.  Dispense: 2 mL; Refill: 0 - Semaglutide-Weight Management 1 MG/0.5ML SOAJ; Inject 1 mg into the skin once a week for 28 days.  Dispense: 2 mL; Refill: 0 - Semaglutide-Weight Management 1.7 MG/0.75ML SOAJ; Inject 1.7 mg into the skin once a week for 28 days.  Dispense: 3 mL; Refill: 0 - Semaglutide-Weight Management 2.4 MG/0.75ML SOAJ; Inject 2.4 mg into the skin once a week for 28 days.  Dispense: 3 mL; Refill: 0  2. Benign hypertension-well controlled - CBC with Differential/Platelet - Comprehensive metabolic panel -continue Coreg 20 mg QD -heart healthy diet  3. Mixed hyperlipidemia-well controlled - CBC with Differential/Platelet - Lipid panel -continue Crestor 20 mg QD  4. Coronary artery disease involving native coronary artery of native heart without angina pectoris-stable - CBC with Differential/Platelet - Comprehensive metabolic panel - Lipid panel -follow-up with cardiology as scheduled  5. Cigarette nicotine dependence with other nicotine-induced disorder - CBC with Differential/Platelet - Comprehensive metabolic panel - Lipid panel -recommend smoking cessation  6. Gastroesophageal reflux disease, unspecified whether esophagitis present-well controlled - CBC with Differential/Platelet - Comprehensive metabolic panel -continue Omeprazole 20 mg  QD -avoid foods that trigger GERD  7. Male hypogonadism-not at goal - Testosterone,Free and Total -continue testosterone as prescribed  8. Class 3 severe obesity due to excess calories with serious comorbidity and body mass index (BMI) of 45.0 to 49.9 in adult (HCC) - CBC with Differential/Platelet - Comprehensive metabolic panel - Lipid panel - Hemoglobin A1c - Semaglutide-Weight Management 0.25 MG/0.5ML SOAJ; Inject 0.25 mg into the skin once a week for 28 days.  Dispense: 2 mL; Refill: 0 - Semaglutide-Weight Management 0.5 MG/0.5ML SOAJ; Inject 0.5 mg into the skin once a week for 28 days.  Dispense: 2 mL; Refill: 0 - Semaglutide-Weight Management 1 MG/0.5ML SOAJ; Inject 1 mg into the skin once a week for 28 days.  Dispense: 2 mL; Refill: 0 - Semaglutide-Weight Management 1.7 MG/0.75ML SOAJ; Inject 1.7 mg into the skin once a week for 28 days.  Dispense: 3 mL; Refill: 0 - Semaglutide-Weight Management 2.4 MG/0.75ML SOAJ; Inject 2.4 mg into the skin once a week for 28 days.  Dispense: 3 mL; Refill: 0  9. Nausea - ondansetron (ZOFRAN) 4 MG tablet; Take 1 tablet (4 mg total) by mouth every 8 (eight) hours as needed for nausea or vomiting.  Dispense: 20  tablet; Refill: 0      Follow-up: 67-months, fasting  An After Visit Summary was printed and given to the patient.  I, Janie Morning, NP, have reviewed all documentation for this visit. The documentation on 01/21/22 for the exam, diagnosis, procedures, and orders are all accurate and complete.   Signed, Janie Morning, NP [facility] [phone]

## 2022-01-21 ENCOUNTER — Ambulatory Visit: Payer: 59 | Admitting: Nurse Practitioner

## 2022-01-21 ENCOUNTER — Encounter: Payer: Self-pay | Admitting: Nurse Practitioner

## 2022-01-21 VITALS — BP 124/82 | HR 81 | Temp 97.3°F | Ht 73.0 in | Wt 345.0 lb

## 2022-01-21 DIAGNOSIS — E782 Mixed hyperlipidemia: Secondary | ICD-10-CM

## 2022-01-21 DIAGNOSIS — R7303 Prediabetes: Secondary | ICD-10-CM

## 2022-01-21 DIAGNOSIS — I2510 Atherosclerotic heart disease of native coronary artery without angina pectoris: Secondary | ICD-10-CM | POA: Diagnosis not present

## 2022-01-21 DIAGNOSIS — K219 Gastro-esophageal reflux disease without esophagitis: Secondary | ICD-10-CM

## 2022-01-21 DIAGNOSIS — F17218 Nicotine dependence, cigarettes, with other nicotine-induced disorders: Secondary | ICD-10-CM

## 2022-01-21 DIAGNOSIS — R110 Nausea: Secondary | ICD-10-CM

## 2022-01-21 DIAGNOSIS — I10 Essential (primary) hypertension: Secondary | ICD-10-CM

## 2022-01-21 DIAGNOSIS — E291 Testicular hypofunction: Secondary | ICD-10-CM

## 2022-01-21 DIAGNOSIS — Z6841 Body Mass Index (BMI) 40.0 and over, adult: Secondary | ICD-10-CM

## 2022-01-21 MED ORDER — SEMAGLUTIDE-WEIGHT MANAGEMENT 1 MG/0.5ML ~~LOC~~ SOAJ: 1.0000 mg | SUBCUTANEOUS | 0 refills | Status: AC

## 2022-01-21 MED ORDER — SEMAGLUTIDE-WEIGHT MANAGEMENT 2.4 MG/0.75ML ~~LOC~~ SOAJ: 2.4000 mg | SUBCUTANEOUS | 0 refills | Status: DC

## 2022-01-21 MED ORDER — SEMAGLUTIDE-WEIGHT MANAGEMENT 0.25 MG/0.5ML ~~LOC~~ SOAJ: 0.2500 mg | SUBCUTANEOUS | 0 refills | Status: AC

## 2022-01-21 MED ORDER — SEMAGLUTIDE-WEIGHT MANAGEMENT 0.5 MG/0.5ML ~~LOC~~ SOAJ: 0.5000 mg | SUBCUTANEOUS | 0 refills | Status: AC

## 2022-01-21 MED ORDER — SEMAGLUTIDE-WEIGHT MANAGEMENT 1.7 MG/0.75ML ~~LOC~~ SOAJ: 1.7000 mg | SUBCUTANEOUS | 0 refills | Status: DC

## 2022-01-21 MED ORDER — ONDANSETRON HCL 4 MG PO TABS: 4.0000 mg | ORAL_TABLET | Freq: Three times a day (TID) | ORAL | 0 refills | Status: DC | PRN

## 2022-01-21 NOTE — Patient Instructions (Addendum)
We will call you with lab results [facility] If approved by insurance: Begin Wegovy 0.25 mg injection weekly for 4 weeks, then increase to 0.5 mg injection for 4 weeks, then increase to 1 mg injection weekly for 4 weeks, then increase to 1.7 mg injection weekly for 4 weeks, then increase to 2 .4 mg injection weekly  Take Zofran 4 mg as needed for nausea  Consider setting a quit date for smoking Follow-up in 593-months     Semaglutide Injection (Weight Management) What is this medication? SEMAGLUTIDE (SEM a GLOO tide) promotes weight loss. It may also be used to maintain weight loss. It works by decreasing appetite. Changes to diet and exercise are often combined with this medication. This medicine may be used for other purposes; ask your health care provider or pharmacist if you have questions. COMMON BRAND NAME(S): ZOXWRUWegovy What should I tell my care team before I take this medication? They need to know if you have any of these conditions: Endocrine tumors (MEN 2) or if someone in your family had these tumors Eye disease, vision problems Gallbladder disease History of depression or mental health disease History of pancreatitis Kidney disease Stomach or intestine problems Suicidal thoughts, plans, or attempt; a previous suicide attempt by you or a family member Thyroid cancer or if someone in your family had thyroid cancer An unusual or allergic reaction to semaglutide, other medications, foods, dyes, or preservatives Pregnant or trying to get pregnant Breast-feeding How should I use this medication? This medication is injected under the skin. You will be taught how to prepare and give it. Take it as directed on the prescription label. It is given once every week (every 7 days). Keep taking it unless your care team tells you to stop. It is important that you put your used needles and pens in a special sharps container. Do not put them in a trash can. If  you do not have a sharps container, call your pharmacist or care team to get one. A special MedGuide will be given to you by the pharmacist with each prescription and refill. Be sure to read this information carefully each time. This medication comes with INSTRUCTIONS FOR USE. Ask your pharmacist for directions on how to use this medication. Read the information carefully. Talk to your pharmacist or care team if you have questions. Talk to your care team about the use of this medication in children. While it may be prescribed for children as young as 12 years for selected conditions, precautions do apply. Overdosage: If you think you have taken too much of this medicine contact a poison control center or emergency room at once. NOTE: This medicine is only for you. Do not share this medicine with others. What if I miss a dose? If you miss a dose and the next scheduled dose is more than 2 days away, take the missed dose as soon as possible. If you miss a dose and the next scheduled dose is less than 2 days away, do not take the missed dose. Take the next dose at your regular time. Do not take double or extra doses. If you miss your dose for 2 weeks or more, take the next dose at your regular time or call your care team to talk about how to restart this medication. What may interact with this medication? Insulin and other medications for diabetes This list may not describe all possible interactions. Give your health care provider a list of  all the medicines, herbs, non-prescription drugs, or dietary supplements you use. Also tell them if you smoke, drink alcohol, or use illegal drugs. Some items may interact with your medicine. What should I watch for while using this medication? Visit your care team for regular checks on your progress. It may be some time before you see the benefit from this medication. Drink plenty of fluids while taking this medication. Check with your care team if you have severe  diarrhea, nausea, and vomiting, or if you sweat a lot. The loss of too much body fluid may make it dangerous for you to take this medication. This medication may affect blood sugar levels. Ask your care team if changes in diet or medications are needed if you have diabetes. If you or your family notice any changes in your behavior, such as new or worsening depression, thoughts of harming yourself, anxiety, other unusual or disturbing thoughts, or memory loss, call your care team right away. Women should inform their care team if they wish to become pregnant or think they might be pregnant. Losing weight while pregnant is not advised and may cause harm to the unborn child. Talk to your care team for more information. What side effects may I notice from receiving this medication? Side effects that you should report to your care team as soon as possible: Allergic reactions--skin rash, itching, hives, swelling of the face, lips, tongue, or throat Change in vision Dehydration--increased thirst, dry mouth, feeling faint or lightheaded, headache, dark yellow or brown urine Gallbladder problems--severe stomach pain, nausea, vomiting, fever Heart palpitations--rapid, pounding, or irregular heartbeat Kidney injury--decrease in the amount of urine, swelling of the ankles, hands, or feet Pancreatitis--severe stomach pain that spreads to your back or gets worse after eating or when touched, fever, nausea, vomiting Thoughts of suicide or self-harm, worsening mood, feelings of depression Thyroid cancer--new mass or lump in the neck, pain or trouble swallowing, trouble breathing, hoarseness Side effects that usually do not require medical attention (report to your care team if they continue or are bothersome): Diarrhea Loss of appetite Nausea Stomach pain Vomiting This list may not describe all possible side effects. Call your doctor for medical advice about side effects. You may report side effects to FDA at  1-800-FDA-1088. Where should I keep my medication? Keep out of the reach of children and pets. Refrigeration (preferred): Store in the refrigerator. Do not freeze. Keep this medication in the original container until you are ready to take it. Get rid of any unused medication after the expiration date. Room temperature: If needed, prior to cap removal, the pen can be stored at room temperature for up to 28 days. Protect from light. If it is stored at room temperature, get rid of any unused medication after 28 days or after it expires, whichever is first. It is important to get rid of the medication as soon as you no longer need it or it is expired. You can do this in two ways: Take the medication to a medication take-back program. Check with your pharmacy or law enforcement to find a location. If you cannot return the medication, follow the directions in the MedGuide. NOTE: This sheet is a summary. It may not cover all possible information. If you have questions about this medicine, talk to your doctor, pharmacist, or health care provider.  2023 Elsevier/Gold Standard (2020-07-17 00:00:00)   Managing the Challenge of Quitting Smoking Quitting smoking is a physical and mental challenge. You may have cravings, withdrawal symptoms, and  temptation to smoke. Before quitting, work with your health care provider to make a plan that can help you manage quitting. Making a plan before you quit may keep you from smoking when you have the urge to smoke while trying to quit. How to manage lifestyle changes Managing stress Stress can make you want to smoke, and wanting to smoke may cause stress. It is important to find ways to manage your stress. You could try some of the following: Practice relaxation techniques. Breathe slowly and deeply, in through your nose and out through your mouth. Listen to music. Soak in a bath or take a shower. Imagine a peaceful place or vacation. Get some support. Talk with  family or friends about your stress. Join a support group. Talk with a counselor or therapist. Get some physical activity. Go for a walk, run, or bike ride. Play a favorite sport. Practice yoga.  Medicines Talk with your health care provider about medicines that might help you deal with cravings and make quitting easier for you. Relationships Social situations can be difficult when you are quitting smoking. To manage this, you can: Avoid parties and other social situations where people might be smoking. Avoid alcohol. Leave right away if you have the urge to smoke. Explain to your family and friends that you are quitting smoking. Ask for support and let them know you might be a bit grumpy. Plan activities where smoking is not an option. General instructions Be aware that many people gain weight after they quit smoking. However, not everyone does. To keep from gaining weight, have a plan in place before you quit, and stick to the plan after you quit. Your plan should include: Eating healthy snacks. When you have a craving, it may help to: Eat popcorn, or try carrots, celery, or other cut vegetables. Chew sugar-free gum. Changing how you eat. Eat small portion sizes at meals. Eat 4-6 small meals throughout the day instead of 1-2 large meals a day. Be mindful when you eat. You should avoid watching television or doing other things that might distract you as you eat. Exercising regularly. Make time to exercise each day. If you do not have time for a long workout, do short bouts of exercise for 5-10 minutes several times a day. Do some form of strengthening exercise, such as weight lifting. Do some exercise that gets your heart beating and causes you to breathe deeply, such as walking fast, running, swimming, or biking. This is very important. Drinking plenty of water or other low-calorie or no-calorie drinks. Drink enough fluid to keep your urine pale yellow.  How to recognize withdrawal  symptoms Your body and mind may experience discomfort as you try to get used to not having nicotine in your system. These effects are called withdrawal symptoms. They may include: Feeling hungrier than normal. Having trouble concentrating. Feeling irritable or restless. Having trouble sleeping. Feeling depressed. Craving a cigarette. These symptoms may surprise you, but they are normal to have when quitting smoking. To manage withdrawal symptoms: Avoid places, people, and activities that trigger your cravings. Remember why you want to quit. Get plenty of sleep. Avoid coffee and other drinks that contain caffeine. These may worsen some of your symptoms. How to manage cravings Come up with a plan for how to deal with your cravings. The plan should include the following: A definition of the specific situation you want to deal with. An activity or action you will take to replace smoking. A clear idea for how  this action will help. The name of someone who could help you with this. Cravings usually last for 5-10 minutes. Consider taking the following actions to help you with your plan to deal with cravings: Keep your mouth busy. Chew sugar-free gum. Suck on hard candies or a straw. Brush your teeth. Keep your hands and body busy. Change to a different activity right away. Squeeze or play with a ball. Do an activity or a hobby, such as making bead jewelry, practicing needlepoint, or working with wood. Mix up your normal routine. Take a short exercise break. Go for a quick walk, or run up and down stairs. Focus on doing something kind or helpful for someone else. Call a friend or family member to talk during a craving. Join a support group. Contact a quitline. Where to find support To get help or find a support group: Call the National Cancer Institute's Smoking Quitline: 1-800-QUIT-NOW (310)872-2616) Text QUIT to SmokefreeTXT: 009381 Where to find more information Visit these websites to  find more information on quitting smoking: [place] Department of Health and Human Services: www.smokefree.gov American Lung Association: www.freedomfromsmoking.org Centers for Disease Control and Prevention (CDC): FootballExhibition.com.br American Heart Association: www.heart.org Contact a health care provider if: You want to change your plan for quitting. The medicines you are taking are not helping. Your eating feels out of control or you cannot sleep. You feel depressed or become very anxious. Summary Quitting smoking is a physical and mental challenge. You will face cravings, withdrawal symptoms, and temptation to smoke again. Preparation can help you as you go through these challenges. Try different techniques to manage stress, handle social situations, and prevent weight gain. You can deal with cravings by keeping your mouth busy (such as by chewing gum), keeping your hands and body busy, calling family or friends, or contacting a quitline for people who want to quit smoking. You can deal with withdrawal symptoms by avoiding places where people smoke, getting plenty of rest, and avoiding drinks that contain caffeine. This information is not intended to replace advice given to you by your health care provider. Make sure you discuss any questions you have with your health care provider. Document Revised: 04/24/2021 Document Reviewed: 04/24/2021 Elsevier Patient Education  2023 Elsevier Inc.    Hypogonadism, Male  Male hypogonadism is a condition of having a level of testosterone that is lower than normal. Testosterone is a chemical, or hormone, that is made mainly in the testicles. In boys, testosterone is responsible for the development of male characteristics during puberty. These include: Making the penis bigger. Growing and building the muscles. Growing facial hair. Deepening the voice. In adult men, testosterone is responsible for maintaining: An interest in sex and the ability to have  sex. Muscle mass. Sperm production. Red blood cell production. Bone strength. Testosterone also gives men energy and a sense of well-being. Testosterone normally decreases as men age and the testicles make less testosterone. Testosterone levels can vary from man to man. Not all men will have signs and symptoms of low testosterone. Weight, alcohol use, medicines, and certain medical conditions can affect a man's testosterone level. What are the causes? This condition is caused by: A natural decrease in testosterone that occurs as a man grows older. This is the main cause of this condition. Use of medicines, such as antidepressants, steroids, and opioids. Diseases and conditions that affect the testicles or the making of testosterone. These include: Injury or damage to the testicles from trauma, cancer, cancer treatment, or infection. Diabetes.  Sleep apnea. Genetic conditions that men are born with. Disease of the pituitary gland. This gland is in the brain. It produces hormones. Obesity. Metabolic syndrome. This is a group of diseases that affect blood pressure, blood sugar, cholesterol, and belly fat. HIV or AIDS. Alcohol abuse. Kidney failure. Other long-term or chronic diseases. What are the signs or symptoms? Common symptoms of this condition include: Loss of interest in sex (low sex drive). Inability to have or maintain an erection (erectile dysfunction). Feeling tired (fatigue). Mood changes, like irritability or depression. Loss of muscle and body hair. Infertility. Large breasts. Weight gain (obesity). How is this diagnosed? Your health care provider can diagnose hypogonadism based on: Your signs and symptoms. A physical exam to check your testosterone levels. This includes blood tests. Testosterone levels can change throughout the day. Levels are highest in the morning. You may need to have repeat blood tests before getting a diagnosis of hypogonadism. Depending on your  medical history and test results, your health care provider may also do other tests to find the cause of low testosterone. How is this treated? This condition is treated with testosterone replacement therapy. Testosterone can be given by: Injection or through pellets inserted under the skin. Gels or patches placed on the skin or in the mouth. Testosterone therapy is not for everyone. It has risks and side effects. Your health care provider will consider your medical history, your risk for prostate cancer, your age, and your symptoms before putting you on testosterone replacement therapy. Follow these instructions at home: Take over-the-counter and prescription medicines only as told by your health care provider. Eat foods that are high in fiber, such as beans, whole grains, and fresh fruits and vegetables. Limit foods that are high in fat and processed sugars, such as fried or sweet foods. If you drink alcohol: Limit how much you have to 0-2 drinks a day. Know how much alcohol is in your drink. In the [place] one drink equals one 12 oz bottle of beer (355 mL), one 5 oz glass of wine (148 mL), or one 1 oz glass of hard liquor (44 mL). Return to your normal activities as told by your health care provider. Ask your health care provider what activities are safe for you. Keep all follow-up visits. This is important. Contact a health care provider if: You have any of the signs or symptoms of low testosterone. You have any side effects from testosterone therapy. Summary Male hypogonadism is a condition of having a level of testosterone that is lower than normal. The natural drop in testosterone production that occurs with age is the most common cause of this condition. Low testosterone can also be caused by many diseases and conditions that affect the testicles and the making of testosterone. This condition is treated with testosterone replacement therapy. There are risks and side effects of  testosterone therapy. Your health care provider will consider your age, medical history, symptoms, and risks for prostate cancer before putting you on testosterone therapy. This information is not intended to replace advice given to you by your health care provider. Make sure you discuss any questions you have with your health care provider. Document Revised: 01/03/2020 Document Reviewed: 01/03/2020 Elsevier Patient Education  2023 [employer].

## 2022-01-22 LAB — COMPREHENSIVE METABOLIC PANEL
ALT: 16 IU/L (ref 0–44)
AST: 19 IU/L (ref 0–40)
Albumin/Globulin Ratio: 1.6 (ref 1.2–2.2)
Albumin: 4.2 g/dL (ref 3.8–4.9)
Alkaline Phosphatase: 61 IU/L (ref 44–121)
BUN/Creatinine Ratio: 10 (ref 9–20)
BUN: 10 mg/dL (ref 6–24)
Bilirubin Total: 0.6 mg/dL (ref 0.0–1.2)
CO2: 19 mmol/L — ABNORMAL LOW (ref 20–29)
Calcium: 9.3 mg/dL (ref 8.7–10.2)
Chloride: 98 mmol/L (ref 96–106)
Creatinine, Ser: 1 mg/dL (ref 0.76–1.27)
Globulin, Total: 2.6 g/dL (ref 1.5–4.5)
Glucose: 131 mg/dL — ABNORMAL HIGH (ref 70–99)
Potassium: 4.7 mmol/L (ref 3.5–5.2)
Sodium: 133 mmol/L — ABNORMAL LOW (ref 134–144)
Total Protein: 6.8 g/dL (ref 6.0–8.5)
eGFR: 89 mL/min/{1.73_m2} (ref >59)

## 2022-01-22 LAB — CBC WITH DIFFERENTIAL/PLATELET
Basophils Absolute: 0.1 10*3/uL (ref 0.0–0.2)
Basos: 1 %
EOS (ABSOLUTE): 0.1 10*3/uL (ref 0.0–0.4)
Eos: 1 %
Hematocrit: 50.2 % (ref 37.5–51.0)
Hemoglobin: 16.7 g/dL (ref 13.0–17.7)
Immature Grans (Abs): 0 10*3/uL (ref 0.0–0.1)
Immature Granulocytes: 0 %
Lymphocytes Absolute: 1.5 10*3/uL (ref 0.7–3.1)
Lymphs: 18 %
MCH: 29.8 pg (ref 26.6–33.0)
MCHC: 33.3 g/dL (ref 31.5–35.7)
MCV: 90 fL (ref 79–97)
Monocytes Absolute: 0.7 10*3/uL (ref 0.1–0.9)
Monocytes: 9 %
Neutrophils Absolute: 6 10*3/uL (ref 1.4–7.0)
Neutrophils: 71 %
Platelets: 230 10*3/uL (ref 150–450)
RBC: 5.61 x10E6/uL (ref 4.14–5.80)
RDW: 12.3 % (ref 11.6–15.4)
WBC: 8.5 10*3/uL (ref 3.4–10.8)

## 2022-01-22 LAB — LIPID PANEL
Chol/HDL Ratio: 3.2 ratio (ref 0.0–5.0)
Cholesterol, Total: 103 mg/dL (ref 100–199)
HDL: 32 mg/dL — ABNORMAL LOW (ref >39)
LDL Chol Calc (NIH): 52 mg/dL (ref 0–99)
Triglycerides: 98 mg/dL (ref 0–149)
VLDL Cholesterol Cal: 19 mg/dL (ref 5–40)

## 2022-01-22 LAB — CARDIOVASCULAR RISK ASSESSMENT

## 2022-01-22 LAB — HEMOGLOBIN A1C
Est. average glucose Bld gHb Est-mCnc: 128 mg/dL
Hgb A1c MFr Bld: 6.1 % — ABNORMAL HIGH (ref 4.8–5.6)

## 2022-01-24 ENCOUNTER — Encounter: Payer: Self-pay | Admitting: Nurse Practitioner

## 2022-01-26 ENCOUNTER — Encounter: Payer: Self-pay | Admitting: Nurse Practitioner

## 2022-01-26 LAB — TESTOSTERONE,FREE AND TOTAL
Testosterone, Free: 13.6 pg/mL (ref 7.2–24.0)
Testosterone: 1107 ng/dL — ABNORMAL HIGH (ref 264–916)

## 2022-01-27 ENCOUNTER — Other Ambulatory Visit: Payer: Self-pay

## 2022-01-27 DIAGNOSIS — E291 Testicular hypofunction: Secondary | ICD-10-CM

## 2022-01-27 DIAGNOSIS — R7989 Other specified abnormal findings of blood chemistry: Secondary | ICD-10-CM

## 2022-01-27 MED ORDER — TESTOSTERONE CYPIONATE 100 MG/ML IM SOLN: 100.0000 mg | INTRAMUSCULAR | 2 refills | Status: DC

## 2022-02-01 ENCOUNTER — Telehealth: Payer: Self-pay

## 2022-02-01 NOTE — Telephone Encounter (Signed)
PA submitted and denied via covermymeds for Wegovy. 

## 2022-04-27 ENCOUNTER — Encounter: Payer: Self-pay | Admitting: Nurse Practitioner

## 2022-04-27 ENCOUNTER — Ambulatory Visit: Payer: 59 | Admitting: Nurse Practitioner

## 2022-04-27 VITALS — BP 122/80 | HR 88 | Temp 97.8°F | Ht 73.0 in | Wt 339.0 lb

## 2022-04-27 DIAGNOSIS — E782 Mixed hyperlipidemia: Secondary | ICD-10-CM

## 2022-04-27 DIAGNOSIS — E291 Testicular hypofunction: Secondary | ICD-10-CM

## 2022-04-27 DIAGNOSIS — I10 Essential (primary) hypertension: Secondary | ICD-10-CM | POA: Diagnosis not present

## 2022-04-27 DIAGNOSIS — R7303 Prediabetes: Secondary | ICD-10-CM

## 2022-04-27 DIAGNOSIS — F17218 Nicotine dependence, cigarettes, with other nicotine-induced disorders: Secondary | ICD-10-CM

## 2022-04-27 DIAGNOSIS — F17200 Nicotine dependence, unspecified, uncomplicated: Secondary | ICD-10-CM | POA: Insufficient documentation

## 2022-04-27 DIAGNOSIS — I2510 Atherosclerotic heart disease of native coronary artery without angina pectoris: Secondary | ICD-10-CM

## 2022-04-27 DIAGNOSIS — K219 Gastro-esophageal reflux disease without esophagitis: Secondary | ICD-10-CM

## 2022-04-27 HISTORY — DX: Prediabetes: R73.03

## 2022-04-27 HISTORY — DX: Mixed hyperlipidemia: E78.2

## 2022-04-27 HISTORY — DX: Nicotine dependence, unspecified, uncomplicated: F17.200

## 2022-04-27 HISTORY — DX: Testicular hypofunction: E29.1

## 2022-04-27 HISTORY — DX: Gastro-esophageal reflux disease without esophagitis: K21.9

## 2022-04-27 NOTE — Progress Notes (Signed)
Subjective:  Patient ID: Antonio Riley, male    DOB: 01-02-1966  Age: 56 y.o. MRN: [id number]  Chief Complaint  Patient presents with   Hyperlipidemia   Gastroesophageal Reflux    HPI   Pt presents for follow-up of prediabetes, GERD, and low testosterone. Pt is currently prescribed Testosterone 100 mg injection Q14 days. Last testosterone 1,107 and free testosterone 13.6 on 01/21/22. PSA normal 0.2 on 10/21/21.  Lipid/Cholesterol, Follow-up  Last lipid panel Other pertinent labs  Lab Results  Component Value Date   CHOL 103 01/21/2022   HDL 32 (L) 01/21/2022   LDLCALC 52 01/21/2022   TRIG 98 01/21/2022   CHOLHDL 3.2 01/21/2022   Lab Results  Component Value Date   ALT 16 01/21/2022   AST 19 01/21/2022   PLT 230 01/21/2022   TSH 1.380 10/21/2021     He was last seen for this 3 months ago.  Management includes Crestor 20 mg QD.  He reports excellent compliance with treatment. He is not having side effects.   Current diet: in general, a "healthy" diet   Current exercise: yard work  GERD, Follow up:  The patient was last seen for GERD 3 months ago. Current treatment consist of: Omeprazole 20 mg He reports excellent compliance with treatment. He is not having side effects.  He is NOT experiencing choking on food or dysphagia    CAD: Management includes coreg 12.5 mg, lasix 20 mg Saw cardiologist last 01/2021 He reports excellent compliance with treatment. He is not having side effects.  He is following a Regular diet. Use of agents associated with hypertension: NSAIDS.  Prediabetes, Follow-up  Lab Results  Component Value Date   HGBA1C 6.1 (H) 01/21/2022   HGBA1C 6.2 (H) 10/21/2021   HGBA1C 6.0 (H) 08/22/2019   GLUCOSE 131 (H) 01/21/2022   GLUCOSE 135 (H) 10/21/2021   GLUCOSE 108 (H) 02/19/2021    Last seen for for this4 months ago.  Management since that visit includes diet modification and increased physical activity. Current symptoms  include none and have been stable.  Prior visit with dietician: no Current diet: well balanced Current exercise: walking  Pertinent Labs:    Component Value Date/Time   CHOL 103 01/21/2022 0814   TRIG 98 01/21/2022 0814   CHOLHDL 3.2 01/21/2022 0814   CREATININE 1.00 01/21/2022 0814    Wt Readings from Last 3 Encounters:  04/27/22 (!) 339 lb (153.8 kg)  01/21/22 (!) 345 lb (156.5 kg)  11/26/21 (!) 342 lb (155.1 kg)      Current Outpatient Medications on File Prior to Visit  Medication Sig Dispense Refill   aspirin EC (EQ ASPIRIN ADULT LOW DOSE) 81 MG tablet Take 1 tablet (81 mg total) by mouth daily. 90 tablet 2   Calcium Carbonate-Vitamin D (CALCIUM 600/VITAMIN D PO) Take 1 tablet by mouth daily.     carvedilol (COREG) 12.5 MG tablet Take 1 tablet (12.5 mg total) by mouth 2 (two) times daily. 180 tablet 2   fluticasone (FLONASE) 50 MCG/ACT nasal spray Place 2 sprays into both nostrils daily. 16 g 6   furosemide (LASIX) 20 MG tablet Take 1 tablet (20 mg total) by mouth 3 (three) times a week. 41 tablet 3   omeprazole (PRILOSEC) 20 MG capsule Take 1 capsule (20 mg total) by mouth daily. 90 capsule 3   ondansetron (ZOFRAN) 4 MG tablet Take 1 tablet (4 mg total) by mouth every 8 (eight) hours as needed for nausea or vomiting. 20 tablet  0   OVER THE COUNTER MEDICATION Take 1 tablet by mouth daily. Allergy Medicine     rosuvastatin (CRESTOR) 20 MG tablet Take 1 tablet (20 mg total) by mouth daily. 90 tablet 3   Semaglutide-Weight Management 1.7 MG/0.75ML SOAJ Inject 1.7 mg into the skin once a week for 28 days. 3 mL 0   [START ON 05/17/2022] Semaglutide-Weight Management 2.4 MG/0.75ML SOAJ Inject 2.4 mg into the skin once a week for 28 days. 3 mL 0   spironolactone (ALDACTONE) 25 MG tablet Take 1 tablet (25 mg total) by mouth daily. 90 tablet 1   telmisartan (MICARDIS) 40 MG tablet Take 1 tablet (40 mg total) by mouth daily. 90 tablet 3   testosterone cypionate (DEPO-TESTOSTERONE) 100  MG/ML injection Inject 1 mL (100 mg total) into the muscle every 14 (fourteen) days. For IM use only 10 mL 2   No current facility-administered medications on file prior to visit.   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   SVG to OM, SVG to RCA, EVH via bilateral thighs   Swelling of lower leg    Past Surgical History:  Procedure Laterality Date   AORTIC VALVE REPLACEMENT N/A 08/24/2019   Procedure: AORTIC VALVE REPLACEMENT (AVR) USING INSPIRIS VALVE SIZE ;  Surgeon: Purcell Nails, MD;  Location: [facility] OR;  Service: Open Heart Surgery;  Laterality: N/A;   CORONARY ARTERY BYPASS GRAFT N/A 08/24/2019   Procedure: CORONARY ARTERY BYPASS GRAFTING (CABG) x two, using bilateral leg greater saphenous veins harvested endoscopically;  Surgeon: Purcell Nails, MD;  Location: [facility] OR;  Service: Open Heart Surgery;  Laterality: N/A;   FEMUR SURGERY     RIGHT/LEFT HEART CATH AND CORONARY ANGIOGRAPHY N/A 07/31/2019   Procedure: RIGHT/LEFT HEART CATH AND CORONARY ANGIOGRAPHY;  Surgeon: Tonny Bollman, MD;  Location: [facility] INVASIVE CV LAB;  Service: Cardiovascular;  Laterality: N/A;   TEE WITHOUT CARDIOVERSION N/A 08/24/2019   Procedure: TRANSESOPHAGEAL ECHOCARDIOGRAM (TEE);  Surgeon: Purcell Nails, MD;  Location: [facility] OR;  Service: Open Heart Surgery;  Laterality: N/A;   WRIST SURGERY      Family History  Problem Relation Age of Onset   CAD Mother    Deep vein thrombosis Mother    Heart murmur Mother    Diabetes Mother    Multiple sclerosis Brother    Diabetes Cousin    Social History   Socioeconomic History   Marital status: Married    Spouse  name: Morrie Sheldon Kowaleski   Number of children: 2   Years of education: Not on file   Highest education level: Not on file  Occupational History   Not on file  Tobacco Use   Smoking status: Every Day    Packs/day: 1.00    Years: 28.00    Total pack years: 28.00    Types: Cigarettes    Last attempt to quit: 08/21/2019    Years since quitting: 2.6   Smokeless tobacco: Never   Tobacco comments:    trying to quit  Vaping Use   Vaping Use: Never used  Substance and Sexual  Activity   Alcohol use: Yes    Comment: occ beer    Drug use: Not Currently   Sexual activity: Yes    Partners: Female  Other Topics Concern   Not on file  Social History Narrative   Not on file   Social Determinants of Health   Financial Resource Strain: Low Risk  (10/20/2021)   Overall Financial Resource Strain (CARDIA)    Difficulty of Paying Living Expenses: Not hard at all  Food Insecurity: No Food Insecurity (10/20/2021)   Hunger Vital Sign    Worried About Running Out of Food in the Last Year: Never true    Ran Out of Food in the Last Year: Never true  Transportation Needs: No Transportation Needs (10/20/2021)   PRAPARE - Administrator, Civil ServiceTransportation    Lack of Transportation (Medical): No    Lack of Transportation (Non-Medical): No  Physical Activity: Inactive (10/20/2021)   Exercise Vital Sign    Days of Exercise per Week: 0 days    Minutes of Exercise per Session: 0 min  Stress: No Stress Concern Present (10/20/2021)   [employer] - Occupational Stress Questionnaire    Feeling of Stress : Not at all  Social Connections: Moderately Isolated (10/20/2021)   Social Connection and Isolation Panel [NHANES]    Frequency of Communication with Friends and Family: More than three times a week    Frequency of Social Gatherings with Friends and Family: More than three times a week    Attends Religious Services: Never    Database administratorActive Member of Clubs or Organizations: No    Attends BankerClub or Organization Meetings:  Never    Marital Status: Married    Review of Systems  Constitutional:  Negative for chills, diaphoresis, fatigue and fever.  HENT:  Negative for congestion, ear pain and sore throat.   Respiratory:  Negative for cough and shortness of breath.   Cardiovascular:  Negative for chest pain and leg swelling.  Gastrointestinal:  Negative for abdominal pain, constipation, diarrhea, nausea and vomiting.  Genitourinary:  Negative for dysuria and urgency.  Musculoskeletal:  Negative for arthralgias and myalgias.  Neurological:  Negative for dizziness and headaches.  Psychiatric/Behavioral:  Negative for dysphoric mood.      Objective:  BP 122/80   Pulse 88   Temp 97.8 F (36.6 C)   Ht 6\' 1"  (1.854 m)   Wt (!) 339 lb (153.8 kg)   SpO2 98%   BMI 44.73 kg/m        04/27/2022    7:49 AM 01/21/2022    7:36 AM 11/26/2021    8:24 AM  BP/Weight  Systolic BP  124 [id number]  Diastolic BP  82 92  Wt. (Lbs) 339 345 342  BMI 44.73 kg/m2 45.52 kg/m2 43.91 kg/m2    Physical Exam Vitals reviewed.  Constitutional:      Appearance: He is obese.  HENT:     Right Ear: Tympanic membrane normal.     Left Ear: Tympanic membrane normal.     Nose: Nose normal.     Mouth/Throat:     Mouth: Mucous membranes are moist.  Eyes:     Pupils: Pupils are equal, round, and reactive to light.  Cardiovascular:     Rate and Rhythm: Normal rate and regular rhythm.     Pulses: Normal pulses.     Heart sounds: Murmur heard.  Pulmonary:     Effort: Pulmonary effort is normal.     Breath sounds: Normal breath sounds.  Abdominal:     General: Bowel sounds are normal.     Palpations: Abdomen is soft.  Skin:    General: Skin is warm and dry.     Capillary Refill: Capillary refill takes less than 2 seconds.  Neurological:     General: No focal deficit present.     Mental Status: He is alert and oriented to person, place, and time.  Psychiatric:        Mood and Affect: Mood normal.        Behavior: Behavior  normal.         Lab Results  Component Value Date   WBC 8.5 01/21/2022   HGB 16.7 01/21/2022   HCT 50.2 01/21/2022   PLT 230 01/21/2022   GLUCOSE 131 (H) 01/21/2022   CHOL 103 01/21/2022   TRIG 98 01/21/2022   HDL 32 (L) 01/21/2022   LDLCALC 52 01/21/2022   ALT 16 01/21/2022   AST 19 01/21/2022   NA 133 (L) 01/21/2022   K 4.7 01/21/2022   CL 98 01/21/2022   CREATININE 1.00 01/21/2022   BUN 10 01/21/2022   CO2 19 (L) 01/21/2022   TSH 1.380 10/21/2021   INR 1.8 (H) 08/24/2019   HGBA1C 6.1 (H) 01/21/2022      Assessment & Plan:   1. Benign hypertension-well controlled - CBC with Differential/Platelet - Comprehensive metabolic panel -continue Coreg 12.5 mg,   2. Mixed hyperlipidemia-well controlled - CBC with Differential/Platelet - Comprehensive metabolic panel - Lipid panel  3. Male hypogonadism-stable - Testosterone,Free and Total  4. Prediabetes-stable - CBC with Differential/Platelet - Comprehensive metabolic panel - Hemoglobin A1c -heart healthy low carb diet  5. Coronary artery disease involving native coronary artery of native heart without angina pectoris - CBC with Differential/Platelet - Comprehensive metabolic panel - Lipid panel -follow up with cardiology as scheduled -continue ASA, Coreg, and Crestor 20 mg QD  6. Cigarette nicotine dependence with other nicotine-induced disorder - CBC with Differential/Platelet - Comprehensive metabolic panel -recommend cessation  7. Gastroesophageal reflux disease, unspecified whether esophagitis present-well controlled - CBC with Differential/Platelet - Comprehensive metabolic panel -continue Omeprazole 20 mg QD -avoid foods that trigger GERD   Continue medications  We will call you with lab results Recommend diabetic eye exam Recommend smoking cessation Follow-up in 59-months, fasting    Follow-up: 52-months, fasting  An After Visit Summary was printed and given to the patient.  I, Janie Morning, NP, have reviewed all documentation for this visit. The documentation on 04/27/22 for the exam, diagnosis, procedures, and orders are all accurate and complete.   Signed, Janie Morning, NP [facility] [phone]

## 2022-04-27 NOTE — Patient Instructions (Addendum)
Prediabetes Prediabetes is when your blood sugar (blood glucose) level is higher than normal but not high enough for you to be diagnosed with type 2 diabetes. Having prediabetes puts you at risk for developing type 2 diabetes (type 2 diabetes mellitus). With certain lifestyle changes, you may be able to prevent or delay the onset of type 2 diabetes. This is important because type 2 diabetes can lead to serious complications, such as: Heart disease. Stroke. Blindness. Kidney disease. Depression. Poor circulation in the feet and legs. In severe cases, this could lead to surgical removal of a leg (amputation). What are the causes? The exact cause of prediabetes is not known. It may result from insulin resistance. Insulin resistance develops when cells in the body do not respond properly to insulin that the body makes. This can cause excess glucose to build up in the blood. High blood glucose (hyperglycemia) can develop. What increases the risk? The following factors may make you more likely to develop this condition: You have a family member with type 2 diabetes. You are older than 45 years. You had a temporary form of diabetes during a pregnancy (gestational diabetes). You had polycystic ovary syndrome (PCOS). You are overweight or obese. You are inactive (sedentary). You have a history of heart disease, including problems with cholesterol levels, high levels of blood fats, or high blood pressure. What are the signs or symptoms? You may have no symptoms. If you do have symptoms, they may include: Increased hunger. Increased thirst. Increased urination. Vision changes, such as blurry vision. Tiredness (fatigue). How is this diagnosed? This condition can be diagnosed with blood tests. Your blood glucose may be checked with one or more of the following tests: A fasting blood glucose (FBG) test. You will not be allowed to eat (you will fast) for at least 8 hours before a blood sample is  taken. An A1C blood test (hemoglobin A1C). This test provides information about blood glucose levels over the previous 2?3 months. An oral glucose tolerance test (OGTT). This test measures your blood glucose at two points in time: After fasting. This is your baseline level. Two hours after you drink a beverage that contains glucose. You may be diagnosed with prediabetes if: Your FBG is 100?125 mg/dL (5.6-6.9 mmol/L). Your A1C level is 5.7?6.4% (39-46 mmol/mol). Your OGTT result is 140?199 mg/dL (7.8-11 mmol/L). These blood tests may be repeated to confirm your diagnosis. How is this treated? Treatment may include dietary and lifestyle changes to help lower your blood glucose and prevent type 2 diabetes from developing. In some cases, medicine may be prescribed to help lower the risk of type 2 diabetes. Follow these instructions at home: Nutrition  Follow a healthy meal plan. This includes eating lean proteins, whole grains, legumes, fresh fruits and vegetables, low-fat dairy products, and healthy fats. Follow instructions from your health care provider about eating or drinking restrictions. Meet with a dietitian to create a healthy eating plan that is right for you. Lifestyle Do moderate-intensity exercise for at least 30 minutes a day on 5 or more days each week, or as told by your health care provider. A mix of activities may be best, such as: Brisk walking, swimming, biking, and weight lifting. Lose weight as told by your health care provider. Losing 5-7% of your body weight can reverse insulin resistance. Do not drink alcohol if: Your health care provider tells you not to drink. You are pregnant, may be pregnant, or are planning to become pregnant. If you drink alcohol:  Limit how much you use to: 0-1 drink a day for women. 0-2 drinks a day for men. Be aware of how much alcohol is in your drink. In the [place] one drink equals one 12 oz bottle of beer (355 mL), one 5 oz glass of wine  (148 mL), or one 1 oz glass of hard liquor (44 mL). General instructions Take over-the-counter and prescription medicines only as told by your health care provider. You may be prescribed medicines that help lower the risk of type 2 diabetes. Do not use any products that contain nicotine or tobacco, such as cigarettes, e-cigarettes, and chewing tobacco. If you need help quitting, ask your health care provider. Keep all follow-up visits. This is important. Where to find more information American Diabetes Association: www.diabetes.org Academy of Nutrition and Dietetics: www.eatright.org American Heart Association: www.heart.org Contact a health care provider if: You have any of these symptoms: Increased hunger. Increased urination. Increased thirst. Fatigue. Vision changes, such as blurry vision. Get help right away if you: Have shortness of breath. Feel confused. Vomit or feel like you may vomit. Summary Prediabetes is when your blood sugar (blood glucose)level is higher than normal but not high enough for you to be diagnosed with type 2 diabetes. Having prediabetes puts you at risk for developing type 2 diabetes (type 2 diabetes mellitus). Make lifestyle changes such as eating a healthy diet and exercising regularly to help prevent diabetes. Lose weight as told by your health care provider. This information is not intended to replace advice given to you by your health care provider. Make sure you discuss any questions you have with your health care provider. Document Revised: 08/02/2019 Document Reviewed: 08/02/2019 Elsevier Patient Education  [place]. Prediabetes Eating Plan Prediabetes is a condition that causes blood sugar (glucose) levels to be higher than normal. This increases the risk for developing type 2 diabetes (type 2 diabetes mellitus). Working with a health care provider or nutrition specialist (dietitian) to make diet and lifestyle changes can help prevent the  onset of diabetes. These changes may help you: Control your blood glucose levels. Improve your cholesterol levels. Manage your blood pressure. What are tips for following this plan? Reading food labels Read food labels to check the amount of fat, salt (sodium), and sugar in prepackaged foods. Avoid foods that have: Saturated fats. Trans fats. Added sugars. Avoid foods that have more than 300 milligrams (mg) of sodium per serving. Limit your sodium intake to less than 2,300 mg each day. Shopping Avoid buying pre-made and processed foods. Avoid buying drinks with added sugar. Cooking Cook with olive oil. Do not use butter, lard, or ghee. Bake, broil, grill, steam, or boil foods. Avoid frying. Meal planning  Work with your dietitian to create an eating plan that is right for you. This may include tracking how many calories you take in each day. Use a food diary, notebook, or mobile application to track what you eat at each meal. Consider following a Mediterranean diet. This includes: Eating several servings of fresh fruits and vegetables each day. Eating fish at least twice a week. Eating one serving each day of whole grains, beans, nuts, and seeds. Using olive oil instead of other fats. Limiting alcohol. Limiting red meat. Using nonfat or low-fat dairy products. Consider following a plant-based diet. This includes dietary choices that focus on eating mostly vegetables and fruit, grains, beans, nuts, and seeds. If you have high blood pressure, you may need to limit your sodium intake or follow a  diet such as the DASH (Dietary Approaches to Stop Hypertension) eating plan. The DASH diet aims to lower high blood pressure. Lifestyle Set weight loss goals with help from your health care team. It is recommended that most people with prediabetes lose 7% of their body weight. Exercise for at least 30 minutes 5 or more days a week. Attend a support group or seek support from a mental health  counselor. Take over-the-counter and prescription medicines only as told by your health care provider. What foods are recommended? Fruits Berries. Bananas. Apples. Oranges. Grapes. Papaya. Mango. Pomegranate. Kiwi. Grapefruit. Cherries. Vegetables Lettuce. Spinach. Peas. Beets. Cauliflower. Cabbage. Broccoli. Carrots. Tomatoes. Squash. Eggplant. Herbs. Peppers. Onions. Cucumbers. Brussels sprouts. Grains Whole grains, such as whole-wheat or whole-grain breads, crackers, cereals, and pasta. Unsweetened oatmeal. Bulgur. Barley. Quinoa. Brown rice. Corn or whole-wheat flour tortillas or taco shells. Meats and other proteins Seafood. Poultry without skin. Lean cuts of pork and beef. Tofu. Eggs. Nuts. Beans. Dairy Low-fat or fat-free dairy products, such as yogurt, cottage cheese, and cheese. Beverages Water. Tea. Coffee. Sugar-free or diet soda. Seltzer water. Low-fat or nonfat milk. Milk alternatives, such as soy or almond milk. Fats and oils Olive oil. Canola oil. Sunflower oil. Grapeseed oil. Avocado. Walnuts. Sweets and desserts Sugar-free or low-fat pudding. Sugar-free or low-fat ice cream and other frozen treats. Seasonings and condiments Herbs. Sodium-free spices. Mustard. Relish. Low-salt, low-sugar ketchup. Low-salt, low-sugar barbecue sauce. Low-fat or fat-free mayonnaise. The items listed above may not be a complete list of recommended foods and beverages. Contact a dietitian for more information. What foods are not recommended? Fruits Fruits canned with syrup. Vegetables Canned vegetables. Frozen vegetables with butter or cream sauce. Grains Refined white flour and flour products, such as bread, pasta, snack foods, and cereals. Meats and other proteins Fatty cuts of meat. Poultry with skin. Breaded or fried meat. Processed meats. Dairy Full-fat yogurt, cheese, or milk. Beverages Sweetened drinks, such as iced tea and soda. Fats and oils Butter. Lard. Ghee. Sweets and  desserts Baked goods, such as cake, cupcakes, pastries, cookies, and cheesecake. Seasonings and condiments Spice mixes with added salt. Ketchup. Barbecue sauce. Mayonnaise. The items listed above may not be a complete list of foods and beverages that are not recommended. Contact a dietitian for more information. Where to find more information American Diabetes Association: www.diabetes.org Summary You may need to make diet and lifestyle changes to help prevent the onset of diabetes. These changes can help you control blood sugar, improve cholesterol levels, and manage blood pressure. Set weight loss goals with help from your health care team. It is recommended that most people with prediabetes lose 7% of their body weight. Consider following a Mediterranean diet. This includes eating plenty of fresh fruits and vegetables, whole grains, beans, nuts, seeds, fish, and low-fat dairy, and using olive oil instead of other fats. This information is not intended to replace advice given to you by your health care provider. Make sure you discuss any questions you have with your health care provider. Document Revised: 08/02/2019 Document Reviewed: 08/02/2019 Elsevier Patient Education  2023 [employer].

## 2022-04-29 LAB — COMPREHENSIVE METABOLIC PANEL
ALT: 18 IU/L (ref 0–44)
AST: 17 IU/L (ref 0–40)
Albumin/Globulin Ratio: 1.8 (ref 1.2–2.2)
Albumin: 4.5 g/dL (ref 3.8–4.9)
Alkaline Phosphatase: 56 IU/L (ref 44–121)
BUN/Creatinine Ratio: 12 (ref 9–20)
BUN: 12 mg/dL (ref 6–24)
Bilirubin Total: 0.8 mg/dL (ref 0.0–1.2)
CO2: 20 mmol/L (ref 20–29)
Calcium: 9.4 mg/dL (ref 8.7–10.2)
Chloride: 99 mmol/L (ref 96–106)
Creatinine, Ser: 1.02 mg/dL (ref 0.76–1.27)
Globulin, Total: 2.5 g/dL (ref 1.5–4.5)
Glucose: 139 mg/dL — ABNORMAL HIGH (ref 70–99)
Potassium: 4.9 mmol/L (ref 3.5–5.2)
Sodium: 132 mmol/L — ABNORMAL LOW (ref 134–144)
Total Protein: 7 g/dL (ref 6.0–8.5)
eGFR: 87 mL/min/{1.73_m2} (ref >59)

## 2022-04-29 LAB — LIPID PANEL
Chol/HDL Ratio: 3.1 ratio (ref 0.0–5.0)
Cholesterol, Total: 114 mg/dL (ref 100–199)
HDL: 37 mg/dL — ABNORMAL LOW (ref >39)
LDL Chol Calc (NIH): 60 mg/dL (ref 0–99)
Triglycerides: 85 mg/dL (ref 0–149)
VLDL Cholesterol Cal: 17 mg/dL (ref 5–40)

## 2022-04-29 LAB — CBC WITH DIFFERENTIAL/PLATELET
Basophils Absolute: 0.1 10*3/uL (ref 0.0–0.2)
Basos: 1 %
EOS (ABSOLUTE): 0.1 10*3/uL (ref 0.0–0.4)
Eos: 1 %
Hematocrit: 51.2 % — ABNORMAL HIGH (ref 37.5–51.0)
Hemoglobin: 16.7 g/dL (ref 13.0–17.7)
Immature Grans (Abs): 0.1 10*3/uL (ref 0.0–0.1)
Immature Granulocytes: 1 %
Lymphocytes Absolute: 1.2 10*3/uL (ref 0.7–3.1)
Lymphs: 14 %
MCH: 28.8 pg (ref 26.6–33.0)
MCHC: 32.6 g/dL (ref 31.5–35.7)
MCV: 88 fL (ref 79–97)
Monocytes Absolute: 0.7 10*3/uL (ref 0.1–0.9)
Monocytes: 8 %
Neutrophils Absolute: 6.6 10*3/uL (ref 1.4–7.0)
Neutrophils: 75 %
Platelets: 242 10*3/uL (ref 150–450)
RBC: 5.79 x10E6/uL (ref 4.14–5.80)
RDW: 12.5 % (ref 11.6–15.4)
WBC: 8.6 10*3/uL (ref 3.4–10.8)

## 2022-04-29 LAB — HEMOGLOBIN A1C

## 2022-04-29 LAB — CARDIOVASCULAR RISK ASSESSMENT

## 2022-04-30 LAB — TESTOSTERONE,FREE AND TOTAL
Testosterone, Free: 6.3 pg/mL — ABNORMAL LOW (ref 7.2–24.0)
Testosterone: 783 ng/dL (ref 264–916)

## 2022-07-17 ENCOUNTER — Other Ambulatory Visit: Payer: Self-pay

## 2022-07-17 DIAGNOSIS — I2510 Atherosclerotic heart disease of native coronary artery without angina pectoris: Secondary | ICD-10-CM

## 2022-07-17 MED ORDER — SPIRONOLACTONE 25 MG PO TABS: 25.0000 mg | ORAL_TABLET | Freq: Every day | ORAL | 1 refills | Status: DC

## 2022-07-27 ENCOUNTER — Telehealth: Payer: Self-pay

## 2022-07-27 NOTE — Telephone Encounter (Signed)
I left a message on the number(s) listed in the patients chart requesting the patient to call back regarding the [place] appointment for 08/27/2022. Antonio Riley's last day is 08/06/2022. The appointment has been canceled. Waiting for the patient to return the call.  THIS APPOINTMENT WILL NEED TO BE RESCHEDULED WITH SALLY OR DR. [facility] ON OR AROUND 10/16/2022

## 2022-07-28 NOTE — Telephone Encounter (Signed)
Appointment has been scheduled with Antonio Riley

## 2022-08-03 ENCOUNTER — Telehealth: Payer: Self-pay

## 2022-08-03 NOTE — Telephone Encounter (Signed)
Patient called stating that he is going to be having his first grand baby and that they recommended that anyone close in the family that would be holding the baby or taking care of the baby update their TDAP. Can we schedule patient for nurse visit to come in and update their TDAP?

## 2022-08-04 NOTE — Telephone Encounter (Signed)
Patient made aware, nurse visit schedule

## 2022-08-19 ENCOUNTER — Ambulatory Visit (INDEPENDENT_AMBULATORY_CARE_PROVIDER_SITE_OTHER): Payer: 59

## 2022-08-19 ENCOUNTER — Telehealth: Payer: Self-pay

## 2022-08-19 ENCOUNTER — Other Ambulatory Visit: Payer: Self-pay | Admitting: Physician Assistant

## 2022-08-19 DIAGNOSIS — E291 Testicular hypofunction: Secondary | ICD-10-CM

## 2022-08-19 DIAGNOSIS — R7989 Other specified abnormal findings of blood chemistry: Secondary | ICD-10-CM

## 2022-08-19 DIAGNOSIS — Z23 Encounter for immunization: Secondary | ICD-10-CM

## 2022-08-19 MED ORDER — TESTOSTERONE CYPIONATE 100 MG/ML IM SOLN: 100.0000 mg | INTRAMUSCULAR | 0 refills | Status: DC

## 2022-08-19 NOTE — Progress Notes (Signed)
   TDap vaccine given per patient request, patient tolerated well.   Erie Noe, LPN [id number]

## 2022-08-19 NOTE — Telephone Encounter (Signed)
Patient called requesting refill of testosterone.  He was originally started on 200 mg Q14 days 11/02/21 then decreased to 100 mg Q14 days on 01/27/22 - which is what he is currently taking.  His next OV is with Gay Filler on 10/25/22 however he is coming in this afternoon for a Tdap.  Component     Latest Ref Rng 10/21/2021 10/28/2021 01/21/2022 04/27/2022  Testosterone     264 - 916 ng/dL 72 (L)  55 (L)  1,107 (H)  783   Testosterone Free     7.2 - 24.0 pg/mL 1.2 (L)  1.3 (L)  13.6  6.3 (L)     Legend: (L) Low (H) High

## 2022-08-26 ENCOUNTER — Other Ambulatory Visit: Payer: Self-pay

## 2022-08-26 DIAGNOSIS — E291 Testicular hypofunction: Secondary | ICD-10-CM

## 2022-08-26 DIAGNOSIS — R7989 Other specified abnormal findings of blood chemistry: Secondary | ICD-10-CM

## 2022-08-26 MED ORDER — TESTOSTERONE CYPIONATE 100 MG/ML IM SOLN: 100.0000 mg | INTRAMUSCULAR | 0 refills | Status: DC

## 2022-08-26 NOTE — Telephone Encounter (Signed)
Pharmacy did not receive refill due to RX saying NO PRINT. Needs to be resent to [facility].

## 2022-08-27 ENCOUNTER — Ambulatory Visit: Payer: 59 | Admitting: Nurse Practitioner

## 2022-08-30 ENCOUNTER — Other Ambulatory Visit: Payer: Self-pay

## 2022-08-30 MED ORDER — "BD LUER-LOK SYRINGE 23G X 1"" 3 ML MISC": 1.0000 | 1 refills | Status: DC

## 2022-09-20 ENCOUNTER — Encounter: Payer: Self-pay | Admitting: Physician Assistant

## 2022-09-27 ENCOUNTER — Other Ambulatory Visit: Payer: Self-pay

## 2022-09-27 DIAGNOSIS — I2510 Atherosclerotic heart disease of native coronary artery without angina pectoris: Secondary | ICD-10-CM

## 2022-09-27 MED ORDER — ASPIRIN 81 MG PO TBEC: 81.0000 mg | DELAYED_RELEASE_TABLET | Freq: Every day | ORAL | 0 refills | Status: DC

## 2022-09-27 MED ORDER — CARVEDILOL 12.5 MG PO TABS: 12.5000 mg | ORAL_TABLET | Freq: Two times a day (BID) | ORAL | 0 refills | Status: DC

## 2022-09-27 MED ORDER — TELMISARTAN 40 MG PO TABS: 40.0000 mg | ORAL_TABLET | Freq: Every day | ORAL | 0 refills | Status: DC

## 2022-10-23 ENCOUNTER — Other Ambulatory Visit: Payer: Self-pay | Admitting: Physician Assistant

## 2022-10-25 ENCOUNTER — Other Ambulatory Visit: Payer: Self-pay | Admitting: Physician Assistant

## 2022-10-25 ENCOUNTER — Ambulatory Visit (INDEPENDENT_AMBULATORY_CARE_PROVIDER_SITE_OTHER): Payer: 59 | Admitting: Physician Assistant

## 2022-10-25 ENCOUNTER — Encounter: Payer: Self-pay | Admitting: Physician Assistant

## 2022-10-25 VITALS — BP 122/86 | HR 65 | Temp 97.2°F | Ht 73.0 in | Wt 327.6 lb

## 2022-10-25 DIAGNOSIS — K219 Gastro-esophageal reflux disease without esophagitis: Secondary | ICD-10-CM

## 2022-10-25 DIAGNOSIS — E782 Mixed hyperlipidemia: Secondary | ICD-10-CM

## 2022-10-25 DIAGNOSIS — R7989 Other specified abnormal findings of blood chemistry: Secondary | ICD-10-CM

## 2022-10-25 DIAGNOSIS — I10 Essential (primary) hypertension: Secondary | ICD-10-CM | POA: Diagnosis not present

## 2022-10-25 DIAGNOSIS — E291 Testicular hypofunction: Secondary | ICD-10-CM

## 2022-10-25 DIAGNOSIS — Z125 Encounter for screening for malignant neoplasm of prostate: Secondary | ICD-10-CM

## 2022-10-25 DIAGNOSIS — I2510 Atherosclerotic heart disease of native coronary artery without angina pectoris: Secondary | ICD-10-CM

## 2022-10-25 DIAGNOSIS — Z953 Presence of xenogenic heart valve: Secondary | ICD-10-CM

## 2022-10-25 DIAGNOSIS — Z1211 Encounter for screening for malignant neoplasm of colon: Secondary | ICD-10-CM

## 2022-10-25 DIAGNOSIS — R7303 Prediabetes: Secondary | ICD-10-CM | POA: Diagnosis not present

## 2022-10-25 MED ORDER — TESTOSTERONE CYPIONATE 100 MG/ML IM SOLN
100.0000 mg | INTRAMUSCULAR | 0 refills | Status: DC
Start: 2022-10-25 — End: 2023-03-17

## 2022-10-25 MED ORDER — CARVEDILOL 12.5 MG PO TABS
12.5000 mg | ORAL_TABLET | Freq: Two times a day (BID) | ORAL | 1 refills | Status: DC
Start: 2022-10-25 — End: 2023-03-17

## 2022-10-25 MED ORDER — ROSUVASTATIN CALCIUM 20 MG PO TABS
20.0000 mg | ORAL_TABLET | Freq: Every day | ORAL | 1 refills | Status: DC
Start: 2022-10-25 — End: 2023-03-17

## 2022-10-25 MED ORDER — OMEPRAZOLE 20 MG PO CPDR: 20.0000 mg | DELAYED_RELEASE_CAPSULE | Freq: Every day | ORAL | 1 refills | Status: DC

## 2022-10-25 MED ORDER — FUROSEMIDE 20 MG PO TABS
20.0000 mg | ORAL_TABLET | ORAL | 1 refills | Status: DC
Start: 2022-10-25 — End: 2023-03-17

## 2022-10-25 MED ORDER — TELMISARTAN 40 MG PO TABS
40.0000 mg | ORAL_TABLET | Freq: Every day | ORAL | 1 refills | Status: DC
Start: 2022-10-25 — End: 2023-03-17

## 2022-10-25 MED ORDER — SPIRONOLACTONE 25 MG PO TABS: 25.0000 mg | ORAL_TABLET | Freq: Every day | ORAL | 1 refills | Status: DC

## 2022-10-25 NOTE — Progress Notes (Signed)
Subjective:  Patient ID: Antonio Riley, male    DOB: [birth year]  Age: 57 y.o. MRN: [id number]  Chief Complaint  Patient presents with   Medical Management of Chronic Issues    HPI Pt presents for follow up of hypertension. The patient is tolerating the medication well without side effects. Compliance with treatment has been good; including taking medication as directed , maintains a healthy diet and regular exercise regimen , and following up as directed. Pt currently taking coreg 12.5mg  bid , micardis 40mg , lasix 20mg  and spironolactone 25mg   Pt with history of CAD and has had aortic valve replacement.  He had that done along with double bypass graft in 2021.  He follows with Dr Dulce Sellar regularly and will be due next month for his cardiology follow up.    Mixed hyperlipidemia  Pt presents with hyperlipidemia. Compliance with treatment has been good The patient is compliant with medications, maintains a low cholesterol diet , follows up as directed , and maintains an exercise regimen . The patient denies experiencing any hypercholesterolemia related symptoms. Currently taking crestor 20mg  qd  Pt with history of GERD - stable on prilosec 20mg  qd  Pt is due for colonoscopy but refuses - states he is agreeable to do cologuard  Pt with history of low testosterone.  He is currently on 100mg  every other week       10/25/2022   10:51 AM 04/27/2022    7:50 AM 10/20/2021    2:22 PM  Depression screen PHQ 2/9  Decreased Interest 0 0 0  Down, Depressed, Hopeless 0 0 0  PHQ - 2 Score 0 0 0        08/29/2019   10:00 AM 09/13/2019   10:06 AM 10/20/2021    2:17 PM 04/27/2022    7:50 AM 10/25/2022   10:51 AM  Fall Risk  Falls in the past year?   0 0 0  Was there an injury with Fall?   0 0 0  Fall Risk Category Calculator   0 0 0  Fall Risk Category (Retired)   Low Low   (RETIRED) Patient Fall Risk Level Moderate fall risk Low fall risk Low fall risk Low fall risk   Patient at Risk for  Falls Due to   No Fall Risks No Fall Risks No Fall Risks  Fall risk Follow up   Falls evaluation completed Falls evaluation completed Falls evaluation completed     ROS CONSTITUTIONAL: Negative for chills, fatigue, fever, unintentional weight gain and unintentional weight loss.  E/N/T: Negative for ear pain, nasal congestion and sore throat.  CARDIOVASCULAR: Negative for chest pain, dizziness, palpitations and pedal edema.  RESPIRATORY: Negative for recent cough and dyspnea.  GASTROINTESTINAL: Negative for abdominal pain, acid reflux symptoms, constipation, diarrhea, nausea and vomiting.  MSK: Negative for arthralgias and myalgias.  INTEGUMENTARY: Negative for rash.  NEUROLOGICAL: Negative for dizziness and headaches.  PSYCHIATRIC: Negative for sleep disturbance and to question depression screen.  Negative for depression, negative for anhedonia.    Current Outpatient Medications:    aspirin EC (EQ ASPIRIN ADULT LOW DOSE) 81 MG tablet, Take 1 tablet (81 mg total) by mouth daily., Disp: 90 tablet, Rfl: 0   B-D 3CC LUER-LOK SYR 23GX1" 23G X 1" 3 ML MISC, USE 1 SYRINGE EVERY 14 DAYS INTRAMUSCULARLY AS DIRECTED, Disp: 2 each, Rfl: 0   Calcium Carbonate-Vitamin D (CALCIUM 600/VITAMIN D PO), Take 1 tablet by mouth daily., Disp: , Rfl:    OVER THE COUNTER  MEDICATION, Take 1 tablet by mouth daily. Allergy Medicine, Disp: , Rfl:    carvedilol (COREG) 12.5 MG tablet, Take 1 tablet (12.5 mg total) by mouth 2 (two) times daily., Disp: 180 tablet, Rfl: 1   furosemide (LASIX) 20 MG tablet, Take 1 tablet (20 mg total) by mouth 3 (three) times a week., Disp: 60 tablet, Rfl: 1   omeprazole (PRILOSEC) 20 MG capsule, Take 1 capsule (20 mg total) by mouth daily., Disp: 90 capsule, Rfl: 1   rosuvastatin (CRESTOR) 20 MG tablet, Take 1 tablet (20 mg total) by mouth daily., Disp: 90 tablet, Rfl: 1   spironolactone (ALDACTONE) 25 MG tablet, Take 1 tablet (25 mg total) by mouth daily., Disp: 90 tablet, Rfl: 1    telmisartan (MICARDIS) 40 MG tablet, Take 1 tablet (40 mg total) by mouth daily., Disp: 90 tablet, Rfl: 1   testosterone cypionate (DEPO-TESTOSTERONE) 100 MG/ML injection, Inject 1 mL (100 mg total) into the muscle every 14 (fourteen) days. 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   SVG to OM, SVG to RCA, EVH via bilateral thighs   Swelling of lower leg    Objective:  PHYSICAL EXAM:   BP 122/86 (BP Location: Left Arm, Patient Position: Sitting, Cuff Size: Large)   Pulse 65   Temp (!) 97.2 F (36.2 C) (Temporal)   Ht 6\' 1"  (1.854 m)   Wt (!) 327 lb 9.6 oz (148.6 kg)   SpO2 96%   BMI 43.22 kg/m    GEN: Well nourished, well developed, in no acute distress  Neck: no JVD or masses - no thyromegaly Cardiac: RRR; no murmurs, rubs, or gallops,no edema - Respiratory:  normal respiratory rate and pattern with no distress - normal breath sounds with no rales, rhonchi, wheezes or rubs GI: normal bowel sounds, no masses or tenderness MS: no deformity or atrophy  Skin: warm and dry, no rash  Psych: euthymic mood, appropriate affect and demeanor  Assessment & Plan:    Low testosterone level in male -     PSA -     Testosterone,Free and Total -     Testosterone Cypionate; Inject 1 mL (100 mg total) into the muscle every 14 (fourteen) days. For IM use only  Dispense: 10 mL; Refill: 0  Benign hypertension -     CBC with Differential/Platelet -     Comprehensive metabolic panel -     TSH  Mixed hyperlipidemia -     Lipid panel  Prediabetes -     Hemoglobin A1c  Coronary artery disease involving  native coronary artery of native heart without angina pectoris -     Carvedilol; Take 1 tablet (12.5 mg total) by mouth 2 (two) times daily.  Dispense: 180 tablet; Refill: 1 -     Spironolactone; Take 1 tablet (25 mg total) by mouth daily.  Dispense: 90 tablet; Refill: 1 -     Telmisartan; Take 1 tablet (40 mg total) by mouth daily.  Dispense: 90 tablet; Refill: 1 -  Rosuvastatin Calcium; Take 1 tablet (20 mg total) by mouth daily.  Dispense: 90 tablet; Refill: 1  Gastroesophageal reflux disease, unspecified whether esophagitis present -     Omeprazole; Take 1 capsule (20 mg total) by mouth daily.  Dispense: 90 capsule; Refill: 1  Encounter for screening for malignant neoplasm of prostate -     PSA  Colon cancer screening -     Cologuard  Male hypogonadism -     Testosterone Cypionate; Inject 1 mL (100 mg total) into the muscle every 14 (fourteen) days. For IM use only  Dispense: 10 mL; Refill: 0  S/P aortic valve replacement with bioprosthetic valve + CABG x2 -     Furosemide; Take 1 tablet (20 mg total) by mouth 3 (three) times a week.  Dispense: 60 tablet; Refill: 1     Follow-up: Return in about 4 months (around 02/24/2023) for chronic fasting follow-up.  An After Visit Summary was printed and given to the patient.  Jettie Pagan [facility] [phone]

## 2022-10-29 ENCOUNTER — Other Ambulatory Visit: Payer: Self-pay | Admitting: Physician Assistant

## 2022-10-29 DIAGNOSIS — R899 Unspecified abnormal finding in specimens from other organs, systems and tissues: Secondary | ICD-10-CM

## 2022-10-29 LAB — CBC WITH DIFFERENTIAL/PLATELET
Basophils Absolute: 0.1 10*3/uL (ref 0.0–0.2)
Basos: 1 %
EOS (ABSOLUTE): 0.1 10*3/uL (ref 0.0–0.4)
Eos: 1 %
Hematocrit: 50.9 % (ref 37.5–51.0)
Hemoglobin: 16.8 g/dL (ref 13.0–17.7)
Immature Grans (Abs): 0 10*3/uL (ref 0.0–0.1)
Immature Granulocytes: 0 %
Lymphocytes Absolute: 1.8 10*3/uL (ref 0.7–3.1)
Lymphs: 18 %
MCH: 29.6 pg (ref 26.6–33.0)
MCHC: 33 g/dL (ref 31.5–35.7)
MCV: 90 fL (ref 79–97)
Monocytes Absolute: 0.9 10*3/uL (ref 0.1–0.9)
Monocytes: 9 %
Neutrophils Absolute: 7.1 10*3/uL — ABNORMAL HIGH (ref 1.4–7.0)
Neutrophils: 71 %
Platelets: 233 10*3/uL (ref 150–450)
RBC: 5.68 x10E6/uL (ref 4.14–5.80)
RDW: 12.4 % (ref 11.6–15.4)
WBC: 10.1 10*3/uL (ref 3.4–10.8)

## 2022-10-29 LAB — COMPREHENSIVE METABOLIC PANEL
ALT: 20 IU/L (ref 0–44)
AST: 21 IU/L (ref 0–40)
Albumin/Globulin Ratio: 1.7
Albumin: 4.7 g/dL (ref 3.8–4.9)
Alkaline Phosphatase: 61 IU/L (ref 44–121)
BUN/Creatinine Ratio: 13 (ref 9–20)
BUN: 14 mg/dL (ref 6–24)
Bilirubin Total: 1.1 mg/dL (ref 0.0–1.2)
CO2: 23 mmol/L (ref 20–29)
Calcium: 10.5 mg/dL — ABNORMAL HIGH (ref 8.7–10.2)
Chloride: 95 mmol/L — ABNORMAL LOW (ref 96–106)
Creatinine, Ser: 1.11 mg/dL (ref 0.76–1.27)
Globulin, Total: 2.8 g/dL (ref 1.5–4.5)
Glucose: 121 mg/dL — ABNORMAL HIGH (ref 70–99)
Potassium: 5.7 mmol/L — ABNORMAL HIGH (ref 3.5–5.2)
Sodium: 136 mmol/L (ref 134–144)
Total Protein: 7.5 g/dL (ref 6.0–8.5)
eGFR: 78 mL/min/{1.73_m2} (ref >59)

## 2022-10-29 LAB — LIPID PANEL
Chol/HDL Ratio: 2.8 ratio (ref 0.0–5.0)
Cholesterol, Total: 113 mg/dL (ref 100–199)
HDL: 40 mg/dL (ref >39)
LDL Chol Calc (NIH): 55 mg/dL (ref 0–99)
Triglycerides: 92 mg/dL (ref 0–149)
VLDL Cholesterol Cal: 18 mg/dL (ref 5–40)

## 2022-10-29 LAB — PSA: Prostate Specific Ag, Serum: 0.9 ng/mL (ref 0.0–4.0)

## 2022-10-29 LAB — TSH: TSH: 1.17 u[IU]/mL (ref 0.450–4.500)

## 2022-10-29 LAB — HEMOGLOBIN A1C
Est. average glucose Bld gHb Est-mCnc: 131 mg/dL
Hgb A1c MFr Bld: 6.2 % — ABNORMAL HIGH (ref 4.8–5.6)

## 2022-10-29 LAB — TESTOSTERONE,FREE AND TOTAL
Testosterone, Free: 5.1 pg/mL — ABNORMAL LOW (ref 7.2–24.0)
Testosterone: 600 ng/dL (ref 264–916)

## 2022-11-03 ENCOUNTER — Ambulatory Visit: Payer: 59

## 2022-11-03 DIAGNOSIS — R899 Unspecified abnormal finding in specimens from other organs, systems and tissues: Secondary | ICD-10-CM

## 2022-11-04 LAB — COMPREHENSIVE METABOLIC PANEL
ALT: 17 IU/L (ref 0–44)
AST: 16 IU/L (ref 0–40)
Albumin: 4.3 g/dL (ref 3.8–4.9)
Alkaline Phosphatase: 58 IU/L (ref 44–121)
BUN/Creatinine Ratio: 12 (ref 9–20)
BUN: 14 mg/dL (ref 6–24)
Bilirubin Total: 0.7 mg/dL (ref 0.0–1.2)
CO2: 26 mmol/L (ref 20–29)
Calcium: 9.8 mg/dL (ref 8.7–10.2)
Chloride: 97 mmol/L (ref 96–106)
Creatinine, Ser: 1.18 mg/dL (ref 0.76–1.27)
Globulin, Total: 2.8 g/dL (ref 1.5–4.5)
Glucose: 86 mg/dL (ref 70–99)
Potassium: 5.4 mmol/L — ABNORMAL HIGH (ref 3.5–5.2)
Sodium: 136 mmol/L (ref 134–144)
Total Protein: 7.1 g/dL (ref 6.0–8.5)
eGFR: 73 mL/min/{1.73_m2} (ref >59)

## 2022-12-19 ENCOUNTER — Other Ambulatory Visit: Payer: Self-pay | Admitting: Family Medicine

## 2022-12-22 ENCOUNTER — Other Ambulatory Visit: Payer: Self-pay | Admitting: Physician Assistant

## 2022-12-23 MED ORDER — "BD LUER-LOK SYRINGE 23G X 1"" 3 ML MISC": 1.0000 | 5 refills | Status: DC

## 2023-01-06 ENCOUNTER — Telehealth: Payer: Self-pay | Admitting: Cardiology

## 2023-01-06 NOTE — Telephone Encounter (Signed)
   Pre-operative Risk Assessment    Patient Name: Antonio Riley  DOB: 1965/07/23 MRN: [id number]      Request for Surgical Clearance    Procedure:  Cleaning fo patient's teeth  Date of Surgery:  Today- cleaning                                Surgeon:  Dr Particia Nearing, DDS   Phone number:  [phone] Fax number:     Type of Clearance Requested:   - Medical - does pt need to be premedicated for a cleaning- he is at the officei   Type of Anesthesia:  None    Additional requests/questions:    Signed, Laurence Ferrari   01/06/2023, 11:59 AM

## 2023-01-06 NOTE — Telephone Encounter (Signed)
   Patient Name: Antonio Riley  DOB: 1965/11/26 MRN: [id number]  Primary Cardiologist: Norman Herrlich, MD  Chart reviewed as part of pre-operative protocol coverage.    SBE prophylaxis is required for the patient for life from a cardiac standpoint due to history of aortic valve replacement.  -Patient will need 2 g of amoxicillin to be taken 45 minutes prior to any dental procedures.  I will route this recommendation to the requesting party via Epic fax function and remove from pre-op pool.  Please call with questions.  Napoleon Form, Leodis Rains, NP 01/06/2023, 12:06 PM

## 2023-01-24 ENCOUNTER — Ambulatory Visit: Payer: 59 | Admitting: Podiatry

## 2023-01-24 ENCOUNTER — Ambulatory Visit (INDEPENDENT_AMBULATORY_CARE_PROVIDER_SITE_OTHER): Payer: 59

## 2023-01-24 DIAGNOSIS — M7751 Other enthesopathy of right foot: Secondary | ICD-10-CM | POA: Diagnosis not present

## 2023-01-24 DIAGNOSIS — M778 Other enthesopathies, not elsewhere classified: Secondary | ICD-10-CM

## 2023-01-24 DIAGNOSIS — M722 Plantar fascial fibromatosis: Secondary | ICD-10-CM

## 2023-01-24 MED ORDER — METHYLPREDNISOLONE 4 MG PO TBPK: ORAL_TABLET | ORAL | 0 refills | Status: DC

## 2023-01-24 MED ORDER — MELOXICAM 15 MG PO TABS
15.0000 mg | ORAL_TABLET | Freq: Every day | ORAL | 0 refills | Status: DC
Start: 2023-01-24 — End: 2023-03-17

## 2023-01-24 NOTE — Progress Notes (Signed)
  Subjective:  Patient ID: Antonio Riley, male    DOB: 04/07/1966,  MRN: [id number]  Chief Complaint  Patient presents with   Foot Pain    Bruised right heel July 2024 after stepping into a pebble. Pain is worst when applying pressure. Not diabetic.     57 y.o. male presents with the above complaint.  Patient presents for pain in the right heel.  He says he has a lot of pain in the bottom of the right heel he did step on a small object like a pebble and it caused a lot of pain to occur in his right heel.  Feels pain worse after sitting or getting out of bed.   Review of Systems: Negative except as noted in the HPI. Denies N/V/F/Ch.   Objective:  There were no vitals filed for this visit. There is no height or weight on file to calculate BMI. Constitutional Well developed. Well nourished.  Vascular Dorsalis pedis pulses palpable bilaterally. Posterior tibial pulses palpable bilaterally. Capillary refill normal to all digits.  No cyanosis or clubbing noted. Pedal hair growth normal.  Neurologic Normal speech. Oriented to person, place, and time. Epicritic sensation to light touch grossly present bilaterally.  Dermatologic Nails well groomed and normal in appearance. No open wounds. No skin lesions.  Orthopedic: Normal joint ROM without pain or crepitus bilaterally. No visible deformities. Tender to palpation at the calcaneal tuber and central plantar aspect of the calcaneus right. No pain with calcaneal squeeze right. Ankle ROM diminished range of motion right. Silfverskiold Test: negative right.   Radiographs: Taken and reviewed. No acute fractures or dislocations. No evidence of stress fracture.  Plantar heel spur absent. Posterior heel spur absent.   Assessment:   1. Plantar fasciitis of right foot   2. Calcaneal bursitis (heel), right   3. Capsulitis of foot, right    Plan:  Patient was evaluated and treated and all questions answered.  Plantar Fasciitis versus  plantar heel bursitis of the right foot, right - XR reviewed as above.  - Educated on icing and stretching. Instructions given.  - Injection delivered to the plantar fascia as below. - DME: Recommend gel heel cup for right foot - Pharmacologic management: Meloxicam 15 mg take once a day for 30 days as well as methylprednisone 4 mg steroid taper pack take as directed for 6 days. Educated on risks/benefits and proper taking of medication.  Procedure: Injection Tendon/Ligament Location: Right plantar fascia at the glabrous junction; medial approach. Skin Prep: alcohol Injectate: 1 cc 0.5% marcaine plain, 1 cc kenalog 10. Disposition: Patient tolerated procedure well. Injection site dressed with a band-aid.  Return in about 4 weeks (around 02/21/2023) for f/u R heel pain. To her home

## 2023-01-24 NOTE — Patient Instructions (Signed)

## 2023-02-08 ENCOUNTER — Telehealth: Payer: Self-pay

## 2023-02-08 NOTE — Telephone Encounter (Signed)
I left a message on the number(s) listed in the patients chart requesting the patient to call back regarding the upcomming appointment for 03/03/2023. The provider is out of the office that day. The appointment has been canceled. Waiting for the patient to return the call.  NOTE: If the patient does not call back within a week to reschedule this appointment, the front staff will mail the patient a letter requesting to call the office back.

## 2023-02-11 ENCOUNTER — Ambulatory Visit: Payer: 59 | Admitting: Physician Assistant

## 2023-02-15 ENCOUNTER — Encounter: Payer: Self-pay | Admitting: Physician Assistant

## 2023-02-15 NOTE — Telephone Encounter (Signed)
I have mailed the patient a letter requesting him to call the office.

## 2023-02-22 ENCOUNTER — Ambulatory Visit: Payer: 59 | Admitting: Podiatry

## 2023-02-28 ENCOUNTER — Ambulatory Visit: Payer: 59 | Admitting: Physician Assistant

## 2023-03-03 ENCOUNTER — Ambulatory Visit: Payer: 59 | Admitting: Physician Assistant

## 2023-03-17 ENCOUNTER — Encounter: Payer: Self-pay | Admitting: Physician Assistant

## 2023-03-17 ENCOUNTER — Ambulatory Visit: Payer: 59 | Admitting: Physician Assistant

## 2023-03-17 VITALS — BP 122/78 | HR 62 | Temp 97.6°F | Resp 16 | Ht 73.0 in | Wt 313.0 lb

## 2023-03-17 DIAGNOSIS — K219 Gastro-esophageal reflux disease without esophagitis: Secondary | ICD-10-CM

## 2023-03-17 DIAGNOSIS — I2510 Atherosclerotic heart disease of native coronary artery without angina pectoris: Secondary | ICD-10-CM

## 2023-03-17 DIAGNOSIS — E291 Testicular hypofunction: Secondary | ICD-10-CM

## 2023-03-17 DIAGNOSIS — R7989 Other specified abnormal findings of blood chemistry: Secondary | ICD-10-CM

## 2023-03-17 DIAGNOSIS — R7303 Prediabetes: Secondary | ICD-10-CM

## 2023-03-17 DIAGNOSIS — E782 Mixed hyperlipidemia: Secondary | ICD-10-CM

## 2023-03-17 DIAGNOSIS — I10 Essential (primary) hypertension: Secondary | ICD-10-CM

## 2023-03-17 DIAGNOSIS — Z953 Presence of xenogenic heart valve: Secondary | ICD-10-CM

## 2023-03-17 HISTORY — DX: Other specified abnormal findings of blood chemistry: R79.89

## 2023-03-17 MED ORDER — FUROSEMIDE 20 MG PO TABS: 20.0000 mg | ORAL_TABLET | ORAL | 1 refills | Status: DC

## 2023-03-17 MED ORDER — CARVEDILOL 12.5 MG PO TABS: 12.5000 mg | ORAL_TABLET | Freq: Two times a day (BID) | ORAL | 1 refills | Status: DC

## 2023-03-17 MED ORDER — SPIRONOLACTONE 25 MG PO TABS: 25.0000 mg | ORAL_TABLET | Freq: Every day | ORAL | 1 refills | Status: DC

## 2023-03-17 MED ORDER — TELMISARTAN 40 MG PO TABS: 40.0000 mg | ORAL_TABLET | Freq: Every day | ORAL | 1 refills | Status: DC

## 2023-03-17 MED ORDER — OMEPRAZOLE 20 MG PO CPDR: 20.0000 mg | DELAYED_RELEASE_CAPSULE | Freq: Every day | ORAL | 1 refills | Status: DC

## 2023-03-17 MED ORDER — TESTOSTERONE CYPIONATE 100 MG/ML IM SOLN: 100.0000 mg | INTRAMUSCULAR | 0 refills | Status: DC

## 2023-03-17 MED ORDER — ROSUVASTATIN CALCIUM 20 MG PO TABS: 20.0000 mg | ORAL_TABLET | Freq: Every day | ORAL | 1 refills | Status: DC

## 2023-03-17 NOTE — Progress Notes (Signed)
Subjective:  Patient ID: Antonio Riley, male    DOB: 03/05/1966  Age: 57 y.o. MRN: [id number]  Chief Complaint  Patient presents with   Medical Management of Chronic Issues    HPI Pt presents for follow up of hypertension. The patient is tolerating the medication well without side effects. Compliance with treatment has been good; including taking medication as directed , maintains a healthy diet and regular exercise regimen , and following up as directed. Pt currently taking coreg 12.5mg  bid , micardis 40mg , lasix 20mg  and spironolactone 25mg   Pt with history of CAD and has had aortic valve replacement.  He had that done along with double bypass graft in 2021.  He has seen Dr Dulce Sellar in the past but has not seen in over a year - overdue for follow up  Mixed hyperlipidemia  Pt presents with hyperlipidemia. Compliance with treatment has been good The patient is compliant with medications, maintains a low cholesterol diet , follows up as directed , and maintains an exercise regimen . The patient denies experiencing any hypercholesterolemia related symptoms. Currently taking crestor 20mg  qd  Pt with history of GERD - stable on prilosec 20mg  qd  Pt is due for colonoscopy but refuses - he has had cologuard ordered but has not done it  Pt with history of low testosterone.  He is currently on 100mg  every other week - due to check labwork       10/25/2022   10:51 AM 04/27/2022    7:50 AM 10/20/2021    2:22 PM  Depression screen PHQ 2/9  Decreased Interest 0 0 0  Down, Depressed, Hopeless 0 0 0  PHQ - 2 Score 0 0 0        09/13/2019   10:06 AM 10/20/2021    2:17 PM 04/27/2022    7:50 AM 10/25/2022   10:51 AM 03/17/2023    3:09 PM  Fall Risk  Falls in the past year?  0 0 0 0  Was there an injury with Fall?  0 0 0 0  Fall Risk Category Calculator  0 0 0 0  Fall Risk Category (Retired)  Low Low    (RETIRED) Patient Fall Risk Level Low fall risk Low fall risk Low fall risk    Patient  at Risk for Falls Due to  No Fall Risks No Fall Risks No Fall Risks No Fall Risks  Fall risk Follow up  Falls evaluation completed Falls evaluation completed Falls evaluation completed Falls evaluation completed    CONSTITUTIONAL: Negative for chills, fatigue, fever, unintentional weight gain and unintentional weight loss.  E/N/T: Negative for ear pain, nasal congestion and sore throat.  CARDIOVASCULAR: Negative for chest pain, dizziness, palpitations and pedal edema.  RESPIRATORY: Negative for recent cough and dyspnea.  GASTROINTESTINAL: Negative for abdominal pain, acid reflux symptoms, constipation, diarrhea, nausea and vomiting.  MSK: Negative for arthralgias and myalgias.  INTEGUMENTARY: Negative for rash.  NEUROLOGICAL: Negative for dizziness and headaches.  PSYCHIATRIC: Negative for sleep disturbance and to question depression screen.  Negative for depression, negative for anhedonia.       Current Outpatient Medications:    B-D 3CC LUER-LOK SYR 23GX1" 23G X 1" 3 ML MISC, Inject 1 each into the muscle every 14 (fourteen) days., Disp: 2 each, Rfl: 5   Calcium Carbonate-Vitamin D (CALCIUM 600/VITAMIN D PO), Take 1 tablet by mouth daily., Disp: , Rfl:    EQ ASPIRIN ADULT LOW DOSE 81 MG tablet, TAKE 1  BY MOUTH ONCE  DAILY, Disp: 90 tablet, Rfl: 0   carvedilol (COREG) 12.5 MG tablet, Take 1 tablet (12.5 mg total) by mouth 2 (two) times daily., Disp: 180 tablet, Rfl: 1   [START ON 03/18/2023] furosemide (LASIX) 20 MG tablet, Take 1 tablet (20 mg total) by mouth 3 (three) times a week., Disp: 60 tablet, Rfl: 1   omeprazole (PRILOSEC) 20 MG capsule, Take 1 capsule (20 mg total) by mouth daily., Disp: 90 capsule, Rfl: 1   rosuvastatin (CRESTOR) 20 MG tablet, Take 1 tablet (20 mg total) by mouth daily., Disp: 90 tablet, Rfl: 1   spironolactone (ALDACTONE) 25 MG tablet, Take 1 tablet (25 mg total) by mouth daily., Disp: 90 tablet, Rfl: 1   telmisartan (MICARDIS) 40 MG tablet, Take 1 tablet (40 mg  total) by mouth daily., Disp: 90 tablet, Rfl: 1   testosterone cypionate (DEPO-TESTOSTERONE) 100 MG/ML injection, Inject 1 mL (100 mg total) into the muscle every 14 (fourteen) days. 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   SVG to OM, SVG to RCA, EVH via bilateral thighs   Swelling of lower leg    Objective:  PHYSICAL EXAM:   VS: BP 122/78 (BP Location: Left Arm, Patient Position: Sitting, Cuff Size: Large)   Pulse 62   Temp 97.6 F (36.4 C) (Temporal)   Resp 16   Ht 6\' 1"  (1.854 m)   Wt (!) 313 lb (142 kg)   SpO2 97%   BMI 41.30 kg/m   GEN: Well nourished, well developed, in no acute distress  Cardiac: RRR; no murmurs, rubs, or gallops,no edema -  Respiratory:  normal respiratory rate and pattern with no distress - normal breath sounds with no rales, rhonchi, wheezes or rubs  MS: no deformity or atrophy  Skin: warm and dry, no rash  Neuro:  Alert and Oriented x 3, - CN II-Xii grossly intact Psych: euthymic mood, appropriate affect and demeanor   Assessment & Plan:    Benign hypertension -     CBC with Differential/Platelet -     Comprehensive metabolic panel -     Lipid panel Continue meds Mixed hyperlipidemia -     CBC with Differential/Platelet -     Comprehensive metabolic panel -     Lipid panel Continue med Prediabetes -     Hemoglobin A1c Watch diet - decrease carbs/sugar Low testosterone level in male -     Testosterone,Free and Total -     Testosterone Cypionate; Inject 1 mL (100 mg total) into the muscle every 14 (fourteen) days. For IM use only  Dispense: 10 mL;  Refill: 0  Coronary artery disease involving native coronary artery of native heart without angina pectoris -     Carvedilol; Take 1 tablet (12.5 mg total) by mouth 2 (two) times daily.  Dispense: 180 tablet; Refill: 1 -     Rosuvastatin Calcium; Take 1 tablet (20 mg total) by mouth daily.  Dispense: 90 tablet; Refill: 1 -     Spironolactone; Take 1 tablet (25 mg total)  by mouth daily.  Dispense: 90 tablet; Refill: 1 -     Telmisartan; Take 1 tablet (40 mg total) by mouth daily.  Dispense: 90 tablet; Refill: 1 Appt made with Dr Dulce Sellar for 11/25 at 8:20am S/P aortic valve replacement with bioprosthetic valve + CABG x2 -     Furosemide; Take 1 tablet (20 mg total) by mouth 3 (three) times a week.  Dispense: 60 tablet; Refill: 1  Gastroesophageal reflux disease, unspecified whether esophagitis present -     Omeprazole; Take 1 capsule (20 mg total) by mouth daily.  Dispense: 90 capsule; Refill: 1  Male hypogonadism -     Testosterone Cypionate; Inject 1 mL (100 mg total) into the muscle every 14 (fourteen) days. For IM use only  Dispense: 10 mL; Refill: 0     Follow-up: Return in about 6 months (around 09/14/2023) for chronic fasting follow-up.  An After Visit Summary was printed and given to the patient.  Jettie Pagan [facility] [phone]

## 2023-03-20 LAB — CBC WITH DIFFERENTIAL/PLATELET
Basophils Absolute: 0.1 10*3/uL (ref 0.0–0.2)
Basos: 1 %
EOS (ABSOLUTE): 0.2 10*3/uL (ref 0.0–0.4)
Eos: 2 %
Hematocrit: 47.3 % (ref 37.5–51.0)
Hemoglobin: 15.2 g/dL (ref 13.0–17.7)
Immature Grans (Abs): 0 10*3/uL (ref 0.0–0.1)
Immature Granulocytes: 0 %
Lymphocytes Absolute: 2.5 10*3/uL (ref 0.7–3.1)
Lymphs: 28 %
MCH: 30.1 pg (ref 26.6–33.0)
MCHC: 32.1 g/dL (ref 31.5–35.7)
MCV: 94 fL (ref 79–97)
Monocytes Absolute: 1 10*3/uL — ABNORMAL HIGH (ref 0.1–0.9)
Monocytes: 11 %
Neutrophils Absolute: 5.1 10*3/uL (ref 1.4–7.0)
Neutrophils: 58 %
Platelets: 222 10*3/uL (ref 150–450)
RBC: 5.05 x10E6/uL (ref 4.14–5.80)
RDW: 12.2 % (ref 11.6–15.4)
WBC: 8.8 10*3/uL (ref 3.4–10.8)

## 2023-03-20 LAB — LIPID PANEL
Chol/HDL Ratio: 2.9 ratio (ref 0.0–5.0)
Cholesterol, Total: 112 mg/dL (ref 100–199)
HDL: 38 mg/dL — ABNORMAL LOW (ref >39)
LDL Chol Calc (NIH): 57 mg/dL (ref 0–99)
Triglycerides: 88 mg/dL (ref 0–149)
VLDL Cholesterol Cal: 17 mg/dL (ref 5–40)

## 2023-03-20 LAB — COMPREHENSIVE METABOLIC PANEL
ALT: 16 [IU]/L (ref 0–44)
AST: 17 [IU]/L (ref 0–40)
Albumin: 4.4 g/dL (ref 3.8–4.9)
Alkaline Phosphatase: 56 [IU]/L (ref 44–121)
BUN/Creatinine Ratio: 11 (ref 9–20)
BUN: 12 mg/dL (ref 6–24)
Bilirubin Total: 1.3 mg/dL — ABNORMAL HIGH (ref 0.0–1.2)
CO2: 25 mmol/L (ref 20–29)
Calcium: 9.7 mg/dL (ref 8.7–10.2)
Chloride: 96 mmol/L (ref 96–106)
Creatinine, Ser: 1.11 mg/dL (ref 0.76–1.27)
Globulin, Total: 2.9 g/dL (ref 1.5–4.5)
Glucose: 86 mg/dL (ref 70–99)
Potassium: 5.4 mmol/L — ABNORMAL HIGH (ref 3.5–5.2)
Sodium: 135 mmol/L (ref 134–144)
Total Protein: 7.3 g/dL (ref 6.0–8.5)
eGFR: 78 mL/min/{1.73_m2} (ref >59)

## 2023-03-20 LAB — TESTOSTERONE,FREE AND TOTAL
Testosterone, Free: 23.6 pg/mL (ref 7.2–24.0)
Testosterone: >1500 ng/dL — ABNORMAL HIGH (ref 264–916)

## 2023-03-20 LAB — HEMOGLOBIN A1C
Est. average glucose Bld gHb Est-mCnc: 128 mg/dL
Hgb A1c MFr Bld: 6.1 % — ABNORMAL HIGH (ref 4.8–5.6)

## 2023-03-21 ENCOUNTER — Other Ambulatory Visit: Payer: Self-pay | Admitting: Physician Assistant

## 2023-03-21 DIAGNOSIS — E291 Testicular hypofunction: Secondary | ICD-10-CM

## 2023-03-21 DIAGNOSIS — R7989 Other specified abnormal findings of blood chemistry: Secondary | ICD-10-CM

## 2023-03-21 MED ORDER — TESTOSTERONE CYPIONATE 100 MG/ML IM SOLN: 100.0000 mg | INTRAMUSCULAR | Status: DC

## 2023-03-26 ENCOUNTER — Other Ambulatory Visit: Payer: Self-pay | Admitting: Family Medicine

## 2023-04-09 NOTE — Progress Notes (Unsigned)
Cardiology Office Note:    Date:  04/11/2023   ID:  Antonio Riley, DOB February 15, 1966, MRN [id number]  PCP:  Marianne Sofia, PA-C  Cardiologist:  Norman Herrlich, MD    Referring MD: Marianne Sofia, PA-C    ASSESSMENT:    1. S/P aortic valve replacement with bioprosthetic valve   2. Coronary artery disease involving native coronary artery of native heart without angina pectoris   3. S/P CABG x 2   4. Resistant hypertension   5. Pure hypercholesterolemia   6. Elevated lipoprotein A level    PLAN:    In order of problems listed above:  Doing well he will need a follow-up echocardiogram 2026 no findings to suggest valve dysfunction Continue endocarditis prophylaxis  Stable CAD no anginal discomfort continue his high intensity statin beta-blocker Now well-controlled continue his current antihypertensive regimen including loop diuretic MRA ARB and beta-blocker normal renal function potassium Safe to use Cialis   Next appointment: 1 year   Medication Adjustments/Labs and Tests Ordered: Current medicines are reviewed at length with the patient today.  Concerns regarding medicines are outlined above.  Orders Placed This Encounter  Procedures   EKG 12-Lead   No orders of the defined types were placed in this encounter.    History of Present Illness:    Antonio Riley is a 57 y.o. male with a hx of severe symptomatic aortic stenosis with surgical aortic valve replacement bovine pericardial tissue valve and coronary bypass surgery 08/24/2019 postoperative atrial fibrillation resistant hypertension and hyperlipidemia with elevated LPa last seen 11/27/2022.  Compliance with diet, lifestyle and medications: Yes  His primary concern is whether it is safe to take Cialis the answer is yes and I will send a secure chat to his PCP He is trying to take care of himself he tolerates his statin he is lost more than 10 pounds and he is close to stopping smoking He feels well and is having  no cardiovascular symptoms shortness of breath chest pain palpitation or syncope Send labs show ideal lipid profile 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   SVG to OM, SVG to RCA, EVH via bilateral thighs   Swelling of lower leg     Current Medications: Current Meds  Medication Sig   B-D 3CC LUER-LOK SYR 23GX1" 23G X 1" 3 ML MISC Inject 1 each into the muscle every 14 (fourteen) days.   Calcium Carbonate-Vitamin D (CALCIUM 600/VITAMIN D PO) Take 1 tablet by mouth daily.   carvedilol (COREG) 12.5 MG tablet Take 1 tablet (12.5 mg total) by mouth 2 (two) times daily.   EQ ASPIRIN ADULT LOW DOSE 81 MG tablet Take 1 tablet by mouth once daily   furosemide (LASIX) 20 MG tablet Take 1 tablet (20 mg total) by mouth 3 (three) times a week.   omeprazole (PRILOSEC) 20 MG capsule Take 1 capsule (20 mg total) by mouth daily.   rosuvastatin (CRESTOR) 20 MG tablet Take 1 tablet (20 mg total) by mouth daily.   spironolactone (ALDACTONE) 25 MG tablet Take 1 tablet (25 mg total) by  mouth daily.   telmisartan (MICARDIS) 40 MG tablet Take 1 tablet (40 mg total) by mouth daily.   testosterone cypionate (DEPO-TESTOSTERONE) 100 MG/ML injection Inject 1 mL (100 mg total) into the muscle every 14 (fourteen) days. For IM use only      EKGs/Labs/Other Studies Reviewed:    The following studies were reviewed today:  Cardiac Studies & Procedures   CARDIAC CATHETERIZATION  CARDIAC CATHETERIZATION 07/31/2019  Narrative 1.  Two-vessel coronary artery disease with total occlusion of the right coronary artery, the PDA and PLA branches fill late from right to  right collaterals and faint left-to-right collaterals.  Moderate stenosis of the proximal circumflex beyond the intermediate branch origin 2.  Widely patent left main, LAD, and ramus intermedius with only mild nonobstructive disease 3.  Moderate aortic stenosis with mean gradient 32 mmHg and calculated aortic valve area 1.49 sq cm 4.  Essentially normal right heart hemodynamics with preserved cardiac output  Recommendations: Continued surgical evaluation for AVR/CABG in this symptomatic patient with severe aortic stenosis by echo criteria  Findings Coronary Findings Diagnostic  Dominance: Right  Left Anterior Descending There is mild diffuse disease throughout the vessel. Large vessel, wraps around the LV apex, mild diffuse irregularity with no significant stenosis throughout.  The 1st diagonal branch is relatively small in caliber with no significant stenosis present.  Ramus Intermedius Ramus lesion is 30% stenosed.  Left Circumflex Ost Cx to Prox Cx lesion is 60% stenosed. There is moderate stenosis at the ostium of the circumflex just beyond the intermediate origin.  This vessel supplies a single posterolateral branch of significance and a 2nd very small PL branch.  Right Coronary Artery Prox RCA to Mid RCA lesion is 100% stenosed. Dominant vessel, total occlusion in the midportion at the origin of the 1st RV marginal branch.  The distal RCA and branch vessels are filled late by right to right and left to right collaterals.  The PDA and PL branches are not well visualized via either collateral system.  Right Posterior Descending Artery Collaterals RPDA filled by collaterals from RV Branch.  Second Right Posterolateral Branch Collaterals 2nd RPL filled by collaterals from RV Branch.  Third Right Posterolateral Branch Collaterals 3rd RPL filled by collaterals from 2nd Sept.  Intervention  No interventions have been documented.     ECHOCARDIOGRAM  ECHOCARDIOGRAM COMPLETE  10/25/2019  Narrative ECHOCARDIOGRAM REPORT    Patient Name:   Antonio Riley Date of Exam: 10/25/2019 Medical Rec #:  [id number]           Height:       73.0 in Accession #:    [id number]          Weight:       335.2 lb Date of Birth:  06/25/1965           BSA:          2.680 m Patient Age:    53 years            BP:           174/102 mmHg Patient Gender: M                   HR:           70 bpm. Exam Location:  [place]  Procedure: 2D Echo  Indications:    S/P aortic valve replacement with bioprosthetic valve + CABG x2 [Z95.3 (ICD-10-CM)  History:        Patient has prior history of  Echocardiogram examinations, most recent 06/26/2019. CAD, Prior Cardiac Surgery, Aortic Valve Disease, Signs/Symptoms:Morbid obesity; Risk Factors:Hypertension.  Sonographer:    Louie Boston Referring Phys: [phone] Kamonte Mcmichen J Tammatha Cobb  IMPRESSIONS   1. Left ventricular ejection fraction, by estimation, is 60 to 65%. The left ventricle has normal function. The left ventricle has no regional wall motion abnormalities. There is severe concentric left ventricular hypertrophy. Left ventricular diastolic parameters are consistent with Grade II diastolic dysfunction (pseudonormalization). 2. Right ventricular systolic function is normal. The right ventricular size is normal. There is normal pulmonary artery systolic pressure. 3. Left atrial size was moderately dilated. 4. The mitral valve is normal in structure. Trivial mitral valve regurgitation. No evidence of mitral stenosis. 5. Well seated Inspiris 25 mm bioprosthetic valve with no evidence of stenosis. Aortic valve regurgitation is not visualized. 6. Aneurysm of the ascending aorta, measuring 41 mm. 7. The inferior vena cava is normal in size with greater than 50% respiratory variability, suggesting right atrial pressure of 3 mmHg.  FINDINGS Left Ventricle: Left ventricular ejection fraction, by estimation, is 60 to 65%. The left ventricle has normal  function. The left ventricle has no regional wall motion abnormalities. The left ventricular internal cavity size was normal in size. There is severe concentric left ventricular hypertrophy. Abnormal (paradoxical) septal motion consistent with post-operative status. Left ventricular diastolic parameters are consistent with Grade II diastolic dysfunction (pseudonormalization).  Right Ventricle: The right ventricular size is normal. No increase in right ventricular wall thickness. Right ventricular systolic function is normal. There is normal pulmonary artery systolic pressure. The tricuspid regurgitant velocity is 2.01 m/s, and with an assumed right atrial pressure of 3 mmHg, the estimated right ventricular systolic pressure is 19.2 mmHg.  Left Atrium: Left atrial size was moderately dilated.  Right Atrium: Right atrial size was normal in size.  Pericardium: There is no evidence of pericardial effusion.  Mitral Valve: The mitral valve is normal in structure. Normal mobility of the mitral valve leaflets. Trivial mitral valve regurgitation. No evidence of mitral valve stenosis.  Tricuspid Valve: The tricuspid valve is normal in structure. Tricuspid valve regurgitation is not demonstrated. No evidence of tricuspid stenosis.  Aortic Valve: The aortic valve has been repaired/replaced. Aortic valve regurgitation is not visualized. No aortic stenosis is present. Aortic valve mean gradient measures 15.0 mmHg. Aortic valve peak gradient measures 25.4 mmHg. There is a 25 mm valve present in the aortic position.  Pulmonic Valve: The pulmonic valve was normal in structure. Pulmonic valve regurgitation is not visualized. No evidence of pulmonic stenosis.  Aorta: The aortic root is normal in size and structure. There is an aneurysm involving the ascending aorta. The aneurysm measures 41 mm.  Venous: The inferior vena cava is normal in size with greater than 50% respiratory variability, suggesting right  atrial pressure of 3 mmHg.  IAS/Shunts: No atrial level shunt detected by color flow Doppler.   LEFT VENTRICLE PLAX 2D LVIDd:         5.10 cm Diastology LVIDs:         3.10 cm LV e' lateral:   7.18 cm/s LV PW:         1.70 cm LV E/e' lateral: 14.9 LV IVS:        1.70 cm LV e' medial:    5.55 cm/s LV E/e' medial:  19.3   RIGHT VENTRICLE         IVC TAPSE (M-mode): 2.5 cm  IVC diam: 1.60 cm  LEFT ATRIUM  Index       RIGHT ATRIUM           Index LA diam:        4.80 cm  1.79 cm/m  RA Area:     20.00 cm LA Vol (A2C):   123.0 ml 45.90 ml/m RA Volume:   60.30 ml  22.50 ml/m LA Vol (A4C):   115.0 ml 42.92 ml/m LA Biplane Vol: 125.0 ml 46.65 ml/m AORTIC VALVE AV Vmax:           252.00 cm/s AV Vmean:          183.000 cm/s AV VTI:            0.565 m AV Peak Grad:      25.4 mmHg AV Mean Grad:      15.0 mmHg LVOT Vmax:         99.00 cm/s LVOT Vmean:        68.300 cm/s LVOT VTI:          0.247 m LVOT/AV VTI ratio: 0.44  AORTA Ao Root diam: 3.50 cm Ao Asc diam:  4.05 cm  MITRAL VALVE                TRICUSPID VALVE MV Area (PHT): 2.95 cm     TR Peak grad:   16.2 mmHg MV Decel Time: 257 msec     TR Vmax:        201.00 cm/s MV E velocity: 107.00 cm/s MV A velocity: 99.00 cm/s   SHUNTS MV E/A ratio:  1.08         Systemic VTI: 0.25 m  Lavona Mound Tobb DO Electronically signed by Thomasene Ripple DO Signature Date/Time: 10/25/2019/1:23:06 PM    Final   TEE  ECHO INTRAOPERATIVE TEE 08/24/2019  Narrative *INTRAOPERATIVE TRANSESOPHAGEAL REPORT *    Patient Name:   ZACHERI DALAL Alvizo Date of Exam: 08/24/2019 Medical Rec #:  [id number]           Height:       73.0 in Accession #:    [id number]          Weight:       325.0 lb Date of Birth:  [birth year]           BSA:          2.64 m Patient Age:    53 years            BP:           108/53 mmHg Patient Gender: M                   HR:           59 bpm. Exam Location:  Inpatient  Transesophogeal exam was perform  intraoperatively during surgical procedure. Patient was closely monitored under general anesthesia during the entirety of examination.  Indications:     aortic valve replacement, CABG Performing Phys: [id number] CLARENCE H OWEN Diagnosing Phys: Jairo Ben MD  Complications: No known complications during this procedure. POST-OP IMPRESSIONS - Left Ventricle: The left ventricle is unchanged from pre-bypass. has normal systolic function, with an ejection fraction of 60%. The wall motion is normal. - Aortic Valve: A pericardial bioprosthetic valve was placed, leaflets are freely mobile and function well. No regurgitation post repair. The gradient recorded across the prosthetic valve is within the expected range, peak gradient 15 mmHg, mean gradient 7 mmHg. No perivalvular leak noted. - Mitral Valve: The mitral valve appears unchanged  from pre-bypass. - Tricuspid Valve: The tricuspid valve appears unchanged from pre-bypass.  PRE-OP FINDINGS Left Ventricle: The left ventricle has normal systolic function, with an ejection fraction of approximately 55-60%, calculated 55%. The cavity size was normal. There is no increase in left ventricular wall thickness. No evidence of left ventricular regional wall motion abnormalities.  Right Ventricle: The right ventricle has normal systolic function. The cavity was normal. There is no increase in right ventricular wall thickness. Right ventricular systolic pressure is normal.  Left Atrium: Left atrial size was normal in size. The left atrial appendage is well visualized and there is no evidence of thrombus present. Left atrial appendage velocity is normal at greater than 40 cm/s.   Interatrial Septum: No atrial level shunt detected by color flow Doppler.  Pericardium: There is no evidence of pericardial effusion.  Mitral Valve: The mitral valve is normal in structure. No thickening of the mitral valve leaflet. No calcification of the mitral valve  leaflet. Mitral valve regurgitation is trivial by color flow Doppler. There is no evidence of mitral valve vegetation.  Tricuspid Valve: The tricuspid valve was normal in structure. Tricuspid valve regurgitation was not visualized by color flow Doppler. There is no evidence of tricuspid valve vegetation.  Aortic Valve: The aortic valve is tricuspid, functionally bicuspid with partial calcific fusion of the right and non-coronary cusps. There is moderate thickening and calcification of the aortic valve. Aortic valve regurgitation is moderate by color flow Doppler. The jet is centrally-directed. There is moderate-severe stenosis of the aortic valve, with peak gradient 70 mmHg, mean gradient 43 mmHg. Vmax 419 cm/s. The AVA 1.53 cm2 (VTI), 1.23 cm2 (plainimetry). There is no evidence of a vegetation on the aortic valve.  Pulmonic Valve: The pulmonic valve was normal in structure, with normal leaflet excursion. No evidence of pulmonic stenosis. Pulmonic valve regurgitation is trivial by color flow Doppler.   Aorta: The aortic root, ascending aorta and aortic arch are normal in size and structure. There is evidence of atheroma plaque in the descending aorta; Grade I, measuring <1-48mm in size.  Pulmonary Artery: The pulmonary artery is of normal size.  Venous: The inferior vena cava is normal in size with greater than 50% respiratory variability, suggesting right atrial pressure of 3 mmHg.  +-------------+---------++ AORTIC VALVE           +-------------+---------++ AV Mean Grad:43.0 mmHg +-------------+---------++  +-------------+--------++ MITRAL VALVE          +-------------+--------++ MV Mean grad:1.0 mmHg +-------------+--------++   Jairo Ben MD Electronically signed by Jairo Ben MD Signature Date/Time: 08/24/2019/6:12:13 PM    Final    CT SCANS  CT CORONARY MORPH W/CTA COR W/SCORE 08/08/2019  Addendum 08/12/2019  8:07 PM ADDENDUM REPORT: 08/12/2019  20:05  CLINICAL DATA:  Aortic stenosis  EXAM: Cardiac TAVR CT  TECHNIQUE: The patient was scanned on a Siemens Force 192 slice scanner. A 120 kV retrospective scan was triggered in the descending thoracic aorta at 111 HU's. Gantry rotation speed was 270 msecs and collimation was .9 mm. No beta blockade or nitro were given. The 3D data set was reconstructed in 5% intervals of the R-R cycle. Systolic and diastolic phases were analyzed on a dedicated work station using MPR, MIP and VRT modes. The patient received OMNIPAQUE IOHEXOL 350 MG/ML SOLN of contrast.  FINDINGS: Aortic Valve: Trileaflet aortic valve. Moderately reduced cusp separation. Severely thickened, severely calcified aortic valve cusps.  AV calcium score: 2305  Virtual Basal Annulus Measurements:  Maximum/Minimum Diameter:  30.7 x 25.8 mm  Perimeter: 86.9 mm  Area: 577 mm  LVOT calcification posteriorly.  Based on these measurements, the annulus would be suitable for a 29 mm Sapien 3 valve.  Sinus of Valsalva Measurements:  Non-coronary:  34 mm  Right - coronary:  33 mm  Left - coronary:  36 mm  Sinus of Valsalva Height:  Left: 21.7 mm  Right: 19.5 mm  Aorta: Conventional 3 vessel branch pattern of the aortic arch. No significant aorta calcifications.  Sinotubular Junction:  30 mm  Ascending Thoracic Aorta:  36 mm  Aortic Arch:  26 mm  Descending Thoracic Aorta:  23 mm  Coronary Artery Height above Annulus:  Left Main: 15.2 mm  Right Coronary: 16 mm  Coronary Arteries: 3 vessel coronary artery calcifications.  Optimum Fluoroscopic Angle for Delivery: RAO 1, CRA 1  No left atrial appendage thrombus.  IMPRESSION: 1. Trileaflet aortic valve. Moderately reduced cusp separation. Severely thickened, severely calcified aortic valve cusps.  2.  AV calcium score: 2305  3. Annulus area 577 mm, suitable for a 29 mm Sapien 3 valve. Posterior LVOT calcification.  4.  Normal caliber  thoracic aorta, no calcifications.  5.  Adequate coronary heights from the annulus.  6. Optimum Fluoroscopic Angle for Delivery: RAO 1, CRA 1   Electronically Signed By: Weston Brass On: 08/12/2019 20:05  Narrative EXAM: OVER-READ INTERPRETATION  CT CHEST  The following report is an over-read performed by radiologist Dr. Cleone Slim of [facility] on 08/08/2019. This over-read does not include interpretation of cardiac or coronary anatomy or pathology. The coronary CTA interpretation by the cardiologist is attached.  COMPARISON:  None.  FINDINGS: Please see the separate concurrent chest CT angiogram report for details.  IMPRESSION: Please see the separate concurrent chest CT angiogram report for details.  Electronically Signed: By: Delbert Phenix M.D. On: 08/08/2019 14:57          EKG Interpretation Date/Time:  Monday April 11 2023 08:29:02 EST Ventricular Rate:  69 PR Interval:  132 QRS Duration:  92 QT Interval:  380 QTC Calculation: 407 R Axis:   -7  Text Interpretation: Normal sinus rhythm Moderate voltage criteria for LVH, may be normal variant Borderline ECG When compared with ECG of 25-Aug-2019 06:33, Premature atrial complexes are no longer Present T wave inversion now evident in Inferior leads Nonspecific T wave abnormality, improved in Lateral leads Confirmed by Norman Herrlich ([id number]) on 04/11/2023 8:32:01 AM    Recent Labs: 10/25/2022: TSH 1.170 03/17/2023: ALT 16; BUN 12; Creatinine, Ser 1.11; Hemoglobin 15.2; Platelets 222; Potassium 5.4; Sodium 135  Recent Lipid Panel    Component Value Date/Time   CHOL 112 03/17/2023 1535   TRIG 88 03/17/2023 1535   HDL 38 (L) 03/17/2023 1535   CHOLHDL 2.9 03/17/2023 1535   LDLCALC 57 03/17/2023 1535    Physical Exam:    VS:  There were no vitals taken for this visit.    Wt Readings from Last 3 Encounters:  03/17/23 (!) 313 lb (142 kg)  10/25/22 (!) 327 lb 9.6 oz (148.6 kg)  04/27/22 (!)  339 lb (153.8 kg)     GEN:  Well nourished, well developed in no acute distress HEENT: Normal NECK: No JVD; No carotid bruits LYMPHATICS: No lymphadenopathy CARDIAC: Clearly able to be auscultated.  1/6 midsystolic ejection murmur only in the aortic area no aortic regurgitation RRR, RESPIRATORY:  Clear to auscultation without rales, wheezing or rhonchi  ABDOMEN: Soft, non-tender, non-distended MUSCULOSKELETAL:  No edema; No deformity  SKIN: Warm and dry NEUROLOGIC:  Alert and oriented x 3 PSYCHIATRIC:  Normal affect    Signed, Norman Herrlich, MD  04/11/2023 8:20 AM    [facility]

## 2023-04-11 ENCOUNTER — Other Ambulatory Visit: Payer: Self-pay | Admitting: Physician Assistant

## 2023-04-11 ENCOUNTER — Encounter: Payer: Self-pay | Admitting: Cardiology

## 2023-04-11 ENCOUNTER — Ambulatory Visit: Payer: 59 | Attending: Cardiology | Admitting: Cardiology

## 2023-04-11 ENCOUNTER — Encounter: Payer: Self-pay | Admitting: Physician Assistant

## 2023-04-11 VITALS — BP 130/84 | HR 69 | Ht 74.0 in | Wt 310.2 lb

## 2023-04-11 DIAGNOSIS — E7841 Elevated Lipoprotein(a): Secondary | ICD-10-CM

## 2023-04-11 DIAGNOSIS — I1A0 Resistant hypertension: Secondary | ICD-10-CM

## 2023-04-11 DIAGNOSIS — Z951 Presence of aortocoronary bypass graft: Secondary | ICD-10-CM | POA: Diagnosis not present

## 2023-04-11 DIAGNOSIS — I2510 Atherosclerotic heart disease of native coronary artery without angina pectoris: Secondary | ICD-10-CM | POA: Diagnosis not present

## 2023-04-11 DIAGNOSIS — N529 Male erectile dysfunction, unspecified: Secondary | ICD-10-CM

## 2023-04-11 DIAGNOSIS — Z953 Presence of xenogenic heart valve: Secondary | ICD-10-CM | POA: Diagnosis not present

## 2023-04-11 DIAGNOSIS — E7800 Pure hypercholesterolemia, unspecified: Secondary | ICD-10-CM

## 2023-04-11 MED ORDER — TADALAFIL 10 MG PO TABS
10.0000 mg | ORAL_TABLET | ORAL | 1 refills | Status: DC | PRN
Start: 2023-04-11 — End: 2023-09-28

## 2023-04-11 NOTE — Patient Instructions (Signed)

## 2023-06-18 ENCOUNTER — Other Ambulatory Visit: Payer: Self-pay | Admitting: Physician Assistant

## 2023-06-21 ENCOUNTER — Other Ambulatory Visit: Payer: Self-pay | Admitting: Physician Assistant

## 2023-06-21 DIAGNOSIS — E291 Testicular hypofunction: Secondary | ICD-10-CM

## 2023-06-21 DIAGNOSIS — R7989 Other specified abnormal findings of blood chemistry: Secondary | ICD-10-CM

## 2023-09-28 ENCOUNTER — Ambulatory Visit (INDEPENDENT_AMBULATORY_CARE_PROVIDER_SITE_OTHER): Payer: 59 | Admitting: Physician Assistant

## 2023-09-28 ENCOUNTER — Encounter: Payer: Self-pay | Admitting: Physician Assistant

## 2023-09-28 VITALS — BP 120/82 | HR 61 | Temp 97.5°F | Resp 16 | Ht 73.0 in | Wt 323.6 lb

## 2023-09-28 DIAGNOSIS — I10 Essential (primary) hypertension: Secondary | ICD-10-CM | POA: Diagnosis not present

## 2023-09-28 DIAGNOSIS — Z953 Presence of xenogenic heart valve: Secondary | ICD-10-CM

## 2023-09-28 DIAGNOSIS — R7989 Other specified abnormal findings of blood chemistry: Secondary | ICD-10-CM | POA: Diagnosis not present

## 2023-09-28 DIAGNOSIS — E782 Mixed hyperlipidemia: Secondary | ICD-10-CM

## 2023-09-28 DIAGNOSIS — Z125 Encounter for screening for malignant neoplasm of prostate: Secondary | ICD-10-CM

## 2023-09-28 DIAGNOSIS — R7303 Prediabetes: Secondary | ICD-10-CM

## 2023-09-28 DIAGNOSIS — N529 Male erectile dysfunction, unspecified: Secondary | ICD-10-CM

## 2023-09-28 DIAGNOSIS — K219 Gastro-esophageal reflux disease without esophagitis: Secondary | ICD-10-CM

## 2023-09-28 DIAGNOSIS — I2510 Atherosclerotic heart disease of native coronary artery without angina pectoris: Secondary | ICD-10-CM

## 2023-09-28 MED ORDER — FUROSEMIDE 20 MG PO TABS: 20.0000 mg | ORAL_TABLET | ORAL | 1 refills | Status: DC

## 2023-09-28 MED ORDER — CARVEDILOL 12.5 MG PO TABS: 12.5000 mg | ORAL_TABLET | Freq: Two times a day (BID) | ORAL | 1 refills | Status: DC

## 2023-09-28 MED ORDER — TELMISARTAN 40 MG PO TABS: 40.0000 mg | ORAL_TABLET | Freq: Every day | ORAL | 1 refills | Status: DC

## 2023-09-28 MED ORDER — SPIRONOLACTONE 25 MG PO TABS: 25.0000 mg | ORAL_TABLET | Freq: Every day | ORAL | 1 refills | Status: DC

## 2023-09-28 MED ORDER — OMEPRAZOLE 20 MG PO CPDR: 20.0000 mg | DELAYED_RELEASE_CAPSULE | Freq: Every day | ORAL | 1 refills | Status: DC

## 2023-09-28 MED ORDER — ROSUVASTATIN CALCIUM 20 MG PO TABS
20.0000 mg | ORAL_TABLET | Freq: Every day | ORAL | 1 refills | Status: DC
Start: 2023-09-28 — End: 2024-04-04

## 2023-09-28 MED ORDER — TADALAFIL 10 MG PO TABS: 10.0000 mg | ORAL_TABLET | ORAL | 1 refills | Status: DC | PRN

## 2023-09-28 NOTE — Progress Notes (Signed)
 Subjective:  Patient ID: Antonio Riley, male    DOB: 08-May-1966  Age: 58 y.o. MRN: [id number]  Chief Complaint  Patient presents with   Medical Management of Chronic Issues    HPI Pt presents for follow up of hypertension. The patient is tolerating medication well and voices no concerns or problems - currently on carvedilol  12.5mg  bid, aldactone  25, micardis  40mg  and lasix  20mg  Pt with history of CAD and has had aortic valve replacement.  He had that done along with double bypass graft in 2021.  He has seen Dr Sandee Crook in 11/24 and will follow yearly  Mixed hyperlipidemia  Pt presents with hyperlipidemia. Compliance with treatment has been good The patient is compliant with medications, maintains a low cholesterol diet , follows up as directed , and maintains an exercise regimen . The patient denies experiencing any hypercholesterolemia related symptoms. Currently taking crestor  20mg  qd  Pt with GERD - stable on prilosec 20mg  qd  Pt is due for colonoscopy but declines - he has had cologuard ordered but has not done it  Pt with low testosterone .  He is currently on 100mg  every other week - due to check labwork  Pt with sexual dysfunction and does use cialis   Pt is due for lung cancer screening but declines - he stopped smoking 5 months ago but is vaping  Pt with prediabetes - due for labwork     09/28/2023    8:21 AM 10/25/2022   10:51 AM 04/27/2022    7:50 AM 10/20/2021    2:22 PM  Depression screen PHQ 2/9  Decreased Interest 0 0 0 0  Down, Depressed, Hopeless 0 0 0 0  PHQ - 2 Score 0 0 0 0        10/20/2021    2:17 PM 04/27/2022    7:50 AM 10/25/2022   10:51 AM 03/17/2023    3:09 PM 09/28/2023    8:21 AM  Fall Risk  Falls in the past year? 0 0 0 0 0  Was there an injury with Fall? 0 0 0 0 0  Fall Risk Category Calculator 0 0 0 0 0  Fall Risk Category (Retired) Low Low     (RETIRED) Patient Fall Risk Level Low fall risk Low fall risk     Patient at Risk for Falls Due  to No Fall Risks No Fall Risks No Fall Risks No Fall Risks No Fall Risks  Fall risk Follow up Falls evaluation completed Falls evaluation completed Falls evaluation completed Falls evaluation completed     CONSTITUTIONAL: Negative for chills, fatigue, fever, unintentional weight gain and unintentional weight loss.  E/N/T: Negative for ear pain, nasal congestion and sore throat.  CARDIOVASCULAR: Negative for chest pain, dizziness, palpitations and pedal edema.  RESPIRATORY: Negative for recent cough and dyspnea.  GASTROINTESTINAL: Negative for abdominal pain, acid reflux symptoms, constipation, diarrhea, nausea and vomiting.  MSK: Negative for arthralgias and myalgias.  INTEGUMENTARY: Negative for rash.  NEUROLOGICAL: Negative for dizziness and headaches.  PSYCHIATRIC: Negative for sleep disturbance and to question depression screen.  Negative for depression, negative for anhedonia.       Current Outpatient Medications:    B-D 3CC LUER-LOK SYR 23GX1" 23G X 1" 3 ML MISC, Inject 1 each into the muscle every 14 (fourteen) days., Disp: 2 each, Rfl: 5   Calcium  Carbonate-Vitamin D  (CALCIUM  600/VITAMIN D  PO), Take 1 tablet by mouth daily., Disp: , Rfl:    EQ ASPIRIN  ADULT LOW DOSE 81 MG tablet, Take  1 tablet by mouth once daily, Disp: 90 tablet, Rfl: 0   testosterone  cypionate (DEPOTESTOTERONE CYPIONATE) 100 MG/ML injection, INJECT 1ML INTO THE MUSCLE EVERY 14 DAYS., Disp: 10 mL, Rfl: 0   carvedilol  (COREG ) 12.5 MG tablet, Take 1 tablet (12.5 mg total) by mouth 2 (two) times daily., Disp: 180 tablet, Rfl: 1   furosemide  (LASIX ) 20 MG tablet, Take 1 tablet (20 mg total) by mouth 3 (three) times a week., Disp: 60 tablet, Rfl: 1   omeprazole  (PRILOSEC) 20 MG capsule, Take 1 capsule (20 mg total) by mouth daily., Disp: 90 capsule, Rfl: 1   rosuvastatin  (CRESTOR ) 20 MG tablet, Take 1 tablet (20 mg total) by mouth daily., Disp: 90 tablet, Rfl: 1   spironolactone  (ALDACTONE ) 25 MG tablet, Take 1 tablet (25  mg total) by mouth daily., Disp: 90 tablet, Rfl: 1   tadalafil  (CIALIS ) 10 MG tablet, Take 1 tablet (10 mg total) by mouth every other day as needed for erectile dysfunction., Disp: 10 tablet, Rfl: 1   telmisartan  (MICARDIS ) 40 MG tablet, Take 1 tablet (40 mg total) by mouth daily., Disp: 90 tablet, Rfl: 1  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   SVG to OM, SVG to RCA, EVH via bilateral thighs   Swelling of lower leg    Objective:  PHYSICAL EXAM:   VS: BP 120/82   Pulse 61   Temp (!) 97.5 F (36.4 C)   Resp 16   Ht 6\' 1"  (1.854 m)   Wt (!) 323 lb 9.6 oz (146.8 kg)   SpO2 97%   BMI 42.69 kg/m   GEN: Well nourished, well developed, in no acute distress  Neck: no JVD or masses - no thyromegaly- no carotid bruits Cardiac: RRR; no murmurs, rubs, or gallops,no edema - Respiratory:  normal respiratory rate and pattern with no distress - normal breath sounds with no rales, rhonchi, wheezes or rubs  MS: no deformity or atrophy  Skin: warm and dry, no rash  Neuro:  Alert and Oriented x 3,- CN II-Xii grossly intact Psych: euthymic mood, appropriate affect and demeanor  Assessment & Plan:    Benign hypertension -     CBC with Differential/Platelet -     Comprehensive metabolic panel -     Lipid panel Continue meds Mixed hyperlipidemia -     CBC with Differential/Platelet -     Comprehensive metabolic panel -     Lipid panel Continue med Prediabetes -     Hemoglobin A1c Watch diet - decrease carbs/sugar Low testosterone  level in male -     Testosterone ,Free and Total -     Testosterone  Cypionate; Inject 1 mL (100 mg total)  into the muscle every 14 (fourteen) days. For IM use only  Dispense: 10 mL; Refill: 0  Coronary artery disease involving native coronary artery of native heart without angina pectoris -     Carvedilol ; Take 1 tablet (12.5 mg total) by mouth 2 (two) times daily.  Dispense: 180 tablet; Refill: 1 -     Rosuvastatin  Calcium ; Take 1 tablet (20 mg  total) by mouth daily.  Dispense: 90 tablet; Refill: 1 -     Spironolactone ; Take 1 tablet (25 mg total) by mouth daily.  Dispense: 90 tablet; Refill: 1 -     Telmisartan ; Take 1 tablet (40 mg total) by mouth daily.  Dispense: 90 tablet; Refill: 1 Follow up with Dr Sandee Crook as scheduled S/P aortic valve replacement with bioprosthetic valve + CABG x2 -     Furosemide ; Take 1 tablet (20 mg total) by mouth 3 (three) times a week.  Dispense: 60 tablet; Refill: 1  Gastroesophageal reflux disease, unspecified whether esophagitis present -     Omeprazole ; Take 1 capsule (20 mg total) by mouth daily.  Dispense: 90 capsule; Refill: 1  Male hypogonadism -     Testosterone  Cypionate; Inject 1 mL (100 mg total) into the muscle every 14 (fourteen) days. For IM use only  Dispense: 10 mL; Refill: 0  Pt declines colonoscopy and lung cancer screening CT   Follow-up: Return in about 6 months (around 03/30/2024) for chronic fasting follow-up.  An After Visit Summary was printed and given to the patient.  Anthonette Bastos [facility] [phone]

## 2023-09-29 ENCOUNTER — Telehealth: Payer: Self-pay

## 2023-09-29 DIAGNOSIS — E875 Hyperkalemia: Secondary | ICD-10-CM

## 2023-09-29 LAB — TSH: TSH: 1.72 u[IU]/mL (ref 0.450–4.500)

## 2023-09-29 LAB — CBC WITH DIFFERENTIAL/PLATELET
Basophils Absolute: 0.1 10*3/uL (ref 0.0–0.2)
Basos: 1 %
EOS (ABSOLUTE): 0.1 10*3/uL (ref 0.0–0.4)
Eos: 2 %
Hematocrit: 46.4 % (ref 37.5–51.0)
Hemoglobin: 15 g/dL (ref 13.0–17.7)
Immature Grans (Abs): 0 10*3/uL (ref 0.0–0.1)
Immature Granulocytes: 0 %
Lymphocytes Absolute: 1.2 10*3/uL (ref 0.7–3.1)
Lymphs: 18 %
MCH: 31.1 pg (ref 26.6–33.0)
MCHC: 32.3 g/dL (ref 31.5–35.7)
MCV: 96 fL (ref 79–97)
Monocytes Absolute: 0.7 10*3/uL (ref 0.1–0.9)
Monocytes: 11 %
Neutrophils Absolute: 4.4 10*3/uL (ref 1.4–7.0)
Neutrophils: 68 %
Platelets: 188 10*3/uL (ref 150–450)
RBC: 4.83 x10E6/uL (ref 4.14–5.80)
RDW: 12.2 % (ref 11.6–15.4)
WBC: 6.5 10*3/uL (ref 3.4–10.8)

## 2023-09-29 LAB — LIPID PANEL
Chol/HDL Ratio: 2.4 ratio (ref 0.0–5.0)
Cholesterol, Total: 130 mg/dL (ref 100–199)
HDL: 54 mg/dL (ref >39)
LDL Chol Calc (NIH): 59 mg/dL (ref 0–99)
Triglycerides: 86 mg/dL (ref 0–149)
VLDL Cholesterol Cal: 17 mg/dL (ref 5–40)

## 2023-09-29 LAB — TESTOSTERONE,FREE AND TOTAL
Testosterone, Free: 1 pg/mL — ABNORMAL LOW (ref 7.2–24.0)
Testosterone: 160 ng/dL — ABNORMAL LOW (ref 264–916)

## 2023-09-29 LAB — HEMOGLOBIN A1C
Est. average glucose Bld gHb Est-mCnc: 123 mg/dL
Hgb A1c MFr Bld: 5.9 % — ABNORMAL HIGH (ref 4.8–5.6)

## 2023-09-29 LAB — COMPREHENSIVE METABOLIC PANEL WITH GFR
ALT: 17 IU/L (ref 0–44)
AST: 19 IU/L (ref 0–40)
Albumin: 4.3 g/dL (ref 3.8–4.9)
Alkaline Phosphatase: 64 IU/L (ref 44–121)
BUN/Creatinine Ratio: 12 (ref 9–20)
BUN: 14 mg/dL (ref 6–24)
Bilirubin Total: 0.7 mg/dL (ref 0.0–1.2)
CO2: 26 mmol/L (ref 20–29)
Calcium: 9.4 mg/dL (ref 8.7–10.2)
Chloride: 100 mmol/L (ref 96–106)
Creatinine, Ser: 1.17 mg/dL (ref 0.76–1.27)
Globulin, Total: 3 g/dL (ref 1.5–4.5)
Glucose: 119 mg/dL — ABNORMAL HIGH (ref 70–99)
Potassium: 6.1 mmol/L (ref 3.5–5.2)
Sodium: 139 mmol/L (ref 134–144)
Total Protein: 7.3 g/dL (ref 6.0–8.5)
eGFR: 73 mL/min/{1.73_m2} (ref >59)

## 2023-09-29 LAB — PSA: Prostate Specific Ag, Serum: 0.6 ng/mL (ref 0.0–4.0)

## 2023-09-29 NOTE — Telephone Encounter (Signed)
 Made patient aware of labs, Patient stated that he taking his testosterone  monthly, he was told to stop taking it every two weeks and switch to monthly due to his levels being to high at one time. Patient will come in tomorrow and get his Potassium recheck, Lab person did not have a problem getting his blood when he was here.  CMP has been ordered

## 2023-09-29 NOTE — Addendum Note (Signed)
 Addended by: Tayvian Holycross L on: 09/29/2023 03:46 PM   Modules accepted: Orders

## 2023-09-29 NOTE — Telephone Encounter (Signed)
 Called back left message for patient to call office back. Copied from CRM [id number]. Topic: Clinical - Lab/Test Results >> Sep 29, 2023 12:58 PM Lizabeth Riggs wrote: Reason for CRM:  Mr. Crisman is calling Americus Scheurich March, CMA back about his lab results. I tried to transfer to clinic and they were at lunch. I did not see any notes about the results so please call him back at [phone]. Thanks

## 2023-09-29 NOTE — Telephone Encounter (Signed)
 Called patient to give him his lab results. Left message for patient to call office back.

## 2023-09-30 ENCOUNTER — Ambulatory Visit: Payer: Self-pay | Admitting: Physician Assistant

## 2023-09-30 ENCOUNTER — Other Ambulatory Visit: Payer: Self-pay | Admitting: Physician Assistant

## 2023-09-30 DIAGNOSIS — R7989 Other specified abnormal findings of blood chemistry: Secondary | ICD-10-CM

## 2023-09-30 DIAGNOSIS — E291 Testicular hypofunction: Secondary | ICD-10-CM

## 2023-09-30 MED ORDER — TESTOSTERONE CYPIONATE 100 MG/ML IM SOLN: 100.0000 mg | INTRAMUSCULAR | 0 refills | Status: DC

## 2023-10-11 ENCOUNTER — Other Ambulatory Visit: Payer: Self-pay | Admitting: Physician Assistant

## 2023-11-01 ENCOUNTER — Ambulatory Visit: Admitting: Podiatry

## 2023-11-01 ENCOUNTER — Ambulatory Visit (INDEPENDENT_AMBULATORY_CARE_PROVIDER_SITE_OTHER)

## 2023-11-01 ENCOUNTER — Encounter: Payer: Self-pay | Admitting: Podiatry

## 2023-11-01 DIAGNOSIS — M76821 Posterior tibial tendinitis, right leg: Secondary | ICD-10-CM

## 2023-11-01 DIAGNOSIS — M2141 Flat foot [pes planus] (acquired), right foot: Secondary | ICD-10-CM

## 2023-11-01 DIAGNOSIS — M2142 Flat foot [pes planus] (acquired), left foot: Secondary | ICD-10-CM | POA: Diagnosis not present

## 2023-11-01 DIAGNOSIS — M722 Plantar fascial fibromatosis: Secondary | ICD-10-CM

## 2023-11-01 MED ORDER — TRIAMCINOLONE ACETONIDE 10 MG/ML IJ SUSP
10.0000 mg | Freq: Once | INTRAMUSCULAR | Status: AC
  Administered 2023-11-01: 10 mg

## 2023-11-01 MED ORDER — MELOXICAM 15 MG PO TABS: 15.0000 mg | ORAL_TABLET | Freq: Every day | ORAL | 0 refills | Status: DC

## 2023-11-01 NOTE — Patient Instructions (Signed)
Posterior Tibial Tendinitis  Posterior tibial tendinitis is irritation of a tendon called the posterior tibial tendon. Your posterior tibial tendon is a cord-like tissue that connects bones of your lower leg and foot to a muscle that: Supports your arch. Helps you raise up on your toes. Helps you turn your foot down and in. This condition causes foot and ankle pain. It can also lead to a flat foot. What are the causes? This condition is most often caused by repeated stress to the tendon (overuse injury). It can also be caused by a sudden injury that stresses the tendon, such as landing on your foot after jumping or falling. What increases the risk? This condition is more likely to develop in: People who play a sport that involves putting a lot of pressure on the feet, such as: Basketball. Tennis. Soccer. Hockey. Runners. Females who are older than 57 years of age and are overweight. People with diabetes. People with decreased foot stability. People with flat feet. What are the signs or symptoms? Symptoms include: Pain in the inner ankle. Pain at the arch of your foot. Pain that gets worse with running, walking, or standing. Swelling on the inside of your ankle and foot. Weakness in your ankle or foot. Inability to stand up on tiptoe. Flattening of the arch of your foot. How is this diagnosed? This condition may be diagnosed based on: Your symptoms. Your medical history. A physical exam. Tests, such as: X-ray. MRI. Ultrasound. How is this treated? This condition may be treated by: Putting ice to the injured area. Taking NSAIDs, such as ibuprofen, to reduce pain and swelling. Wearing a special shoe or shoe insert to support your arch (orthotic). Having physical therapy. Replacing high-impact exercise with low-impact exercise, such as swimming or cycling. If your symptoms do not improve with these treatments, you may need to wear a splint, removable walking boot, or short  leg cast for 6-8 weeks to keep your foot and ankle still (immobilized). Follow these instructions at home: If you have a cast, splint, or boot: Keep it clean and dry. Check the skin around it every day. Tell your health care provider about any concerns. If you have a cast: Do not stick anything inside it to scratch your skin. Doing that increases your risk of infection. You may put lotion on dry skin around the edges of the cast. Do not put lotion on the skin underneath the cast. If you have a splint or boot: Wear it as told by your health care provider. Remove it only as told by your health care provider. Loosen it if your toes tingle, become numb, or turn cold and blue. Bathing Do not take baths, swim, or use a hot tub until your health care provider approves. Ask your health care provider if you may take showers. If your cast, splint, or boot is not waterproof: Do not let it get wet. Cover it with a waterproof covering while you take a bath or a shower. Managing pain and swelling   If directed, put ice on the injured area. If you have a removable splint or boot, remove it as told by your health care provider. Put ice in a plastic bag. Place a towel between your skin and the bag or between your cast and the bag. Leave the ice on for 20 minutes, 2-3 times a day. Move your toes often to reduce stiffness and swelling. Raise (elevate) the injured area above the level of your heart while you are sitting   or lying down. Activity Do not use the injured foot to support your body weight until your health care provider says that you can. Use crutches as told by your health care provider. Do not do activities that make pain or swelling worse. Ask your health care provider when it is safe to drive if you have a cast, splint, or boot on your foot. Return to your normal activities as told by your health care provider. Ask your health care provider what activities are safe for you. Do exercises as  told by your health care provider. General instructions Take over-the-counter and prescription medicines only as told by your health care provider. If you have an orthotic, use it as told by your health care provider. Keep all follow-up visits as told by your health care provider. This is important. How is this prevented? Wear footwear that is appropriate to your athletic activity. Avoid athletic activities that cause pain or swelling in your ankle or foot. Before being active, do range-of-motion and stretching exercises. If you develop pain or swelling while training, stop training. If you have pain or swelling that does not improve after a few days of rest, see your health care provider. If you start a new athletic activity, start gradually so you can build up your strength and flexibility. Contact a health care provider if: Your symptoms get worse. Your symptoms do not improve in 6-8 weeks. You develop new, unexplained symptoms. Your splint, boot, or cast gets damaged. Summary Posterior tibial tendinitis is irritation of a tendon called the posterior tibial tendon. This condition is most often caused by repeated stress to the tendon (overuse injury). This condition causes foot pain and ankle pain. It can also lead to a flat foot. This condition may be treated by not doing high-impact activities, applying ice, having physical therapy, wearing orthotics, and wearing a cast, splint, or boot if needed. This information is not intended to replace advice given to you by your health care provider. Make sure you discuss any questions you have with your health care provider. Document Revised: 08/29/2018 Document Reviewed: 07/06/2018 Elsevier Patient Education  2020 [employer]  Posterior Tibial Tendinitis Rehab Ask your health care provider which exercises are safe for you. Do exercises exactly as told by your health care provider and adjust them as directed. It is normal to feel mild  stretching, pulling, tightness, or discomfort as you do these exercises. Stop right away if you feel sudden pain or your pain gets worse. Do not begin these exercises until told by your health care provider. Stretching and range-of-motion exercises These exercises warm up your muscles and joints and improve the movement and flexibility in your ankle and foot. These exercises may also help to relieve pain. Standing wall calf stretch, knee straight   Stand with your hands against a wall. Extend your left / right leg behind you, and bend your front knee slightly. If directed, place a folded washcloth under the arch of your foot for support. Point the toes of your back foot slightly inward. Keeping your heels on the floor and your back knee straight, shift your weight toward the wall. Do not allow your back to arch. You should feel a gentle stretch in your upper left / right calf. Hold this position for 10 seconds. Repeat 10 times. Complete this exercise 2 times a day. Standing wall calf stretch, knee bent Stand with your hands against a wall. Extend your left / right leg behind you, and bend your front   knee slightly. If directed, place a folded washcloth under the arch of your foot for support. Point the toes of your back foot slightly inward. Unlock your back knee so it is bent. Keep your heels on the floor. You should feel a gentle stretch deep in your lower left / right calf. Hold this position for 10 seconds. Repeat 10 times. Complete this exercise 2 times a day. Strengthening exercises These exercises build strength and endurance in your ankle and foot. Endurance is the ability to use your muscles for a long time, even after they get tired. Ankle inversion with band Secure one end of a rubber exercise band or tubing to a fixed object, such as a table leg or a pole, that will stay still when the band is pulled. Loop the other end of the band around the middle of your left / right foot. Sit  on the floor facing the object with your left / right leg extended. The band or tube should be slightly tense when your foot is relaxed. Leading with your big toe, slowly bring your left / right foot and ankle inward, toward your other foot (inversion). Hold this position for 10 seconds. Slowly return your foot to the starting position. Repeat 10 times. Complete this exercise 2 times a day. Towel curls   Sit in a chair on a non-carpeted surface, and put your feet on the floor. Place a towel in front of your feet. Keeping your heel on the floor, put your left / right foot on the towel. Pull the towel toward you by grabbing the towel with your toes and curling them under. Keep your heel on the floor while you do this. Let your toes relax. Grab the towel with your toes again. Keep going until the towel is completely underneath your foot. Repeat 10 times. Complete this exercise 2 times a day. Balance exercise This exercise improves or maintains your balance. Balance is important in preventing falls. Single leg stand Without wearing shoes, stand near a railing or in a doorway. You may hold on to the railing or door frame as needed for balance. Stand on your left / right foot. Keep your big toe down on the floor and try to keep your arch lifted. If balancing in this position is too easy, try the exercise with your eyes closed or while standing on a pillow. Hold this position for 10 seconds. Repeat 10 times. Complete this exercise 2 times a day. This information is not intended to replace advice given to you by your health care provider. Make sure you discuss any questions you have with your health care provider.  

## 2023-11-01 NOTE — Progress Notes (Unsigned)
 Chief Complaint  Patient presents with   Plantar Fasciitis    Right foot, describing plantar fascitis with pain being worse in the mornings and after rest. He got an inj the last time he was here. This did help. Does not wear inserts, the arch is too high. I did tell him about breaking in the new shoes and arch supports.  Not diabetic and takes ASA     HPI: 58 y.o. male presents today with right foot pain.  Relates most the pain is along the arch of the right foot and locates area along PT tendon.  He has previously dealt with plantar fasciitis.  He reports trying inserts previously stating that the arches are too high.   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   SVG to OM, SVG to RCA, EVH via bilateral thighs   Swelling of lower leg     Past Surgical History:  Procedure Laterality Date   AORTIC VALVE REPLACEMENT N/A 08/24/2019   Procedure: AORTIC VALVE REPLACEMENT (AVR) USING INSPIRIS VALVE SIZE ;  Surgeon: Gardenia Jump, MD;  Location: [facility] OR;  Service: Open Heart Surgery;  Laterality: N/A;   CORONARY ARTERY BYPASS GRAFT N/A 08/24/2019   Procedure: CORONARY ARTERY BYPASS GRAFTING (CABG) x two, using bilateral leg greater saphenous veins harvested endoscopically;  Surgeon: Gardenia Jump, MD;  Location: [facility] OR;  Service: Open Heart Surgery;  Laterality: N/A;   FEMUR SURGERY     RIGHT/LEFT HEART CATH AND CORONARY ANGIOGRAPHY N/A 07/31/2019   Procedure: RIGHT/LEFT HEART CATH AND CORONARY ANGIOGRAPHY;  Surgeon: Arnoldo Lapping, MD;  Location[facility] INVASIVE CV LAB;  Service: Cardiovascular;  Laterality: N/A;   TEE WITHOUT  CARDIOVERSION N/A 08/24/2019   Procedure: TRANSESOPHAGEAL ECHOCARDIOGRAM (TEE);  Surgeon: Gardenia Jump, MD;  Location: [facility] OR;  Service: Open Heart Surgery;  Laterality: N/A;   WRIST SURGERY      Allergies  Allergen Reactions   Coricidin Hbp Cough-Cold [Chlorpheniramine-Dm] Hives    ROS denies nausea, vomiting, fever, chills, chest pain, shortness of breath   Physical Exam: There were no vitals filed for this visit.  General: The patient is alert and oriented x3 in no acute distress.  Dermatology: Skin is warm, dry bilateral lower extremities. Interspaces are clear of maceration and debris.  Somewhat brawny appearance of the legs.  Vascular: Palpable pedal pulses bilaterally. Capillary refill within normal limits  No erythema or calor.  Neurological: Light touch sensation grossly intact bilateral feet.   Musculoskeletal Exam: Bilateral flexible pes planovalgus noted.  Right side greater than left.  There is right sided ankle valgus.  Tenderness on palpation of PT tendon at level of insertion.  Muscle strength 5/5 for all major muscle groups.  No significant pain on palpation of plantar medial heel today.  Right sided pain with double heel rise.  Increased forefoot abduction with early heel rise on gait.  Radiographic Exam: Right foot 3 views weightbearing 11/01/2023 Normal osseous mineralization. Joint spaces preserved.  No fractures or acute osseous irregularities noted.  Pes planus foot type noted.  Significant talar head uncoverage noted.  Negative break in Meary's line.  Increased cuboid abduction angle noted.  Assessment/Plan of Care: 1. Pes planus of both feet   2. Posterior tibial tendon dysfunction (PTTD) of right lower extremity      Meds ordered this encounter  Medications   triamcinolone  acetonide (KENALOG ) 10 MG/ML injection 10 mg   meloxicam  (MOBIC ) 15 MG tablet    Sig: Take 1 tablet (15 mg total) by mouth daily.    Dispense:  30 tablet    Refill:  0    None  Discussed the etiology, pathomechanics and treatment options in detail with the patient, we also reviewed today's radiographs in detail.  We discussed how pes planus deformity without pain or functional limitation is quite common and often does not require any treatment.  However when pain or functional limitation arises, treatment with nonsurgical therapy is our first line with stretching, physical therapy, and supportive orthoses.  Also discussed that when these treatments fail patients often do well with surgical treatment of these deformities.  We also discussed the impact of the deformity on soft tissue and the joints and development of arthritis over time.  Today I recommended that we begin course of oral NSAID therapy with a course of meloxicam  sent to patient's pharmacy.  Strongly recommend the use of a quality over-the-counter orthotic and use of a good supportive shoe.  Recommendations given the patient.  Did discuss over-the-counter ankle bracing stabilize the ankle valgus.  Proceeded with corticosteroid injection to right PT tendon following verbal consent.  Alcohol skin prep.  Injected 0.5 cc of half percent Marcaine mixed with 10 mg Kenalog  to PT tendon right side approaching insertion on navicular tuberosity.  Band-Aid applied.  Patient tolerated this well.  Stretching regimen discussed at length.  Follow-up in about 3 weeks.   Camara Rosander L. Lunda Salines, AACFAS [facility]     [address] [place], [place] [address]                Office [phone]  Fax [phone]

## 2023-11-02 ENCOUNTER — Other Ambulatory Visit: Payer: Self-pay | Admitting: Family Medicine

## 2023-11-22 ENCOUNTER — Ambulatory Visit: Admitting: Podiatry

## 2024-01-05 ENCOUNTER — Other Ambulatory Visit

## 2024-01-29 ENCOUNTER — Other Ambulatory Visit: Payer: Self-pay | Admitting: Physician Assistant

## 2024-03-13 ENCOUNTER — Other Ambulatory Visit: Payer: Self-pay | Admitting: Physician Assistant

## 2024-03-13 DIAGNOSIS — R7989 Other specified abnormal findings of blood chemistry: Secondary | ICD-10-CM

## 2024-03-13 DIAGNOSIS — E291 Testicular hypofunction: Secondary | ICD-10-CM

## 2024-03-23 ENCOUNTER — Telehealth: Payer: Self-pay

## 2024-03-23 NOTE — Telephone Encounter (Signed)
 Called patient need to reschedule appointment due to provider being out of the office on 04/06/24. Left message for patient to call back and reschedule appointment.

## 2024-04-04 ENCOUNTER — Encounter: Payer: Self-pay | Admitting: Physician Assistant

## 2024-04-04 ENCOUNTER — Ambulatory Visit: Admitting: Physician Assistant

## 2024-04-04 VITALS — BP 140/90 | HR 65 | Temp 98.1°F | Resp 18 | Ht 73.0 in | Wt 330.6 lb

## 2024-04-04 DIAGNOSIS — I10 Essential (primary) hypertension: Secondary | ICD-10-CM | POA: Diagnosis not present

## 2024-04-04 DIAGNOSIS — E782 Mixed hyperlipidemia: Secondary | ICD-10-CM | POA: Diagnosis not present

## 2024-04-04 DIAGNOSIS — R7303 Prediabetes: Secondary | ICD-10-CM

## 2024-04-04 DIAGNOSIS — Z953 Presence of xenogenic heart valve: Secondary | ICD-10-CM

## 2024-04-04 DIAGNOSIS — K219 Gastro-esophageal reflux disease without esophagitis: Secondary | ICD-10-CM

## 2024-04-04 DIAGNOSIS — N529 Male erectile dysfunction, unspecified: Secondary | ICD-10-CM

## 2024-04-04 DIAGNOSIS — R7989 Other specified abnormal findings of blood chemistry: Secondary | ICD-10-CM

## 2024-04-04 DIAGNOSIS — I2510 Atherosclerotic heart disease of native coronary artery without angina pectoris: Secondary | ICD-10-CM

## 2024-04-04 DIAGNOSIS — M25571 Pain in right ankle and joints of right foot: Secondary | ICD-10-CM

## 2024-04-04 MED ORDER — SPIRONOLACTONE 25 MG PO TABS: 25.0000 mg | ORAL_TABLET | Freq: Every day | ORAL | 1 refills | Status: AC

## 2024-04-04 MED ORDER — CARVEDILOL 25 MG PO TABS: 25.0000 mg | ORAL_TABLET | Freq: Two times a day (BID) | ORAL | Status: DC

## 2024-04-04 MED ORDER — MELOXICAM 15 MG PO TABS: 15.0000 mg | ORAL_TABLET | Freq: Every day | ORAL | 1 refills | Status: AC

## 2024-04-04 MED ORDER — TESTOSTERONE CYPIONATE 100 MG/ML IM SOLN: 100.0000 mg | INTRAMUSCULAR | 1 refills | Status: AC

## 2024-04-04 MED ORDER — TELMISARTAN 40 MG PO TABS: 40.0000 mg | ORAL_TABLET | Freq: Every day | ORAL | 1 refills | Status: AC

## 2024-04-04 MED ORDER — OMEPRAZOLE 20 MG PO CPDR
20.0000 mg | DELAYED_RELEASE_CAPSULE | Freq: Every day | ORAL | 1 refills | Status: AC
Start: 2024-04-04 — End: ?

## 2024-04-04 MED ORDER — BD LUER-LOK SYRINGE 23G X 1" 3 ML MISC: 5 refills | Status: DC

## 2024-04-04 MED ORDER — TADALAFIL 10 MG PO TABS: 10.0000 mg | ORAL_TABLET | ORAL | 5 refills | Status: AC | PRN

## 2024-04-04 MED ORDER — FUROSEMIDE 20 MG PO TABS: 20.0000 mg | ORAL_TABLET | ORAL | 1 refills | Status: AC

## 2024-04-04 MED ORDER — ROSUVASTATIN CALCIUM 20 MG PO TABS
20.0000 mg | ORAL_TABLET | Freq: Every day | ORAL | 1 refills | Status: AC
Start: 2024-04-04 — End: ?

## 2024-04-04 NOTE — Progress Notes (Signed)
 Subjective:  Patient ID: Antonio Riley, male    DOB: [birth year]  Age: 58 y.o. MRN: [id number]  Chief Complaint  Patient presents with   Medical Management of Chronic Issues    HPI Pt presents for follow up of hypertension. The patient is tolerating medication well and voices no concerns or problems - currently on carvedilol  12.5mg  bid, aldactone  25, micardis  40mg  and lasix  20mg  Pt with history of CAD and has had aortic valve replacement.  He had that done along with double bypass graft in 2021.  He follows with cardiologist Dr Monetta yearly -   Mixed hyperlipidemia  Pt presents with hyperlipidemia. Compliance with treatment has been good The patient is compliant with medications, maintains a low cholesterol diet , follows up as directed , and maintains an exercise regimen . The patient denies experiencing any hypercholesterolemia related symptoms. Currently taking crestor  20mg  qd  Pt with GERD - stable on prilosec 20mg  qd  Pt is due for colonoscopy but declines - he has had cologuard ordered but has not done it  Pt with low testosterone .  He is currently on 100mg  every other week - due to check labwork- requests refills  Pt with sexual dysfunction and does use cialis   Pt is due for lung cancer screening but declines - he stopped smoking about a year ago but is vaping  Pt with prediabetes - due for labwork  Pt following ortho for right ankle pain - requests refill of meloxicam      04/04/2024    1:14 PM 09/28/2023    8:21 AM 10/25/2022   10:51 AM 04/27/2022    7:50 AM 10/20/2021    2:22 PM  Depression screen PHQ 2/9  Decreased Interest 0 0 0 0 0  Down, Depressed, Hopeless 0 0 0 0 0  PHQ - 2 Score 0 0 0 0 0        04/27/2022    7:50 AM 10/25/2022   10:51 AM 03/17/2023    3:09 PM 09/28/2023    8:21 AM 04/04/2024    1:14 PM  Fall Risk  Falls in the past year? 0 0 0 0 0  Was there an injury with Fall? 0 0 0 0 0  Fall Risk Category Calculator 0 0 0 0 0  Fall Risk  Category (Retired) Low       (RETIRED) Patient Fall Risk Level Low fall risk       Patient at Risk for Falls Due to No Fall Risks No Fall Risks No Fall Risks No Fall Risks No Fall Risks  Fall risk Follow up Falls evaluation completed  Falls evaluation completed Falls evaluation completed  Falls evaluation completed     Data saved with a previous flowsheet row definition    CONSTITUTIONAL: Negative for chills, fatigue, fever, unintentional weight gain and unintentional weight loss.  E/N/T: Negative for ear pain, nasal congestion and sore throat.  CARDIOVASCULAR: Negative for chest pain, dizziness, palpitations and pedal edema.  RESPIRATORY: Negative for recent cough and dyspnea.  GASTROINTESTINAL: Negative for abdominal pain, acid reflux symptoms, constipation, diarrhea, nausea and vomiting.  MSK: few weeks ago sprained ankle - having intermittent pain - sees ortho INTEGUMENTARY: Negative for rash.  NEUROLOGICAL: Negative for dizziness and headaches.  PSYCHIATRIC: Negative for sleep disturbance and to question depression screen.  Negative for depression, negative for anhedonia.        Current Outpatient Medications:    aspirin  EC (EQ ASPIRIN  ADULT LOW DOSE) 81 MG tablet, Take 1 tablet  by mouth once daily, Disp: 90 tablet, Rfl: 1   Calcium  Carbonate-Vitamin D  (CALCIUM  600/VITAMIN D  PO), Take 1 tablet by mouth daily., Disp: , Rfl:    carvedilol  (COREG ) 25 MG tablet, Take 1 tablet (25 mg total) by mouth 2 (two) times daily with a meal., Disp: , Rfl:    B-D 3CC LUER-LOK SYR 23GX1 23G X 1 3 ML MISC, Use one every 2 weeks with testosterone  injection, Disp: 100 each, Rfl: 5   furosemide  (LASIX ) 20 MG tablet, Take 1 tablet (20 mg total) by mouth 3 (three) times a week., Disp: 60 tablet, Rfl: 1   meloxicam  (MOBIC ) 15 MG tablet, Take 1 tablet (15 mg total) by mouth daily., Disp: 90 tablet, Rfl: 1   omeprazole  (PRILOSEC) 20 MG capsule, Take 1 capsule (20 mg total) by mouth daily., Disp: 90 capsule,  Rfl: 1   rosuvastatin  (CRESTOR ) 20 MG tablet, Take 1 tablet (20 mg total) by mouth daily., Disp: 90 tablet, Rfl: 1   spironolactone  (ALDACTONE ) 25 MG tablet, Take 1 tablet (25 mg total) by mouth daily., Disp: 90 tablet, Rfl: 1   tadalafil  (CIALIS ) 10 MG tablet, Take 1 tablet (10 mg total) by mouth every other day as needed for erectile dysfunction., Disp: 10 tablet, Rfl: 5   telmisartan  (MICARDIS ) 40 MG tablet, Take 1 tablet (40 mg total) by mouth daily., Disp: 90 tablet, Rfl: 1   testosterone  cypionate (DEPOTESTOTERONE CYPIONATE) 100 MG/ML injection, Inject 1 mL (100 mg total) into the muscle every 14 (fourteen) days. 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   SVG to OM, SVG to RCA, EVH via bilateral thighs   Swelling of lower leg    Objective:  PHYSICAL EXAM:   VS: BP (!) 140/90   Pulse 65   Temp 98.1 F (36.7 C) (Temporal)   Resp 18   Ht 6' 1 (1.854 m)   Wt (!) 330 lb 9.6 oz (150 kg)   SpO2 97%   BMI 43.62 kg/m   GEN: Well nourished, well developed, in no acute distress  Cardiac: RRR; no murmurs, rubs, or gallops,no edema -  Respiratory:  normal respiratory rate and pattern with no distress - normal breath sounds with no rales, rhonchi, wheezes or rubs MS: no deformity or atrophy  Skin: warm and dry, no rash  Neuro:  Alert and Oriented x 3, - CN II-Xii grossly intact Psych: euthymic mood, appropriate affect and demeanor   Assessment & Plan:    Benign hypertension -     CBC with Differential/Platelet -     Comprehensive metabolic panel -     Lipid panel Increase carvedilol  to 25mg   bid Mixed hyperlipidemia -     CBC with Differential/Platelet -     Comprehensive metabolic panel -     Lipid panel Continue med Watch diet  Prediabetes -     Hemoglobin A1c Watch diet - decrease carbs/sugar Low testosterone  level in male -     Testosterone ,Free and Total -     Testosterone  Cypionate; Inject  1 mL (100 mg total) into the muscle every 14 (fourteen) days. For IM use only  Dispense: 10 mL; Refill: 0  Coronary artery disease involving native coronary artery of native heart without angina pectoris -     Carvedilol ; Take 1 tablet (12.5 mg total) by mouth 2 (two) times daily.  Dispense: 180 tablet; Refill: 1 -     Rosuvastatin  Calcium ; Take 1 tablet (20 mg total) by mouth daily.  Dispense: 90 tablet; Refill: 1 -     Spironolactone ; Take 1 tablet (25 mg total) by mouth daily.  Dispense: 90 tablet; Refill: 1 -     Telmisartan ; Take 1 tablet (40 mg total) by mouth daily.  Dispense: 90 tablet; Refill: 1 Follow up with Dr Monetta as scheduled S/P aortic valve replacement with bioprosthetic valve + CABG x2 -     Furosemide ; Take 1 tablet (20 mg total) by mouth 3 (three) times a week.  Dispense: 60 tablet; Refill: 1  Gastroesophageal reflux disease, unspecified whether esophagitis present -     Omeprazole ; Take 1 capsule (20 mg total) by mouth daily.  Dispense: 90 capsule; Refill: 1  Erectile Dysfunction Continue cialis   Right ankle pain Rx meloxicam  15mg  qd  Pt declines colonoscopy and lung cancer screening CT   Follow-up: Return in about 6 months (around 10/02/2024) for chronic fasting follow-up.  An After Visit Summary was printed and given to the patient.  CAMIE JONELLE NICHOLAUS DEVONNA [facility] [phone]

## 2024-04-06 ENCOUNTER — Ambulatory Visit: Admitting: Physician Assistant

## 2024-04-06 LAB — CBC WITH DIFFERENTIAL/PLATELET
Basophils Absolute: 0 x10E3/uL (ref 0.0–0.2)
Basos: 1 %
EOS (ABSOLUTE): 0.1 x10E3/uL (ref 0.0–0.4)
Eos: 2 %
Hematocrit: 50.3 % (ref 37.5–51.0)
Hemoglobin: 16.3 g/dL (ref 13.0–17.7)
Immature Grans (Abs): 0 x10E3/uL (ref 0.0–0.1)
Immature Granulocytes: 0 %
Lymphocytes Absolute: 2 x10E3/uL (ref 0.7–3.1)
Lymphs: 26 %
MCH: 30.3 pg (ref 26.6–33.0)
MCHC: 32.4 g/dL (ref 31.5–35.7)
MCV: 94 fL (ref 79–97)
Monocytes Absolute: 0.9 x10E3/uL (ref 0.1–0.9)
Monocytes: 11 %
Neutrophils Absolute: 4.8 x10E3/uL (ref 1.4–7.0)
Neutrophils: 60 %
Platelets: 238 x10E3/uL (ref 150–450)
RBC: 5.38 x10E6/uL (ref 4.14–5.80)
RDW: 12 % (ref 11.6–15.4)
WBC: 7.9 x10E3/uL (ref 3.4–10.8)

## 2024-04-06 LAB — COMPREHENSIVE METABOLIC PANEL WITH GFR
ALT: 15 IU/L (ref 0–44)
AST: 22 IU/L (ref 0–40)
Albumin: 4.6 g/dL (ref 3.8–4.9)
Alkaline Phosphatase: 65 IU/L (ref 47–123)
BUN/Creatinine Ratio: 14 (ref 9–20)
BUN: 17 mg/dL (ref 6–24)
Bilirubin Total: 1 mg/dL (ref 0.0–1.2)
CO2: 24 mmol/L (ref 20–29)
Calcium: 10 mg/dL (ref 8.7–10.2)
Chloride: 92 mmol/L — ABNORMAL LOW (ref 96–106)
Creatinine, Ser: 1.2 mg/dL (ref 0.76–1.27)
Globulin, Total: 3 g/dL (ref 1.5–4.5)
Glucose: 102 mg/dL — ABNORMAL HIGH (ref 70–99)
Potassium: 5.1 mmol/L (ref 3.5–5.2)
Sodium: 132 mmol/L — ABNORMAL LOW (ref 134–144)
Total Protein: 7.6 g/dL (ref 6.0–8.5)
eGFR: 71 mL/min/1.73 (ref >59)

## 2024-04-06 LAB — HEMOGLOBIN A1C
Est. average glucose Bld gHb Est-mCnc: 123 mg/dL
Hgb A1c MFr Bld: 5.9 % — ABNORMAL HIGH (ref 4.8–5.6)

## 2024-04-06 LAB — LIPID PANEL
Chol/HDL Ratio: 4.5 ratio (ref 0.0–5.0)
Cholesterol, Total: 194 mg/dL (ref 100–199)
HDL: 43 mg/dL (ref >39)
LDL Chol Calc (NIH): 129 mg/dL — ABNORMAL HIGH (ref 0–99)
Triglycerides: 121 mg/dL (ref 0–149)
VLDL Cholesterol Cal: 22 mg/dL (ref 5–40)

## 2024-04-06 LAB — TESTOSTERONE,FREE AND TOTAL
Testosterone, Free: 10.4 pg/mL (ref 7.2–24.0)
Testosterone: 791 ng/dL (ref 264–916)

## 2024-04-06 LAB — TSH: TSH: 1.75 u[IU]/mL (ref 0.450–4.500)

## 2024-04-09 ENCOUNTER — Ambulatory Visit: Payer: Self-pay | Admitting: Physician Assistant

## 2024-04-09 DIAGNOSIS — L932 Other local lupus erythematosus: Secondary | ICD-10-CM | POA: Insufficient documentation

## 2024-04-09 NOTE — Progress Notes (Unsigned)
 Cardiology Office Note:    Date:  04/10/2024   ID:  Antonio Riley, DOB 08/22/1965, MRN [id number]  PCP:  Nicholaus Credit, PA-C  Cardiologist:  Redell Leiter, MD    Referring MD: Nicholaus Credit, PA-C    ASSESSMENT:    1. S/P aortic valve replacement with bioprosthetic valve   2. Coronary artery disease involving native coronary artery of native heart without angina pectoris   3. S/P CABG x 2   4. Resistant hypertension   5. Elevated lipoprotein A level    PLAN:    In order of problems listed above:  [name] continues to do well following his aortic valve replacement and CABG [place]  Heart Association class I Continue medical therapy including low-dose aspirin  he is back on his high intensity statin labs are followed by his PCP and his current antihypertensive agents If he has ongoing muscle symptoms I did use a low-dose pravastatin along with Demaris it would give an equal response If statin intolerance picked up until I casted her I think Repatha  would be preferable with his hyper LP(a) Pressure is now well-controlled and continue his combination furosemide  carvedilol  spironolactone  and ARB   Next appointment: He will the 5-year echocardiogram done next April and see me in the office 1 year   Medication Adjustments/Labs and Tests Ordered: Current medicines are reviewed at length with the patient today.  Concerns regarding medicines are outlined above.  Orders Placed This Encounter  Procedures   EKG 12-Lead   No orders of the defined types were placed in this encounter.    History of Present Illness:    Antonio Riley is a 58 y.o. male with a hx of severe symptomatic aortic stenosis with surgical AVR bovine pericardial tissue valve and CABG April 2021 postoperative atrial fibrillation resistant hypertension and hyperlipidemia with elevated LP little way last seen 04/11/2023.  His lipid profile 5 days ago abruptly shows elevated LDL previously in the range of 55-59 now  129.  Compliance with diet, lifestyle and medications: Yes  He acknowledges that he stopped taking his rosuvastatin  he had cramps at nighttime and he is committed to taking it I told him if it is a problem we could use a lower intensity statin pravastatin along with Zetia or PCSK9 inhibitor He follows endocarditis prophylaxis with his amoxicillin  Remains very vigorous and active and has no cardiovascular symptoms of shortness of breath edema chest pain palpitation or syncope He has had no fever or chill Past Medical History:  Diagnosis Date   Anginal pain    Aortic stenosis    BMI 40.0-44.9, adult (HCC)    Coronary artery disease    Cutaneous lupus erythematosus    Gastroesophageal reflux disease 04/27/2022   Heart murmur    Hypertension    Knee pain 03/15/2018   Low testosterone  level in male 03/17/2023   Male hypogonadism 04/27/2022   Mixed hyperlipidemia 04/27/2022   Nicotine dependence 04/27/2022   Prediabetes 04/27/2022   Resistant hypertension 03/15/2018   S/P aortic valve replacement with bioprosthetic valve 08/24/2019   25 mm Edwards Inspiris Resilia stented bovine pericardial tissue valve   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   carvedilol  (COREG ) 25 MG tablet Take 1 tablet (25 mg total) by mouth 2 (two) times daily with a meal.   furosemide  (LASIX ) 20 MG tablet Take 1 tablet (20 mg total) by mouth 3 (three) times a week.   meloxicam  (MOBIC ) 15 MG tablet Take 1 tablet (15 mg total) by mouth daily.   omeprazole  (PRILOSEC) 20 MG capsule Take 1 capsule (20 mg total) by mouth daily.    rosuvastatin  (CRESTOR ) 20 MG tablet Take 1 tablet (20 mg total) by mouth daily.   spironolactone  (ALDACTONE ) 25 MG tablet Take 1 tablet (25 mg total) by mouth daily.   tadalafil  (CIALIS ) 10 MG tablet Take 1 tablet (10 mg total) by mouth every other day as needed for erectile dysfunction.   telmisartan  (MICARDIS ) 40 MG tablet Take 1 tablet (40 mg total) by mouth daily.   testosterone  cypionate (DEPOTESTOTERONE CYPIONATE) 100 MG/ML injection Inject 1 mL (100 mg total) into the muscle every 14 (fourteen) days. For IM use only      EKGs/Labs/Other Studies Reviewed:    The following studies were reviewed today:  EKG Interpretation Date/Time:  Tuesday April 10 2024 10:45:39 EST Ventricular Rate:  60 PR Interval:  130 QRS Duration:  94 QT Interval:  390 QTC Calculation: 390 R Axis:   6  Text Interpretation: Normal sinus rhythm T wave abnormality, consider inferior ischemia When compared with ECG of 11-Apr-2023 08:29, No significant change was found Confirmed by Monetta Rogue ([id number]) on 04/10/2024 10:48:16 AM     EKG Interpretation Date/Time:  Tuesday April 10 2024 10:45:39 EST Ventricular Rate:  60 PR Interval:  130 QRS Duration:  94 QT Interval:  390 QTC Calculation: 390 R Axis:   6  Text Interpretation: Normal sinus rhythm T wave abnormality, consider inferior ischemia When compared with ECG of 11-Apr-2023 08:29, No significant change was found Confirmed by Monetta Rogue ([id number]) on 04/10/2024 10:48:16 AM   Recent Labs: 04/04/2024: ALT 15; BUN 17; Creatinine, Ser 1.20; Hemoglobin 16.3; Platelets 238; Potassium 5.1; Sodium 132; TSH 1.750  Recent Lipid Panel    Component Value Date/Time   CHOL 194 04/04/2024 1335   TRIG 121 04/04/2024 1335   HDL 43 04/04/2024 1335   CHOLHDL 4.5 04/04/2024 1335   LDLCALC 129 (H) 04/04/2024 1335    Physical Exam:    VS:  BP 136/80   Pulse 60   Ht 6' 1 (1.854 m)   Wt (!) 331 lb 3.2 oz (150.2 kg)   SpO2 95%   BMI 43.70 kg/m     Wt  Readings from Last 3 Encounters:  04/10/24 (!) 331 lb 3.2 oz (150.2 kg)  04/04/24 (!) 330 lb 9.6 oz (150 kg)  09/28/23 (!) 323 lb 9.6 oz (146.8 kg)     GEN: Obese BMI exceeds 40 well nourished, well developed in no acute distress HEENT: Normal NECK: No JVD; No carotid bruits LYMPHATICS: No lymphadenopathy CARDIAC: RRR, no murmurs, rubs, gallops normal exam for aortic bioprosthesis RESPIRATORY:  Clear to auscultation without rales, wheezing or rhonchi  ABDOMEN: Soft, non-tender, non-distended MUSCULOSKELETAL:  No edema; No deformity  SKIN: Warm and dry NEUROLOGIC:  Alert and oriented x 3 PSYCHIATRIC:  Normal affect    Signed, Rogue Monetta, MD  04/10/2024 11:04 AM    [facility]

## 2024-04-10 ENCOUNTER — Ambulatory Visit: Attending: Cardiology | Admitting: Cardiology

## 2024-04-10 ENCOUNTER — Encounter: Payer: Self-pay | Admitting: Cardiology

## 2024-04-10 VITALS — BP 136/80 | HR 60 | Ht 73.0 in | Wt 331.2 lb

## 2024-04-10 DIAGNOSIS — Z953 Presence of xenogenic heart valve: Secondary | ICD-10-CM

## 2024-04-10 DIAGNOSIS — Z951 Presence of aortocoronary bypass graft: Secondary | ICD-10-CM | POA: Diagnosis not present

## 2024-04-10 DIAGNOSIS — I2510 Atherosclerotic heart disease of native coronary artery without angina pectoris: Secondary | ICD-10-CM | POA: Diagnosis not present

## 2024-04-10 DIAGNOSIS — I1A0 Resistant hypertension: Secondary | ICD-10-CM

## 2024-04-10 DIAGNOSIS — E7841 Elevated Lipoprotein(a): Secondary | ICD-10-CM

## 2024-04-10 NOTE — Addendum Note (Signed)
 Addended by: SHERRE ADE I on: 04/10/2024 11:20 AM   Modules accepted: Orders

## 2024-04-10 NOTE — Patient Instructions (Signed)
 Medication Instructions:  Your physician recommends that you continue on your current medications as directed. Please refer to the Current Medication list given to you today.  *If you need a refill on your cardiac medications before your next appointment, please call your pharmacy*  Lab Work: None If you have labs (blood work) drawn today and your tests are completely normal, you will receive your results only by: MyChart Message (if you have MyChart) OR A paper copy in the mail If you have any lab test that is abnormal or we need to change your treatment, we will call you to review the results.  Testing/Procedures: Your physician has requested that you have an echocardiogram. Echocardiography is a painless test that uses sound waves to create images of your heart. It provides your doctor with information about the size and shape of your heart and how well your heart's chambers and valves are working. This procedure takes approximately one hour. There are no restrictions for this procedure. Please do NOT wear cologne, perfume, aftershave, or lotions (deodorant is allowed). Please arrive 15 minutes prior to your appointment time.  Please note: We ask at that you not bring children with you during ultrasound (echo/ vascular) testing. Due to room size and safety concerns, children are not allowed in the ultrasound rooms during exams. Our front office staff cannot provide observation of children in our lobby area while testing is being conducted. An adult accompanying a patient to their appointment will only be allowed in the ultrasound room at the discretion of the ultrasound technician under special circumstances. We apologize for any inconvenience.   Follow-Up: At [facility], you and your health needs are our priority.  As part of our continuing mission to provide you with exceptional heart care, our providers are all part of one team.  This team includes your primary Cardiologist  (physician) and Advanced Practice Providers or APPs (Physician Assistants and Nurse Practitioners) who all work together to provide you with the care you need, when you need it.  Your next appointment:   1 year(s)  Provider:   Redell Leiter, MD    We recommend signing up for the patient portal called MyChart.  Sign up information is provided on this After Visit Summary.  MyChart is used to connect with patients for Virtual Visits (Telemedicine).  Patients are able to view lab/test results, encounter notes, upcoming appointments, etc.  Non-urgent messages can be sent to your provider as well.   To learn more about what you can do with MyChart, go to [email or web address].   Other Instruction: None

## 2024-04-13 ENCOUNTER — Other Ambulatory Visit: Payer: Self-pay | Admitting: Physician Assistant

## 2024-04-13 DIAGNOSIS — R7989 Other specified abnormal findings of blood chemistry: Secondary | ICD-10-CM

## 2024-05-19 ENCOUNTER — Other Ambulatory Visit: Payer: Self-pay | Admitting: Physician Assistant

## 2024-05-19 DIAGNOSIS — I2510 Atherosclerotic heart disease of native coronary artery without angina pectoris: Secondary | ICD-10-CM

## 2024-05-21 ENCOUNTER — Other Ambulatory Visit: Payer: Self-pay | Admitting: Physician Assistant

## 2024-05-21 DIAGNOSIS — I10 Essential (primary) hypertension: Secondary | ICD-10-CM

## 2024-05-21 MED ORDER — CARVEDILOL 25 MG PO TABS: 25.0000 mg | ORAL_TABLET | Freq: Two times a day (BID) | ORAL | 0 refills | Status: AC

## 2024-06-01 ENCOUNTER — Encounter: Payer: Self-pay | Admitting: Physician Assistant

## 2024-10-03 ENCOUNTER — Ambulatory Visit: Admitting: Physician Assistant
